# Patient Record
Sex: Female | Born: 1970 | Hispanic: No | Marital: Single | Smoking: Never smoker
Health system: Southern US, Community
[De-identification: ages and names within clinical notes are randomized; demographics above are authoritative.]

## PROBLEM LIST (undated history)

## (undated) DIAGNOSIS — F79 Unspecified intellectual disabilities: Secondary | ICD-10-CM

## (undated) DIAGNOSIS — E079 Disorder of thyroid, unspecified: Secondary | ICD-10-CM

## (undated) DIAGNOSIS — J309 Allergic rhinitis, unspecified: Secondary | ICD-10-CM

## (undated) DIAGNOSIS — E876 Hypokalemia: Secondary | ICD-10-CM

## (undated) DIAGNOSIS — Q8711 Prader-Willi syndrome: Secondary | ICD-10-CM

## (undated) DIAGNOSIS — G4730 Sleep apnea, unspecified: Secondary | ICD-10-CM

## (undated) DIAGNOSIS — E785 Hyperlipidemia, unspecified: Secondary | ICD-10-CM

## (undated) HISTORY — PX: THYROIDECTOMY: SHX17

## (undated) HISTORY — PX: HERNIA REPAIR: SHX51

---

## 1998-04-23 ENCOUNTER — Other Ambulatory Visit: Admission: RE | Admit: 1998-04-23 | Discharge: 1998-04-23 | Payer: Self-pay | Admitting: Family Medicine

## 1998-12-11 ENCOUNTER — Emergency Department (HOSPITAL_COMMUNITY): Admission: EM | Admit: 1998-12-11 | Discharge: 1998-12-11 | Payer: Self-pay | Admitting: Emergency Medicine

## 1998-12-11 ENCOUNTER — Encounter: Payer: Self-pay | Admitting: Emergency Medicine

## 1999-02-22 ENCOUNTER — Encounter: Payer: Self-pay | Admitting: Emergency Medicine

## 1999-02-22 ENCOUNTER — Emergency Department (HOSPITAL_COMMUNITY): Admission: EM | Admit: 1999-02-22 | Discharge: 1999-02-22 | Payer: Self-pay | Admitting: Emergency Medicine

## 1999-02-27 ENCOUNTER — Emergency Department (HOSPITAL_COMMUNITY): Admission: EM | Admit: 1999-02-27 | Discharge: 1999-02-27 | Payer: Self-pay | Admitting: Emergency Medicine

## 2010-05-31 ENCOUNTER — Ambulatory Visit (HOSPITAL_COMMUNITY): Admission: RE | Admit: 2010-05-31 | Discharge: 2010-05-31 | Payer: Self-pay | Admitting: Unknown Physician Specialty

## 2010-11-04 LAB — CBC
HCT: 41.7 % (ref 36.0–46.0)
Hemoglobin: 13.9 g/dL (ref 12.0–15.0)
MCH: 30.9 pg (ref 26.0–34.0)
MCHC: 33.3 g/dL (ref 30.0–36.0)
MCV: 92.7 fL (ref 78.0–100.0)
Platelets: 314 10*3/uL (ref 150–400)
RBC: 4.5 MIL/uL (ref 3.87–5.11)
RDW: 13.1 % (ref 11.5–15.5)
WBC: 7.7 10*3/uL (ref 4.0–10.5)

## 2010-11-04 LAB — BASIC METABOLIC PANEL
BUN: 11 mg/dL (ref 6–23)
CO2: 31 mEq/L (ref 19–32)
Calcium: 9.2 mg/dL (ref 8.4–10.5)
Chloride: 101 mEq/L (ref 96–112)
Creatinine, Ser: 0.58 mg/dL (ref 0.4–1.2)
GFR calc Af Amer: >60 mL/min (ref >60)
GFR calc non Af Amer: >60 mL/min (ref >60)
Glucose, Bld: 109 mg/dL — ABNORMAL HIGH (ref 70–99)
Potassium: 3.8 mEq/L (ref 3.5–5.1)
Sodium: 140 mEq/L (ref 135–145)

## 2011-08-30 DIAGNOSIS — F70 Mild intellectual disabilities: Secondary | ICD-10-CM | POA: Diagnosis not present

## 2011-08-30 DIAGNOSIS — F39 Unspecified mood [affective] disorder: Secondary | ICD-10-CM | POA: Diagnosis not present

## 2011-08-31 DIAGNOSIS — F39 Unspecified mood [affective] disorder: Secondary | ICD-10-CM | POA: Diagnosis not present

## 2011-08-31 DIAGNOSIS — F70 Mild intellectual disabilities: Secondary | ICD-10-CM | POA: Diagnosis not present

## 2011-09-06 DIAGNOSIS — E039 Hypothyroidism, unspecified: Secondary | ICD-10-CM | POA: Diagnosis not present

## 2011-09-06 DIAGNOSIS — E785 Hyperlipidemia, unspecified: Secondary | ICD-10-CM | POA: Diagnosis not present

## 2011-09-06 DIAGNOSIS — Z6838 Body mass index (BMI) 38.0-38.9, adult: Secondary | ICD-10-CM | POA: Diagnosis not present

## 2011-09-06 DIAGNOSIS — E669 Obesity, unspecified: Secondary | ICD-10-CM | POA: Diagnosis not present

## 2011-09-06 DIAGNOSIS — Z23 Encounter for immunization: Secondary | ICD-10-CM | POA: Diagnosis not present

## 2011-09-20 DIAGNOSIS — Z713 Dietary counseling and surveillance: Secondary | ICD-10-CM | POA: Diagnosis not present

## 2011-09-20 DIAGNOSIS — E669 Obesity, unspecified: Secondary | ICD-10-CM | POA: Diagnosis not present

## 2011-10-06 DIAGNOSIS — F39 Unspecified mood [affective] disorder: Secondary | ICD-10-CM | POA: Diagnosis not present

## 2011-10-06 DIAGNOSIS — F70 Mild intellectual disabilities: Secondary | ICD-10-CM | POA: Diagnosis not present

## 2011-10-20 DIAGNOSIS — F39 Unspecified mood [affective] disorder: Secondary | ICD-10-CM | POA: Diagnosis not present

## 2011-10-20 DIAGNOSIS — F70 Mild intellectual disabilities: Secondary | ICD-10-CM | POA: Diagnosis not present

## 2011-11-04 DIAGNOSIS — F39 Unspecified mood [affective] disorder: Secondary | ICD-10-CM | POA: Diagnosis not present

## 2011-11-04 DIAGNOSIS — F70 Mild intellectual disabilities: Secondary | ICD-10-CM | POA: Diagnosis not present

## 2011-12-06 DIAGNOSIS — Q8711 Prader-Willi syndrome: Secondary | ICD-10-CM | POA: Diagnosis not present

## 2011-12-06 DIAGNOSIS — E039 Hypothyroidism, unspecified: Secondary | ICD-10-CM | POA: Diagnosis not present

## 2011-12-06 DIAGNOSIS — Z6837 Body mass index (BMI) 37.0-37.9, adult: Secondary | ICD-10-CM | POA: Diagnosis not present

## 2011-12-06 DIAGNOSIS — I10 Essential (primary) hypertension: Secondary | ICD-10-CM | POA: Diagnosis not present

## 2011-12-09 DIAGNOSIS — F70 Mild intellectual disabilities: Secondary | ICD-10-CM | POA: Diagnosis not present

## 2011-12-09 DIAGNOSIS — F39 Unspecified mood [affective] disorder: Secondary | ICD-10-CM | POA: Diagnosis not present

## 2012-01-13 DIAGNOSIS — F70 Mild intellectual disabilities: Secondary | ICD-10-CM | POA: Diagnosis not present

## 2012-01-13 DIAGNOSIS — F39 Unspecified mood [affective] disorder: Secondary | ICD-10-CM | POA: Diagnosis not present

## 2012-03-02 DIAGNOSIS — F39 Unspecified mood [affective] disorder: Secondary | ICD-10-CM | POA: Diagnosis not present

## 2012-03-02 DIAGNOSIS — F70 Mild intellectual disabilities: Secondary | ICD-10-CM | POA: Diagnosis not present

## 2012-03-12 DIAGNOSIS — Z6836 Body mass index (BMI) 36.0-36.9, adult: Secondary | ICD-10-CM | POA: Diagnosis not present

## 2012-03-12 DIAGNOSIS — I10 Essential (primary) hypertension: Secondary | ICD-10-CM | POA: Diagnosis not present

## 2012-03-12 DIAGNOSIS — E890 Postprocedural hypothyroidism: Secondary | ICD-10-CM | POA: Diagnosis not present

## 2012-03-12 DIAGNOSIS — Q8711 Prader-Willi syndrome: Secondary | ICD-10-CM | POA: Diagnosis not present

## 2012-04-13 DIAGNOSIS — F39 Unspecified mood [affective] disorder: Secondary | ICD-10-CM | POA: Diagnosis not present

## 2012-04-13 DIAGNOSIS — F70 Mild intellectual disabilities: Secondary | ICD-10-CM | POA: Diagnosis not present

## 2012-04-19 DIAGNOSIS — F70 Mild intellectual disabilities: Secondary | ICD-10-CM | POA: Diagnosis not present

## 2012-04-19 DIAGNOSIS — F39 Unspecified mood [affective] disorder: Secondary | ICD-10-CM | POA: Diagnosis not present

## 2012-05-11 DIAGNOSIS — F70 Mild intellectual disabilities: Secondary | ICD-10-CM | POA: Diagnosis not present

## 2012-05-11 DIAGNOSIS — R0609 Other forms of dyspnea: Secondary | ICD-10-CM | POA: Diagnosis not present

## 2012-05-11 DIAGNOSIS — R0989 Other specified symptoms and signs involving the circulatory and respiratory systems: Secondary | ICD-10-CM | POA: Diagnosis not present

## 2012-05-11 DIAGNOSIS — G4730 Sleep apnea, unspecified: Secondary | ICD-10-CM | POA: Diagnosis not present

## 2012-05-11 DIAGNOSIS — F39 Unspecified mood [affective] disorder: Secondary | ICD-10-CM | POA: Diagnosis not present

## 2012-05-11 DIAGNOSIS — J3089 Other allergic rhinitis: Secondary | ICD-10-CM | POA: Diagnosis not present

## 2012-06-14 DIAGNOSIS — E785 Hyperlipidemia, unspecified: Secondary | ICD-10-CM | POA: Diagnosis not present

## 2012-06-14 DIAGNOSIS — Z79899 Other long term (current) drug therapy: Secondary | ICD-10-CM | POA: Diagnosis not present

## 2012-06-14 DIAGNOSIS — I10 Essential (primary) hypertension: Secondary | ICD-10-CM | POA: Diagnosis not present

## 2012-06-14 DIAGNOSIS — E890 Postprocedural hypothyroidism: Secondary | ICD-10-CM | POA: Diagnosis not present

## 2012-06-14 DIAGNOSIS — Z6836 Body mass index (BMI) 36.0-36.9, adult: Secondary | ICD-10-CM | POA: Diagnosis not present

## 2012-06-15 DIAGNOSIS — F70 Mild intellectual disabilities: Secondary | ICD-10-CM | POA: Diagnosis not present

## 2012-06-15 DIAGNOSIS — F39 Unspecified mood [affective] disorder: Secondary | ICD-10-CM | POA: Diagnosis not present

## 2012-06-25 DIAGNOSIS — F39 Unspecified mood [affective] disorder: Secondary | ICD-10-CM | POA: Diagnosis not present

## 2012-06-25 DIAGNOSIS — F70 Mild intellectual disabilities: Secondary | ICD-10-CM | POA: Diagnosis not present

## 2012-07-10 DIAGNOSIS — F39 Unspecified mood [affective] disorder: Secondary | ICD-10-CM | POA: Diagnosis not present

## 2012-07-10 DIAGNOSIS — F70 Mild intellectual disabilities: Secondary | ICD-10-CM | POA: Diagnosis not present

## 2012-07-24 DIAGNOSIS — I890 Lymphedema, not elsewhere classified: Secondary | ICD-10-CM | POA: Diagnosis not present

## 2012-07-24 DIAGNOSIS — E669 Obesity, unspecified: Secondary | ICD-10-CM | POA: Diagnosis not present

## 2012-07-27 DIAGNOSIS — F70 Mild intellectual disabilities: Secondary | ICD-10-CM | POA: Diagnosis not present

## 2012-07-27 DIAGNOSIS — F39 Unspecified mood [affective] disorder: Secondary | ICD-10-CM | POA: Diagnosis not present

## 2012-08-31 DIAGNOSIS — F70 Mild intellectual disabilities: Secondary | ICD-10-CM | POA: Diagnosis not present

## 2012-08-31 DIAGNOSIS — F39 Unspecified mood [affective] disorder: Secondary | ICD-10-CM | POA: Diagnosis not present

## 2012-09-18 DIAGNOSIS — E039 Hypothyroidism, unspecified: Secondary | ICD-10-CM | POA: Diagnosis not present

## 2012-09-18 DIAGNOSIS — Z6837 Body mass index (BMI) 37.0-37.9, adult: Secondary | ICD-10-CM | POA: Diagnosis not present

## 2012-09-18 DIAGNOSIS — I10 Essential (primary) hypertension: Secondary | ICD-10-CM | POA: Diagnosis not present

## 2012-09-18 DIAGNOSIS — E785 Hyperlipidemia, unspecified: Secondary | ICD-10-CM | POA: Diagnosis not present

## 2012-09-18 DIAGNOSIS — E890 Postprocedural hypothyroidism: Secondary | ICD-10-CM | POA: Diagnosis not present

## 2012-09-28 DIAGNOSIS — F39 Unspecified mood [affective] disorder: Secondary | ICD-10-CM | POA: Diagnosis not present

## 2012-09-28 DIAGNOSIS — F70 Mild intellectual disabilities: Secondary | ICD-10-CM | POA: Diagnosis not present

## 2012-10-22 DIAGNOSIS — E669 Obesity, unspecified: Secondary | ICD-10-CM | POA: Diagnosis not present

## 2012-10-22 DIAGNOSIS — Z713 Dietary counseling and surveillance: Secondary | ICD-10-CM | POA: Diagnosis not present

## 2012-10-31 DIAGNOSIS — Z006 Encounter for examination for normal comparison and control in clinical research program: Secondary | ICD-10-CM | POA: Diagnosis not present

## 2012-10-31 DIAGNOSIS — R0609 Other forms of dyspnea: Secondary | ICD-10-CM | POA: Diagnosis not present

## 2012-10-31 DIAGNOSIS — G4730 Sleep apnea, unspecified: Secondary | ICD-10-CM | POA: Diagnosis not present

## 2012-10-31 DIAGNOSIS — R609 Edema, unspecified: Secondary | ICD-10-CM | POA: Diagnosis not present

## 2012-10-31 DIAGNOSIS — J3089 Other allergic rhinitis: Secondary | ICD-10-CM | POA: Diagnosis not present

## 2012-10-31 DIAGNOSIS — R0989 Other specified symptoms and signs involving the circulatory and respiratory systems: Secondary | ICD-10-CM | POA: Diagnosis not present

## 2012-10-31 DIAGNOSIS — G4710 Hypersomnia, unspecified: Secondary | ICD-10-CM | POA: Diagnosis not present

## 2012-11-09 DIAGNOSIS — F70 Mild intellectual disabilities: Secondary | ICD-10-CM | POA: Diagnosis not present

## 2012-11-09 DIAGNOSIS — F39 Unspecified mood [affective] disorder: Secondary | ICD-10-CM | POA: Diagnosis not present

## 2012-11-20 DIAGNOSIS — H5010 Unspecified exotropia: Secondary | ICD-10-CM | POA: Diagnosis not present

## 2012-12-21 DIAGNOSIS — F70 Mild intellectual disabilities: Secondary | ICD-10-CM | POA: Diagnosis not present

## 2012-12-21 DIAGNOSIS — F39 Unspecified mood [affective] disorder: Secondary | ICD-10-CM | POA: Diagnosis not present

## 2013-01-15 DIAGNOSIS — Z79899 Other long term (current) drug therapy: Secondary | ICD-10-CM | POA: Diagnosis not present

## 2013-01-15 DIAGNOSIS — I10 Essential (primary) hypertension: Secondary | ICD-10-CM | POA: Diagnosis not present

## 2013-01-15 DIAGNOSIS — Z6836 Body mass index (BMI) 36.0-36.9, adult: Secondary | ICD-10-CM | POA: Diagnosis not present

## 2013-01-15 DIAGNOSIS — Z0000 Encounter for general adult medical examination without abnormal findings: Secondary | ICD-10-CM | POA: Diagnosis not present

## 2013-01-15 DIAGNOSIS — Z124 Encounter for screening for malignant neoplasm of cervix: Secondary | ICD-10-CM | POA: Diagnosis not present

## 2013-01-15 DIAGNOSIS — E785 Hyperlipidemia, unspecified: Secondary | ICD-10-CM | POA: Diagnosis not present

## 2013-01-15 DIAGNOSIS — E890 Postprocedural hypothyroidism: Secondary | ICD-10-CM | POA: Diagnosis not present

## 2013-01-22 DIAGNOSIS — Z1231 Encounter for screening mammogram for malignant neoplasm of breast: Secondary | ICD-10-CM | POA: Diagnosis not present

## 2013-04-26 DIAGNOSIS — E876 Hypokalemia: Secondary | ICD-10-CM | POA: Diagnosis not present

## 2013-04-26 DIAGNOSIS — I10 Essential (primary) hypertension: Secondary | ICD-10-CM | POA: Diagnosis not present

## 2013-04-26 DIAGNOSIS — E039 Hypothyroidism, unspecified: Secondary | ICD-10-CM | POA: Diagnosis not present

## 2013-04-26 DIAGNOSIS — E890 Postprocedural hypothyroidism: Secondary | ICD-10-CM | POA: Diagnosis not present

## 2013-04-26 DIAGNOSIS — Q8711 Prader-Willi syndrome: Secondary | ICD-10-CM | POA: Diagnosis not present

## 2013-05-15 DIAGNOSIS — J309 Allergic rhinitis, unspecified: Secondary | ICD-10-CM | POA: Diagnosis not present

## 2013-05-15 DIAGNOSIS — R0609 Other forms of dyspnea: Secondary | ICD-10-CM | POA: Diagnosis not present

## 2013-05-15 DIAGNOSIS — G4710 Hypersomnia, unspecified: Secondary | ICD-10-CM | POA: Diagnosis not present

## 2013-05-21 DIAGNOSIS — G4710 Hypersomnia, unspecified: Secondary | ICD-10-CM | POA: Diagnosis not present

## 2013-05-28 DIAGNOSIS — G4710 Hypersomnia, unspecified: Secondary | ICD-10-CM | POA: Diagnosis not present

## 2013-05-28 DIAGNOSIS — J309 Allergic rhinitis, unspecified: Secondary | ICD-10-CM | POA: Diagnosis not present

## 2013-05-28 DIAGNOSIS — R0609 Other forms of dyspnea: Secondary | ICD-10-CM | POA: Diagnosis not present

## 2013-05-28 DIAGNOSIS — Z23 Encounter for immunization: Secondary | ICD-10-CM | POA: Diagnosis not present

## 2013-05-28 DIAGNOSIS — G4730 Sleep apnea, unspecified: Secondary | ICD-10-CM | POA: Diagnosis not present

## 2013-06-04 DIAGNOSIS — G4710 Hypersomnia, unspecified: Secondary | ICD-10-CM | POA: Diagnosis not present

## 2013-06-11 DIAGNOSIS — G4710 Hypersomnia, unspecified: Secondary | ICD-10-CM | POA: Diagnosis not present

## 2013-06-11 DIAGNOSIS — R0609 Other forms of dyspnea: Secondary | ICD-10-CM | POA: Diagnosis not present

## 2013-06-11 DIAGNOSIS — J309 Allergic rhinitis, unspecified: Secondary | ICD-10-CM | POA: Diagnosis not present

## 2013-07-15 DIAGNOSIS — G4710 Hypersomnia, unspecified: Secondary | ICD-10-CM | POA: Diagnosis not present

## 2013-07-15 DIAGNOSIS — J309 Allergic rhinitis, unspecified: Secondary | ICD-10-CM | POA: Diagnosis not present

## 2013-07-15 DIAGNOSIS — R0609 Other forms of dyspnea: Secondary | ICD-10-CM | POA: Diagnosis not present

## 2013-07-16 DIAGNOSIS — G4710 Hypersomnia, unspecified: Secondary | ICD-10-CM | POA: Diagnosis not present

## 2013-07-26 DIAGNOSIS — E785 Hyperlipidemia, unspecified: Secondary | ICD-10-CM | POA: Diagnosis not present

## 2013-07-26 DIAGNOSIS — E890 Postprocedural hypothyroidism: Secondary | ICD-10-CM | POA: Diagnosis not present

## 2013-07-26 DIAGNOSIS — Z6836 Body mass index (BMI) 36.0-36.9, adult: Secondary | ICD-10-CM | POA: Diagnosis not present

## 2013-07-26 DIAGNOSIS — I10 Essential (primary) hypertension: Secondary | ICD-10-CM | POA: Diagnosis not present

## 2013-07-26 DIAGNOSIS — Z23 Encounter for immunization: Secondary | ICD-10-CM | POA: Diagnosis not present

## 2013-10-01 DIAGNOSIS — E669 Obesity, unspecified: Secondary | ICD-10-CM | POA: Diagnosis not present

## 2013-10-01 DIAGNOSIS — I890 Lymphedema, not elsewhere classified: Secondary | ICD-10-CM | POA: Diagnosis not present

## 2013-10-01 DIAGNOSIS — Z6837 Body mass index (BMI) 37.0-37.9, adult: Secondary | ICD-10-CM | POA: Diagnosis not present

## 2013-10-29 DIAGNOSIS — I10 Essential (primary) hypertension: Secondary | ICD-10-CM | POA: Diagnosis not present

## 2013-10-29 DIAGNOSIS — R0989 Other specified symptoms and signs involving the circulatory and respiratory systems: Secondary | ICD-10-CM | POA: Diagnosis not present

## 2013-10-29 DIAGNOSIS — Z6836 Body mass index (BMI) 36.0-36.9, adult: Secondary | ICD-10-CM | POA: Diagnosis not present

## 2013-10-29 DIAGNOSIS — H902 Conductive hearing loss, unspecified: Secondary | ICD-10-CM | POA: Diagnosis not present

## 2013-10-29 DIAGNOSIS — R0609 Other forms of dyspnea: Secondary | ICD-10-CM | POA: Diagnosis not present

## 2013-10-29 DIAGNOSIS — G4710 Hypersomnia, unspecified: Secondary | ICD-10-CM | POA: Diagnosis not present

## 2013-10-29 DIAGNOSIS — H6120 Impacted cerumen, unspecified ear: Secondary | ICD-10-CM | POA: Diagnosis not present

## 2013-10-29 DIAGNOSIS — E890 Postprocedural hypothyroidism: Secondary | ICD-10-CM | POA: Diagnosis not present

## 2013-10-29 DIAGNOSIS — G4730 Sleep apnea, unspecified: Secondary | ICD-10-CM | POA: Diagnosis not present

## 2013-10-29 DIAGNOSIS — J309 Allergic rhinitis, unspecified: Secondary | ICD-10-CM | POA: Diagnosis not present

## 2014-02-05 DIAGNOSIS — E890 Postprocedural hypothyroidism: Secondary | ICD-10-CM | POA: Diagnosis not present

## 2014-02-05 DIAGNOSIS — I10 Essential (primary) hypertension: Secondary | ICD-10-CM | POA: Diagnosis not present

## 2014-02-05 DIAGNOSIS — Z6836 Body mass index (BMI) 36.0-36.9, adult: Secondary | ICD-10-CM | POA: Diagnosis not present

## 2014-02-05 DIAGNOSIS — Q8711 Prader-Willi syndrome: Secondary | ICD-10-CM | POA: Diagnosis not present

## 2014-02-11 DIAGNOSIS — Z1231 Encounter for screening mammogram for malignant neoplasm of breast: Secondary | ICD-10-CM | POA: Diagnosis not present

## 2014-02-18 ENCOUNTER — Emergency Department (HOSPITAL_COMMUNITY): Payer: Medicare Other

## 2014-02-18 ENCOUNTER — Encounter (HOSPITAL_COMMUNITY): Payer: Self-pay | Admitting: Emergency Medicine

## 2014-02-18 ENCOUNTER — Emergency Department (HOSPITAL_COMMUNITY)
Admission: EM | Admit: 2014-02-18 | Discharge: 2014-02-18 | Disposition: A | Discharge status: Home / Self Care | Payer: Medicare Other | Attending: Emergency Medicine | Admitting: Emergency Medicine

## 2014-02-18 DIAGNOSIS — Y9210 Unspecified residential institution as the place of occurrence of the external cause: Secondary | ICD-10-CM | POA: Insufficient documentation

## 2014-02-18 DIAGNOSIS — S99919A Unspecified injury of unspecified ankle, initial encounter: Principal | ICD-10-CM

## 2014-02-18 DIAGNOSIS — W010XXA Fall on same level from slipping, tripping and stumbling without subsequent striking against object, initial encounter: Secondary | ICD-10-CM | POA: Diagnosis not present

## 2014-02-18 DIAGNOSIS — S8990XA Unspecified injury of unspecified lower leg, initial encounter: Secondary | ICD-10-CM | POA: Diagnosis not present

## 2014-02-18 DIAGNOSIS — G40909 Epilepsy, unspecified, not intractable, without status epilepticus: Secondary | ICD-10-CM | POA: Diagnosis not present

## 2014-02-18 DIAGNOSIS — Y939 Activity, unspecified: Secondary | ICD-10-CM | POA: Insufficient documentation

## 2014-02-18 DIAGNOSIS — M25562 Pain in left knee: Secondary | ICD-10-CM

## 2014-02-18 DIAGNOSIS — M259 Joint disorder, unspecified: Secondary | ICD-10-CM | POA: Diagnosis not present

## 2014-02-18 DIAGNOSIS — M25569 Pain in unspecified knee: Secondary | ICD-10-CM | POA: Diagnosis not present

## 2014-02-18 DIAGNOSIS — M79609 Pain in unspecified limb: Secondary | ICD-10-CM | POA: Diagnosis not present

## 2014-02-18 DIAGNOSIS — M949 Disorder of cartilage, unspecified: Secondary | ICD-10-CM | POA: Diagnosis not present

## 2014-02-18 DIAGNOSIS — S99929A Unspecified injury of unspecified foot, initial encounter: Principal | ICD-10-CM

## 2014-02-18 DIAGNOSIS — M899 Disorder of bone, unspecified: Secondary | ICD-10-CM | POA: Diagnosis not present

## 2014-02-18 HISTORY — DX: Unspecified intellectual disabilities: F79

## 2014-02-18 HISTORY — DX: Hypokalemia: E87.6

## 2014-02-18 HISTORY — DX: Sleep apnea, unspecified: G47.30

## 2014-02-18 HISTORY — DX: Hyperlipidemia, unspecified: E78.5

## 2014-02-18 HISTORY — DX: Allergic rhinitis, unspecified: J30.9

## 2014-02-18 HISTORY — DX: Prader-Willi syndrome: Q87.11

## 2014-02-18 HISTORY — DX: Disorder of thyroid, unspecified: E07.9

## 2014-02-18 MED ORDER — ACETAMINOPHEN 325 MG PO TABS
650.0000 mg | ORAL_TABLET | Freq: Once | ORAL | Status: AC
  Administered 2014-02-18: 650 mg via ORAL
  Filled 2014-02-18: qty 2

## 2014-02-18 NOTE — Progress Notes (Signed)
  CARE MANAGEMENT ED NOTE 02/18/2014  Patient:  Barbara Ward,Barbara Ward   Account Number:  [id number]  Date Initiated:  02/18/2014  Documentation initiated by:  Radford PaxFERRERO,AMY  Subjective/Objective Assessment:   Paktient presents to Ed with fall with pain to left knee     Subjective/Objective Assessment Detail:     Action/Plan:   Action/Plan Detail:   Anticipated DC Date:  02/18/2014     Status Recommendation to Physician:   Result of Recommendation:    Other ED Services  Consult Working Plan    DC Planning Services  Other  PCP issues    Choice offered to / List presented to:            Status of service:  Completed, signed off  ED Comments:   ED Comments Detail:  EDCM spoke to patient at bedside.  Patient reports her pcp is Dr. [place]e [place]Amy [place].  System updated.

## 2014-02-18 NOTE — ED Notes (Signed)
Per EMS, pt from day facility.  Pt originally from [address] [facility].  Pt was ambulating with walker.  Pt fell to buttocks and is now c/o left knee. Pt fall witnessed.  No head trauma.  No LOC.  Oriented to baseline.  Facility sitter/caregiver on the way to hospital for additional data.  Vitals:  112/78, hr 88, resp 18.

## 2014-02-18 NOTE — ED Notes (Addendum)
Pt waiting for PTAR 

## 2014-02-18 NOTE — ED Notes (Signed)
Bed: [id number] Expected date:  Expected time:  Means of arrival:  Comments: EMS- fall, knee pain

## 2014-02-18 NOTE — Discharge Instructions (Signed)
Take tylenol/advil as need. Follow up with your doctor in 1 week if symptoms fail to improve/resolve. Return to ER if worse, new symptoms, severe pain, increased swelling, other concern.   Fall Prevention and Home Safety Falls cause injuries and can affect all age groups. It is possible to use preventive measures to significantly decrease the likelihood of falls. There are many simple measures which can make your home safer and prevent falls. OUTDOORS  Repair cracks and edges of walkways and driveways.  Remove high doorway thresholds.  Trim shrubbery on the main path into your home.  Have good outside lighting.  Clear walkways of tools, rocks, debris, and clutter.  Check that handrails are not broken and are securely fastened. Both sides of steps should have handrails.  Have leaves, snow, and ice cleared regularly.  Use sand or salt on walkways during winter months.  In the garage, clean up grease or oil spills. BATHROOM  Install night lights.  Install grab bars by the toilet and in the tub and shower.  Use non-skid mats or decals in the tub or shower.  Place a plastic non-slip stool in the shower to sit on, if needed.  Keep floors dry and clean up all water on the floor immediately.  Remove soap buildup in the tub or shower on a regular basis.  Secure bath mats with non-slip, double-sided rug tape.  Remove throw rugs and tripping hazards from the floors. BEDROOMS  Install night lights.  Make sure a bedside light is easy to reach.  Do not use oversized bedding.  Keep a telephone by your bedside.  Have a firm chair with side arms to use for getting dressed.  Remove throw rugs and tripping hazards from the floor. KITCHEN  Keep handles on pots and pans turned toward the center of the stove. Use back burners when possible.  Clean up spills quickly and allow time for drying.  Avoid walking on wet floors.  Avoid hot utensils and knives.  Position shelves so  they are not too high or low.  Place commonly used objects within easy reach.  If necessary, use a sturdy step stool with a grab bar when reaching.  Keep electrical cables out of the way.  Do not use floor polish or wax that makes floors slippery. If you must use wax, use non-skid floor wax.  Remove throw rugs and tripping hazards from the floor. STAIRWAYS  Never leave objects on stairs.  Place handrails on both sides of stairways and use them. Fix any loose handrails. Make sure handrails on both sides of the stairways are as long as the stairs.  Check carpeting to make sure it is firmly attached along stairs. Make repairs to worn or loose carpet promptly.  Avoid placing throw rugs at the top or bottom of stairways, or properly secure the rug with carpet tape to prevent slippage. Get rid of throw rugs, if possible.  Have an electrician put in a light switch at the top and bottom of the stairs. OTHER FALL PREVENTION TIPS  Wear low-heel or rubber-soled shoes that are supportive and fit well. Wear closed toe shoes.  When using a stepladder, make sure it is fully opened and both spreaders are firmly locked. Do not climb a closed stepladder.  Add color or contrast paint or tape to grab bars and handrails in your home. Place contrasting color strips on first and last steps.  Learn and use mobility aids as needed. Install an electrical emergency response system.  Turn on lights to avoid dark areas. Replace light bulbs that burn out immediately. Get light switches that glow.  Arrange furniture to create clear pathways. Keep furniture in the same place.  Firmly attach carpet with non-skid or double-sided tape.  Eliminate uneven floor surfaces.  Select a carpet pattern that does not visually hide the edge of steps.  Be aware of all pets. OTHER HOME SAFETY TIPS  Set the water temperature for 120 F (48.8 C).  Keep emergency numbers on or near the telephone.  Keep smoke  detectors on every level of the home and near sleeping areas. Document Released: 07/29/2002 Document Revised: 02/07/2012 Document Reviewed: 10/28/2011 [facility] Patient Information 2015 [place], [place]. This information is not intended to replace advice given to you by your health care provider. Make sure you discuss any questions you have with your health care provider.    Knee Pain The knee is the complex joint between your thigh and your lower leg. It is made up of bones, tendons, ligaments, and cartilage. The bones that make up the knee are:  The femur in the thigh.  The tibia and fibula in the lower leg.  The patella or kneecap riding in the groove on the lower femur. CAUSES  Knee pain is a common complaint with many causes. A few of these causes are:  Injury, such as:  A ruptured ligament or tendon injury.  Torn cartilage.  Medical conditions, such as:  Gout  Arthritis  Infections  Overuse, over training, or overdoing a physical activity. Knee pain can be minor or severe. Knee pain can accompany debilitating injury. Minor knee problems often respond well to self-care measures or get well on their own. More serious injuries may need medical intervention or even surgery. SYMPTOMS The knee is complex. Symptoms of knee problems can vary widely. Some of the problems are:  Pain with movement and weight bearing.  Swelling and tenderness.  Buckling of the knee.  Inability to straighten or extend your knee.  Your knee locks and you cannot straighten it.  Warmth and redness with pain and fever.  Deformity or dislocation of the kneecap. DIAGNOSIS  Determining what is wrong may be very straight forward such as when there is an injury. It can also be challenging because of the complexity of the knee. Tests to make a diagnosis may include:  Your caregiver taking a history and doing a physical exam.  Routine X-rays can be used to rule out other problems. X-rays will not  reveal a cartilage tear. Some injuries of the knee can be diagnosed by:  Arthroscopy a surgical technique by which a small video camera is inserted through tiny incisions on the sides of the knee. This procedure is used to examine and repair internal knee joint problems. Tiny instruments can be used during arthroscopy to repair the torn knee cartilage (meniscus).  Arthrography is a radiology technique. A contrast liquid is directly injected into the knee joint. Internal structures of the knee joint then become visible on X-ray film.  An MRI scan is a non X-ray radiology procedure in which magnetic fields and a computer produce two- or three-dimensional images of the inside of the knee. Cartilage tears are often visible using an MRI scanner. MRI scans have largely replaced arthrography in diagnosing cartilage tears of the knee.  Blood work.  Examination of the fluid that helps to lubricate the knee joint (synovial fluid). This is done by taking a sample out using a needle and a syringe. TREATMENT  The treatment of knee problems depends on the cause. Some of these treatments are:  Depending on the injury, proper casting, splinting, surgery, or physical therapy care will be needed.  Give yourself adequate recovery time. Do not overuse your joints. If you begin to get sore during workout routines, back off. Slow down or do fewer repetitions.  For repetitive activities such as cycling or running, maintain your strength and nutrition.  Alternate muscle groups. For example, if you are a weight lifter, work the upper body on one day and the lower body the next.  Either tight or weak muscles do not give the proper support for your knee. Tight or weak muscles do not absorb the stress placed on the knee joint. Keep the muscles surrounding the knee strong.  Take care of mechanical problems.  If you have flat feet, orthotics or special shoes may help. See your caregiver if you need help.  Arch  supports, sometimes with wedges on the inner or outer aspect of the heel, can help. These can shift pressure away from the side of the knee most bothered by osteoarthritis.  A brace called an "unloader" brace also may be used to help ease the pressure on the most arthritic side of the knee.  If your caregiver has prescribed crutches, braces, wraps or ice, use as directed. The acronym for this is PRICE. This means protection, rest, ice, compression, and elevation.  Nonsteroidal anti-inflammatory drugs (NSAIDs), can help relieve pain. But if taken immediately after an injury, they may actually increase swelling. Take NSAIDs with food in your stomach. Stop them if you develop stomach problems. Do not take these if you have a history of ulcers, stomach pain, or bleeding from the bowel. Do not take without your caregiver's approval if you have problems with fluid retention, heart failure, or kidney problems.  For ongoing knee problems, physical therapy may be helpful.  Glucosamine and chondroitin are over-the-counter dietary supplements. Both may help relieve the pain of osteoarthritis in the knee. These medicines are different from the usual anti-inflammatory drugs. Glucosamine may decrease the rate of cartilage destruction.  Injections of a corticosteroid drug into your knee joint may help reduce the symptoms of an arthritis flare-up. They may provide pain relief that lasts a few months. You may have to wait a few months between injections. The injections do have a small increased risk of infection, water retention, and elevated blood sugar levels.  Hyaluronic acid injected into damaged joints may ease pain and provide lubrication. These injections may work by reducing inflammation. A series of shots may give relief for as long as 6 months.  Topical painkillers. Applying certain ointments to your skin may help relieve the pain and stiffness of osteoarthritis. Ask your pharmacist for suggestions. Many  over the-counter products are approved for temporary relief of arthritis pain.  In some countries, doctors often prescribe topical NSAIDs for relief of chronic conditions such as arthritis and tendinitis. A review of treatment with NSAID creams found that they worked as well as oral medications but without the serious side effects. PREVENTION  Maintain a healthy weight. Extra pounds put more strain on your joints.  Get strong, stay limber. Weak muscles are a common cause of knee injuries. Stretching is important. Include flexibility exercises in your workouts.  Be smart about exercise. If you have osteoarthritis, chronic knee pain or recurring injuries, you may need to change the way you exercise. This does not mean you have to stop being active. If your  knees ache after jogging or playing basketball, consider switching to swimming, water aerobics, or other low-impact activities, at least for a few days a week. Sometimes limiting high-impact activities will provide relief.  Make sure your shoes fit well. Choose footwear that is right for your sport.  Protect your knees. Use the proper gear for knee-sensitive activities. Use kneepads when playing volleyball or laying carpet. Buckle your seat belt every time you drive. Most shattered kneecaps occur in car accidents.  Rest when you are tired. SEEK MEDICAL CARE IF:  You have knee pain that is continual and does not seem to be getting better.  SEEK IMMEDIATE MEDICAL CARE IF:  Your knee joint feels hot to the touch and you have a high fever. MAKE SURE YOU:   Understand these instructions.  Will watch your condition.  Will get help right away if you are not doing well or get worse. Document Released: 06/05/2007 Document Revised: 10/31/2011 Document Reviewed: 06/05/2007 [facility] Patient Information 2015 [place]. This information is not intended to replace advice given to you by your health care provider. Make sure you discuss any  questions you have with your health care provider.   Knee Sprain A knee sprain is a tear in one of the strong, fibrous tissues that connect the bones (ligaments) in your knee. The severity of the sprain depends on how much of the ligament is torn. The tear can be either partial or complete. CAUSES  Often, sprains are a result of a fall or injury. The force of the impact causes the fibers of your ligament to stretch too much. This excess tension causes the fibers of your ligament to tear. SIGNS AND SYMPTOMS  You may have some loss of motion in your knee. Other symptoms include:  Bruising.  Pain in the knee area.  Tenderness of the knee to the touch.  Swelling. DIAGNOSIS  To diagnose a knee sprain, your health care provider will physically examine your knee. Your health care provider may also suggest an X-ray exam of your knee to make sure no bones are broken. TREATMENT  If your ligament is only partially torn, treatment usually involves keeping the knee in a fixed position (immobilization) or bracing your knee for activities that require movement for several weeks. To do this, your health care provider will apply a bandage, cast, or splint to keep your knee from moving and to support your knee during movement until it heals. For a partially torn ligament, the healing process usually takes 4-6 weeks. If your ligament is completely torn, depending on which ligament it is, you may need surgery to reconnect the ligament to the bone or reconstruct it. After surgery, a cast or splint may be applied and will need to stay on your knee for 4-6 weeks while your ligament heals. HOME CARE INSTRUCTIONS  Keep your injured knee elevated to decrease swelling.  To ease pain and swelling, apply ice to the injured area:  Put ice in a plastic bag.  Place a towel between your skin and the bag.  Leave the ice on for 20 minutes, 2-3 times a day.  Only take medicine for pain as directed by your health care  provider.  Do not leave your knee unprotected until pain and stiffness go away (usually 4-6 weeks).  If you have a cast or splint, do not allow it to get wet. If you have been instructed not to remove it, cover it with a plastic bag when you shower or bathe. Do  not swim.  Your health care provider may suggest exercises for you to do during your recovery to prevent or limit permanent weakness and stiffness. SEEK IMMEDIATE MEDICAL CARE IF:  Your cast or splint becomes damaged.  Your pain becomes worse.  You have significant pain, swelling, or numbness below the cast or splint. MAKE SURE YOU:  Understand these instructions.  Will watch your condition.  Will get help right away if you are not doing well or get worse. Document Released: 08/08/2005 Document Revised: 05/29/2013 Document Reviewed: 03/20/2013 [facility] Patient Information 2015 [place], [place]. This information is not intended to replace advice given to you by your health care provider. Make sure you discuss any questions you have with your health care provider.

## 2014-02-18 NOTE — ED Provider Notes (Addendum)
CSN: [id number]     Arrival date & time 02/18/14  1326 History   First MD Initiated Contact with Patient 02/18/14 1332     Chief Complaint  Patient presents with  . Knee Pain     (Consider location/radiation/quality/duration/timing/severity/associated sxs/prior Treatment) Patient is a 43 y.o. female presenting with knee pain. The history is provided by the patient. The history is limited by the condition of the patient.  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  No loc w fall. pts mental status has remained c/w baseline since fall.  Pt not verbally conversant/communicating - appears to indicate pain in region left upper leg - level 5 caveat.     No past medical history on file. No past surgical history on file. No family history on file. History  Substance Use Topics  . Smoking status: Not on file  . Smokeless tobacco: Not on file  . Alcohol Use: Not on file   OB History   No data available     Review of Systems  Unable to perform ROS: Patient nonverbal  level 5 caveat/MR/doesnt communicate verbally at baseline      Allergies  Review of patient's allergies indicates not on file.  Home Medications   Prior to Admission medications   Not on File   BP 99/42  Pulse 86  Temp(Src) 98.1 F (36.7 C) (Oral)  Resp 18  SpO2 100% Physical Exam  Nursing note and vitals reviewed. Constitutional: She appears well-developed and well-nourished. No distress.  Morbidly obese, esp from waist through lower ext  HENT:  Head: Atraumatic.  Eyes: Conjunctivae are normal. Pupils are equal, round, and reactive to light. No scleral icterus.  Neck: Neck supple. No tracheal deviation present.  Cardiovascular: Normal rate, regular rhythm, normal heart sounds and intact distal pulses.   Pulmonary/Chest: Effort normal and breath sounds normal. No respiratory distress. She exhibits  no tenderness.  Abdominal: Soft. Normal appearance and bowel sounds are normal. She exhibits no distension. There is no tenderness.  Musculoskeletal: She exhibits no edema.  Pt resists any attempt at rom at left hip or knee. Apparent tenderness from left hip to knee, ?esp at knee. Knee grossly stable, no effusion.  No lower leg or ankle tenderness. Distal pulses palp.  No other apparent pain, sts, or tenderness on bil ext exam. CTLS spine, non tender, aligned, no step off.   Neurological: She is alert.  Awake and alert. Moaning. Mental status described as c/w baseline.  Pt resists any attempt to move left leg, will not follow commands. Spontaneously moves bil upper ext purposefully.   Skin: Skin is warm and dry. No rash noted. She is not diaphoretic.  Psychiatric: She has a normal mood and affect.    ED Course  Procedures (including critical care time) Labs Review  Dg Pelvis 1-2 Views  02/18/2014   CLINICAL DATA:  Left hip pain  EXAM: PELVIS - 1-2 VIEW  COMPARISON:  None.  FINDINGS: Single frontal view of the pelvis submitted. No displaced fracture or subluxation. Suboptimal study due to positioning.  IMPRESSION: No displaced fracture or subluxation.   Electronically Signed   By: Natasha MeadLiviu  Pop M.D.   On: 02/18/2014 14:53   Dg Femur Left  02/18/2014   CLINICAL DATA:  Pain  EXAM: LEFT FEMUR - 2 VIEW  COMPARISON:  None.  FINDINGS: Four views of left femur submitted. There is diffuse  osteopenia. No gross fracture or subluxation.  IMPRESSION: Diffuse osteopenia.  No gross fracture or subluxation.   Electronically Signed   By: Natasha MeadLiviu  Pop M.D.   On: 02/18/2014 15:01   Dg Knee Complete 4 Views Left  02/18/2014   CLINICAL DATA:  Left knee pain  EXAM: LEFT KNEE - COMPLETE 4+ VIEW  COMPARISON:  None.  FINDINGS: Three views of the left knee submitted. There is diffuse osteopenia. No gross fracture or subluxation.  IMPRESSION: Diffuse osteopenia.  No gross fracture or subluxation.   Electronically Signed   By:  Natasha MeadLiviu  Pop M.D.   On: 02/18/2014 15:00     MDM  Xrays.  Reviewed nursing notes and prior charts for additional history.   Tylenol po.  Recheck pt calm and alert. Staff from her facility indicate mental status c/w baseline. Pain controlled/relieved.   No consistent/persistent focal bony tenderness. Distal pulses palp. No visible bruising, contusion, or sts as compared to initial exam.     Suzi RootsKevin E Steinl, MD 02/18/14 1517

## 2014-02-19 DIAGNOSIS — M25569 Pain in unspecified knee: Secondary | ICD-10-CM | POA: Diagnosis not present

## 2014-02-25 DIAGNOSIS — M25569 Pain in unspecified knee: Secondary | ICD-10-CM | POA: Diagnosis not present

## 2014-03-11 DIAGNOSIS — M25569 Pain in unspecified knee: Secondary | ICD-10-CM | POA: Diagnosis not present

## 2014-05-13 DIAGNOSIS — I10 Essential (primary) hypertension: Secondary | ICD-10-CM | POA: Diagnosis not present

## 2014-05-13 DIAGNOSIS — E785 Hyperlipidemia, unspecified: Secondary | ICD-10-CM | POA: Diagnosis not present

## 2014-05-13 DIAGNOSIS — Z6836 Body mass index (BMI) 36.0-36.9, adult: Secondary | ICD-10-CM | POA: Diagnosis not present

## 2014-05-13 DIAGNOSIS — E890 Postprocedural hypothyroidism: Secondary | ICD-10-CM | POA: Diagnosis not present

## 2014-06-24 DIAGNOSIS — F411 Generalized anxiety disorder: Secondary | ICD-10-CM | POA: Diagnosis not present

## 2014-06-24 DIAGNOSIS — Z23 Encounter for immunization: Secondary | ICD-10-CM | POA: Diagnosis not present

## 2014-06-24 DIAGNOSIS — I10 Essential (primary) hypertension: Secondary | ICD-10-CM | POA: Diagnosis not present

## 2014-06-24 DIAGNOSIS — Z6838 Body mass index (BMI) 38.0-38.9, adult: Secondary | ICD-10-CM | POA: Diagnosis not present

## 2014-10-23 DIAGNOSIS — E785 Hyperlipidemia, unspecified: Secondary | ICD-10-CM | POA: Diagnosis not present

## 2014-10-23 DIAGNOSIS — Q871 Congenital malformation syndromes predominantly associated with short stature: Secondary | ICD-10-CM | POA: Diagnosis not present

## 2014-10-23 DIAGNOSIS — G4710 Hypersomnia, unspecified: Secondary | ICD-10-CM | POA: Diagnosis not present

## 2014-10-23 DIAGNOSIS — I10 Essential (primary) hypertension: Secondary | ICD-10-CM | POA: Diagnosis not present

## 2014-10-23 DIAGNOSIS — E890 Postprocedural hypothyroidism: Secondary | ICD-10-CM | POA: Diagnosis not present

## 2014-10-23 DIAGNOSIS — I890 Lymphedema, not elsewhere classified: Secondary | ICD-10-CM | POA: Diagnosis not present

## 2014-10-23 DIAGNOSIS — Z1231 Encounter for screening mammogram for malignant neoplasm of breast: Secondary | ICD-10-CM | POA: Diagnosis not present

## 2014-10-23 DIAGNOSIS — Z6830 Body mass index (BMI) 30.0-30.9, adult: Secondary | ICD-10-CM | POA: Diagnosis not present

## 2014-10-23 DIAGNOSIS — Z0000 Encounter for general adult medical examination without abnormal findings: Secondary | ICD-10-CM | POA: Diagnosis not present

## 2015-01-27 DIAGNOSIS — Q871 Congenital malformation syndromes predominantly associated with short stature: Secondary | ICD-10-CM | POA: Diagnosis not present

## 2015-01-27 DIAGNOSIS — Z9181 History of falling: Secondary | ICD-10-CM | POA: Diagnosis not present

## 2015-01-27 DIAGNOSIS — I10 Essential (primary) hypertension: Secondary | ICD-10-CM | POA: Diagnosis not present

## 2015-01-27 DIAGNOSIS — Z6836 Body mass index (BMI) 36.0-36.9, adult: Secondary | ICD-10-CM | POA: Diagnosis not present

## 2015-01-27 DIAGNOSIS — I890 Lymphedema, not elsewhere classified: Secondary | ICD-10-CM | POA: Diagnosis not present

## 2015-01-27 DIAGNOSIS — Z1389 Encounter for screening for other disorder: Secondary | ICD-10-CM | POA: Diagnosis not present

## 2015-01-27 DIAGNOSIS — E785 Hyperlipidemia, unspecified: Secondary | ICD-10-CM | POA: Diagnosis not present

## 2015-01-27 DIAGNOSIS — E890 Postprocedural hypothyroidism: Secondary | ICD-10-CM | POA: Diagnosis not present

## 2015-02-18 DIAGNOSIS — Z1231 Encounter for screening mammogram for malignant neoplasm of breast: Secondary | ICD-10-CM | POA: Diagnosis not present

## 2015-03-05 DIAGNOSIS — R928 Other abnormal and inconclusive findings on diagnostic imaging of breast: Secondary | ICD-10-CM | POA: Diagnosis not present

## 2015-03-09 DIAGNOSIS — N6011 Diffuse cystic mastopathy of right breast: Secondary | ICD-10-CM | POA: Diagnosis not present

## 2015-03-09 DIAGNOSIS — R928 Other abnormal and inconclusive findings on diagnostic imaging of breast: Secondary | ICD-10-CM | POA: Diagnosis not present

## 2015-03-09 DIAGNOSIS — N63 Unspecified lump in breast: Secondary | ICD-10-CM | POA: Diagnosis not present

## 2015-05-05 DIAGNOSIS — I10 Essential (primary) hypertension: Secondary | ICD-10-CM | POA: Diagnosis not present

## 2015-05-05 DIAGNOSIS — E890 Postprocedural hypothyroidism: Secondary | ICD-10-CM | POA: Diagnosis not present

## 2015-05-05 DIAGNOSIS — G4710 Hypersomnia, unspecified: Secondary | ICD-10-CM | POA: Diagnosis not present

## 2015-05-05 DIAGNOSIS — E785 Hyperlipidemia, unspecified: Secondary | ICD-10-CM | POA: Diagnosis not present

## 2015-05-05 DIAGNOSIS — F411 Generalized anxiety disorder: Secondary | ICD-10-CM | POA: Diagnosis not present

## 2015-05-05 DIAGNOSIS — Q871 Congenital malformation syndromes predominantly associated with short stature: Secondary | ICD-10-CM | POA: Diagnosis not present

## 2015-05-05 DIAGNOSIS — Z6837 Body mass index (BMI) 37.0-37.9, adult: Secondary | ICD-10-CM | POA: Diagnosis not present

## 2015-08-07 DIAGNOSIS — Z23 Encounter for immunization: Secondary | ICD-10-CM | POA: Diagnosis not present

## 2015-08-07 DIAGNOSIS — I10 Essential (primary) hypertension: Secondary | ICD-10-CM | POA: Diagnosis not present

## 2015-08-07 DIAGNOSIS — F411 Generalized anxiety disorder: Secondary | ICD-10-CM | POA: Diagnosis not present

## 2015-08-07 DIAGNOSIS — E890 Postprocedural hypothyroidism: Secondary | ICD-10-CM | POA: Diagnosis not present

## 2015-08-07 DIAGNOSIS — Q871 Congenital malformation syndromes predominantly associated with short stature: Secondary | ICD-10-CM | POA: Diagnosis not present

## 2015-08-07 DIAGNOSIS — Z1389 Encounter for screening for other disorder: Secondary | ICD-10-CM | POA: Diagnosis not present

## 2015-08-07 DIAGNOSIS — Z6836 Body mass index (BMI) 36.0-36.9, adult: Secondary | ICD-10-CM | POA: Diagnosis not present

## 2015-10-19 DIAGNOSIS — Q871 Congenital malformation syndromes predominantly associated with short stature: Secondary | ICD-10-CM | POA: Diagnosis not present

## 2015-10-19 DIAGNOSIS — I10 Essential (primary) hypertension: Secondary | ICD-10-CM | POA: Diagnosis not present

## 2015-10-19 DIAGNOSIS — Z6837 Body mass index (BMI) 37.0-37.9, adult: Secondary | ICD-10-CM | POA: Diagnosis not present

## 2015-10-19 DIAGNOSIS — E785 Hyperlipidemia, unspecified: Secondary | ICD-10-CM | POA: Diagnosis not present

## 2015-10-19 DIAGNOSIS — I890 Lymphedema, not elsewhere classified: Secondary | ICD-10-CM | POA: Diagnosis not present

## 2015-10-19 DIAGNOSIS — G4710 Hypersomnia, unspecified: Secondary | ICD-10-CM | POA: Diagnosis not present

## 2015-10-19 DIAGNOSIS — F411 Generalized anxiety disorder: Secondary | ICD-10-CM | POA: Diagnosis not present

## 2016-01-21 DIAGNOSIS — F411 Generalized anxiety disorder: Secondary | ICD-10-CM | POA: Diagnosis not present

## 2016-01-21 DIAGNOSIS — E785 Hyperlipidemia, unspecified: Secondary | ICD-10-CM | POA: Diagnosis not present

## 2016-01-21 DIAGNOSIS — Q871 Congenital malformation syndromes predominantly associated with short stature: Secondary | ICD-10-CM | POA: Diagnosis not present

## 2016-01-21 DIAGNOSIS — I10 Essential (primary) hypertension: Secondary | ICD-10-CM | POA: Diagnosis not present

## 2016-01-21 DIAGNOSIS — Z6835 Body mass index (BMI) 35.0-35.9, adult: Secondary | ICD-10-CM | POA: Diagnosis not present

## 2016-01-21 DIAGNOSIS — E890 Postprocedural hypothyroidism: Secondary | ICD-10-CM | POA: Diagnosis not present

## 2016-01-21 DIAGNOSIS — I890 Lymphedema, not elsewhere classified: Secondary | ICD-10-CM | POA: Diagnosis not present

## 2016-02-16 DIAGNOSIS — I890 Lymphedema, not elsewhere classified: Secondary | ICD-10-CM | POA: Diagnosis not present

## 2016-04-26 DIAGNOSIS — E890 Postprocedural hypothyroidism: Secondary | ICD-10-CM | POA: Diagnosis not present

## 2016-04-26 DIAGNOSIS — E785 Hyperlipidemia, unspecified: Secondary | ICD-10-CM | POA: Diagnosis not present

## 2016-04-26 DIAGNOSIS — F411 Generalized anxiety disorder: Secondary | ICD-10-CM | POA: Diagnosis not present

## 2016-07-26 DIAGNOSIS — F411 Generalized anxiety disorder: Secondary | ICD-10-CM | POA: Diagnosis not present

## 2016-07-26 DIAGNOSIS — Z23 Encounter for immunization: Secondary | ICD-10-CM | POA: Diagnosis not present

## 2016-07-26 DIAGNOSIS — I10 Essential (primary) hypertension: Secondary | ICD-10-CM | POA: Diagnosis not present

## 2016-09-01 DIAGNOSIS — Z9181 History of falling: Secondary | ICD-10-CM | POA: Diagnosis not present

## 2016-09-01 DIAGNOSIS — I10 Essential (primary) hypertension: Secondary | ICD-10-CM | POA: Diagnosis not present

## 2016-09-01 DIAGNOSIS — Z1389 Encounter for screening for other disorder: Secondary | ICD-10-CM | POA: Diagnosis not present

## 2016-09-01 DIAGNOSIS — E890 Postprocedural hypothyroidism: Secondary | ICD-10-CM | POA: Diagnosis not present

## 2016-09-01 DIAGNOSIS — F411 Generalized anxiety disorder: Secondary | ICD-10-CM | POA: Diagnosis not present

## 2016-10-03 DIAGNOSIS — F411 Generalized anxiety disorder: Secondary | ICD-10-CM | POA: Diagnosis not present

## 2016-10-03 DIAGNOSIS — Z6833 Body mass index (BMI) 33.0-33.9, adult: Secondary | ICD-10-CM | POA: Diagnosis not present

## 2016-12-02 DIAGNOSIS — I10 Essential (primary) hypertension: Secondary | ICD-10-CM | POA: Diagnosis not present

## 2016-12-02 DIAGNOSIS — E890 Postprocedural hypothyroidism: Secondary | ICD-10-CM | POA: Diagnosis not present

## 2016-12-02 DIAGNOSIS — Z6833 Body mass index (BMI) 33.0-33.9, adult: Secondary | ICD-10-CM | POA: Diagnosis not present

## 2016-12-02 DIAGNOSIS — E785 Hyperlipidemia, unspecified: Secondary | ICD-10-CM | POA: Diagnosis not present

## 2016-12-02 DIAGNOSIS — Q871 Congenital malformation syndromes predominantly associated with short stature: Secondary | ICD-10-CM | POA: Diagnosis not present

## 2016-12-28 DIAGNOSIS — L03115 Cellulitis of right lower limb: Secondary | ICD-10-CM | POA: Diagnosis not present

## 2016-12-28 DIAGNOSIS — R21 Rash and other nonspecific skin eruption: Secondary | ICD-10-CM | POA: Diagnosis not present

## 2017-01-11 DIAGNOSIS — L03115 Cellulitis of right lower limb: Secondary | ICD-10-CM | POA: Diagnosis not present

## 2017-01-11 DIAGNOSIS — R21 Rash and other nonspecific skin eruption: Secondary | ICD-10-CM | POA: Diagnosis not present

## 2017-01-12 DIAGNOSIS — Z1389 Encounter for screening for other disorder: Secondary | ICD-10-CM | POA: Diagnosis not present

## 2017-01-12 DIAGNOSIS — Z1231 Encounter for screening mammogram for malignant neoplasm of breast: Secondary | ICD-10-CM | POA: Diagnosis not present

## 2017-01-12 DIAGNOSIS — Z6834 Body mass index (BMI) 34.0-34.9, adult: Secondary | ICD-10-CM | POA: Diagnosis not present

## 2017-01-12 DIAGNOSIS — Z0000 Encounter for general adult medical examination without abnormal findings: Secondary | ICD-10-CM | POA: Diagnosis not present

## 2017-01-12 DIAGNOSIS — Z9181 History of falling: Secondary | ICD-10-CM | POA: Diagnosis not present

## 2017-01-12 DIAGNOSIS — Z136 Encounter for screening for cardiovascular disorders: Secondary | ICD-10-CM | POA: Diagnosis not present

## 2017-01-12 DIAGNOSIS — E785 Hyperlipidemia, unspecified: Secondary | ICD-10-CM | POA: Diagnosis not present

## 2017-03-01 DIAGNOSIS — H5010 Unspecified exotropia: Secondary | ICD-10-CM | POA: Diagnosis not present

## 2017-03-01 DIAGNOSIS — H2513 Age-related nuclear cataract, bilateral: Secondary | ICD-10-CM | POA: Diagnosis not present

## 2017-03-09 DIAGNOSIS — R21 Rash and other nonspecific skin eruption: Secondary | ICD-10-CM | POA: Diagnosis not present

## 2017-03-09 DIAGNOSIS — I10 Essential (primary) hypertension: Secondary | ICD-10-CM | POA: Diagnosis not present

## 2017-03-09 DIAGNOSIS — F411 Generalized anxiety disorder: Secondary | ICD-10-CM | POA: Diagnosis not present

## 2017-03-09 DIAGNOSIS — Q871 Congenital malformation syndromes predominantly associated with short stature: Secondary | ICD-10-CM | POA: Diagnosis not present

## 2017-03-09 DIAGNOSIS — E890 Postprocedural hypothyroidism: Secondary | ICD-10-CM | POA: Diagnosis not present

## 2017-03-09 DIAGNOSIS — Z6833 Body mass index (BMI) 33.0-33.9, adult: Secondary | ICD-10-CM | POA: Diagnosis not present

## 2017-03-09 DIAGNOSIS — I890 Lymphedema, not elsewhere classified: Secondary | ICD-10-CM | POA: Diagnosis not present

## 2017-03-09 DIAGNOSIS — Z1231 Encounter for screening mammogram for malignant neoplasm of breast: Secondary | ICD-10-CM | POA: Diagnosis not present

## 2017-06-13 DIAGNOSIS — F411 Generalized anxiety disorder: Secondary | ICD-10-CM | POA: Diagnosis not present

## 2017-06-13 DIAGNOSIS — Z23 Encounter for immunization: Secondary | ICD-10-CM | POA: Diagnosis not present

## 2017-06-13 DIAGNOSIS — I10 Essential (primary) hypertension: Secondary | ICD-10-CM | POA: Diagnosis not present

## 2017-06-13 DIAGNOSIS — Z6833 Body mass index (BMI) 33.0-33.9, adult: Secondary | ICD-10-CM | POA: Diagnosis not present

## 2017-07-12 DIAGNOSIS — I10 Essential (primary) hypertension: Secondary | ICD-10-CM | POA: Diagnosis not present

## 2017-07-12 DIAGNOSIS — Q871 Congenital malformation syndromes predominantly associated with short stature: Secondary | ICD-10-CM | POA: Diagnosis not present

## 2017-07-12 DIAGNOSIS — Z6833 Body mass index (BMI) 33.0-33.9, adult: Secondary | ICD-10-CM | POA: Diagnosis not present

## 2017-07-12 DIAGNOSIS — E785 Hyperlipidemia, unspecified: Secondary | ICD-10-CM | POA: Diagnosis not present

## 2017-07-12 DIAGNOSIS — F411 Generalized anxiety disorder: Secondary | ICD-10-CM | POA: Diagnosis not present

## 2017-07-12 DIAGNOSIS — E890 Postprocedural hypothyroidism: Secondary | ICD-10-CM | POA: Diagnosis not present

## 2017-10-12 DIAGNOSIS — E890 Postprocedural hypothyroidism: Secondary | ICD-10-CM | POA: Diagnosis not present

## 2017-10-12 DIAGNOSIS — I890 Lymphedema, not elsewhere classified: Secondary | ICD-10-CM | POA: Diagnosis not present

## 2017-10-12 DIAGNOSIS — Z6833 Body mass index (BMI) 33.0-33.9, adult: Secondary | ICD-10-CM | POA: Diagnosis not present

## 2017-10-12 DIAGNOSIS — I10 Essential (primary) hypertension: Secondary | ICD-10-CM | POA: Diagnosis not present

## 2017-10-12 DIAGNOSIS — Q871 Congenital malformation syndromes predominantly associated with short stature: Secondary | ICD-10-CM | POA: Diagnosis not present

## 2017-10-12 DIAGNOSIS — E785 Hyperlipidemia, unspecified: Secondary | ICD-10-CM | POA: Diagnosis not present

## 2018-01-18 DIAGNOSIS — I10 Essential (primary) hypertension: Secondary | ICD-10-CM | POA: Diagnosis not present

## 2018-01-18 DIAGNOSIS — E890 Postprocedural hypothyroidism: Secondary | ICD-10-CM | POA: Diagnosis not present

## 2018-01-18 DIAGNOSIS — E785 Hyperlipidemia, unspecified: Secondary | ICD-10-CM | POA: Diagnosis not present

## 2018-01-18 DIAGNOSIS — Z1331 Encounter for screening for depression: Secondary | ICD-10-CM | POA: Diagnosis not present

## 2018-01-18 DIAGNOSIS — Z9181 History of falling: Secondary | ICD-10-CM | POA: Diagnosis not present

## 2018-02-23 ENCOUNTER — Inpatient Hospital Stay (HOSPITAL_COMMUNITY)
Admission: EM | Admit: 2018-02-23 | Discharge: 2018-03-06 | DRG: 326 | Disposition: A | Discharge status: Home Health | Payer: Medicare Other | Attending: Family Medicine | Admitting: Family Medicine

## 2018-02-23 ENCOUNTER — Ambulatory Visit (HOSPITAL_COMMUNITY)
Admission: EM | Admit: 2018-02-23 | Discharge: 2018-02-23 | Disposition: A | Discharge status: Home / Self Care | Payer: Medicare Other | Source: Home / Self Care

## 2018-02-23 ENCOUNTER — Emergency Department (HOSPITAL_COMMUNITY): Payer: Medicare Other

## 2018-02-23 ENCOUNTER — Encounter (HOSPITAL_COMMUNITY): Payer: Self-pay | Admitting: Emergency Medicine

## 2018-02-23 DIAGNOSIS — K311 Adult hypertrophic pyloric stenosis: Secondary | ICD-10-CM | POA: Diagnosis not present

## 2018-02-23 DIAGNOSIS — I872 Venous insufficiency (chronic) (peripheral): Secondary | ICD-10-CM | POA: Diagnosis not present

## 2018-02-23 DIAGNOSIS — J309 Allergic rhinitis, unspecified: Secondary | ICD-10-CM | POA: Diagnosis present

## 2018-02-23 DIAGNOSIS — R6250 Unspecified lack of expected normal physiological development in childhood: Secondary | ICD-10-CM | POA: Diagnosis present

## 2018-02-23 DIAGNOSIS — G4733 Obstructive sleep apnea (adult) (pediatric): Secondary | ICD-10-CM | POA: Diagnosis not present

## 2018-02-23 DIAGNOSIS — K440 Diaphragmatic hernia with obstruction, without gangrene: Secondary | ICD-10-CM | POA: Diagnosis not present

## 2018-02-23 DIAGNOSIS — Z79890 Hormone replacement therapy (postmenopausal): Secondary | ICD-10-CM

## 2018-02-23 DIAGNOSIS — S36893A Laceration of other intra-abdominal organs, initial encounter: Secondary | ICD-10-CM | POA: Diagnosis not present

## 2018-02-23 DIAGNOSIS — Z452 Encounter for adjustment and management of vascular access device: Secondary | ICD-10-CM | POA: Diagnosis not present

## 2018-02-23 DIAGNOSIS — K660 Peritoneal adhesions (postprocedural) (postinfection): Secondary | ICD-10-CM | POA: Diagnosis not present

## 2018-02-23 DIAGNOSIS — R1084 Generalized abdominal pain: Secondary | ICD-10-CM | POA: Diagnosis not present

## 2018-02-23 DIAGNOSIS — I10 Essential (primary) hypertension: Secondary | ICD-10-CM | POA: Diagnosis not present

## 2018-02-23 DIAGNOSIS — Z91048 Other nonmedicinal substance allergy status: Secondary | ICD-10-CM

## 2018-02-23 DIAGNOSIS — R935 Abnormal findings on diagnostic imaging of other abdominal regions, including retroperitoneum: Secondary | ICD-10-CM | POA: Diagnosis not present

## 2018-02-23 DIAGNOSIS — Z79899 Other long term (current) drug therapy: Secondary | ICD-10-CM | POA: Diagnosis not present

## 2018-02-23 DIAGNOSIS — K3189 Other diseases of stomach and duodenum: Secondary | ICD-10-CM | POA: Diagnosis not present

## 2018-02-23 DIAGNOSIS — E785 Hyperlipidemia, unspecified: Secondary | ICD-10-CM | POA: Diagnosis present

## 2018-02-23 DIAGNOSIS — Q8711 Prader-Willi syndrome: Secondary | ICD-10-CM

## 2018-02-23 DIAGNOSIS — F79 Unspecified intellectual disabilities: Secondary | ICD-10-CM | POA: Diagnosis present

## 2018-02-23 DIAGNOSIS — Z880 Allergy status to penicillin: Secondary | ICD-10-CM

## 2018-02-23 DIAGNOSIS — E039 Hypothyroidism, unspecified: Secondary | ICD-10-CM | POA: Diagnosis present

## 2018-02-23 DIAGNOSIS — Z885 Allergy status to narcotic agent status: Secondary | ICD-10-CM

## 2018-02-23 DIAGNOSIS — R109 Unspecified abdominal pain: Secondary | ICD-10-CM | POA: Diagnosis not present

## 2018-02-23 DIAGNOSIS — J9811 Atelectasis: Secondary | ICD-10-CM | POA: Diagnosis not present

## 2018-02-23 DIAGNOSIS — Z0189 Encounter for other specified special examinations: Secondary | ICD-10-CM

## 2018-02-23 DIAGNOSIS — F419 Anxiety disorder, unspecified: Secondary | ICD-10-CM | POA: Diagnosis not present

## 2018-02-23 DIAGNOSIS — J9859 Other diseases of mediastinum, not elsewhere classified: Secondary | ICD-10-CM | POA: Diagnosis not present

## 2018-02-23 DIAGNOSIS — K449 Diaphragmatic hernia without obstruction or gangrene: Secondary | ICD-10-CM | POA: Diagnosis not present

## 2018-02-23 DIAGNOSIS — Q871 Congenital malformation syndromes predominantly associated with short stature: Secondary | ICD-10-CM

## 2018-02-23 DIAGNOSIS — Z7951 Long term (current) use of inhaled steroids: Secondary | ICD-10-CM

## 2018-02-23 DIAGNOSIS — E876 Hypokalemia: Secondary | ICD-10-CM | POA: Diagnosis not present

## 2018-02-23 DIAGNOSIS — R29898 Other symptoms and signs involving the musculoskeletal system: Secondary | ICD-10-CM

## 2018-02-23 DIAGNOSIS — Z4682 Encounter for fitting and adjustment of non-vascular catheter: Secondary | ICD-10-CM | POA: Diagnosis not present

## 2018-02-23 LAB — CBC
HCT: 44.5 % (ref 36.0–46.0)
Hemoglobin: 14.2 g/dL (ref 12.0–15.0)
MCH: 30.5 pg (ref 26.0–34.0)
MCHC: 31.9 g/dL (ref 30.0–36.0)
MCV: 95.5 fL (ref 78.0–100.0)
PLATELETS: 374 10*3/uL (ref 150–400)
RBC: 4.66 MIL/uL (ref 3.87–5.11)
RDW: 13 % (ref 11.5–15.5)
WBC: 10.3 10*3/uL (ref 4.0–10.5)

## 2018-02-23 LAB — COMPREHENSIVE METABOLIC PANEL
ALT: 14 U/L (ref 0–44)
AST: 17 U/L (ref 15–41)
Albumin: 4 g/dL (ref 3.5–5.0)
Alkaline Phosphatase: 61 U/L (ref 38–126)
Anion gap: 10 (ref 5–15)
BUN: 9 mg/dL (ref 6–20)
CHLORIDE: 103 mmol/L (ref 98–111)
CO2: 31 mmol/L (ref 22–32)
CREATININE: 0.8 mg/dL (ref 0.44–1.00)
Calcium: 9.8 mg/dL (ref 8.9–10.3)
GFR calc Af Amer: >60 mL/min (ref >60)
GFR calc non Af Amer: >60 mL/min (ref >60)
Glucose, Bld: 157 mg/dL — ABNORMAL HIGH (ref 70–99)
Potassium: 3.5 mmol/L (ref 3.5–5.1)
SODIUM: 144 mmol/L (ref 135–145)
Total Bilirubin: 0.8 mg/dL (ref 0.3–1.2)
Total Protein: 7 g/dL (ref 6.5–8.1)

## 2018-02-23 LAB — I-STAT BETA HCG BLOOD, ED (MC, WL, AP ONLY): I-stat hCG, quantitative: <5 m[IU]/mL (ref <5)

## 2018-02-23 LAB — URINALYSIS, ROUTINE W REFLEX MICROSCOPIC
Bilirubin Urine: NEGATIVE
Glucose, UA: NEGATIVE mg/dL
Hgb urine dipstick: NEGATIVE
KETONES UR: NEGATIVE mg/dL
Leukocytes, UA: NEGATIVE
Nitrite: NEGATIVE
PROTEIN: NEGATIVE mg/dL
Specific Gravity, Urine: >1.046 — ABNORMAL HIGH (ref 1.005–1.030)
pH: 6 (ref 5.0–8.0)

## 2018-02-23 LAB — LIPASE, BLOOD: LIPASE: 26 U/L (ref 11–51)

## 2018-02-23 MED ORDER — FENTANYL CITRATE (PF) 100 MCG/2ML IJ SOLN
50.0000 ug | Freq: Once | INTRAMUSCULAR | Status: AC
  Administered 2018-02-23: 50 ug via INTRAMUSCULAR
  Filled 2018-02-23: qty 2

## 2018-02-23 MED ORDER — SODIUM CHLORIDE 0.9 % IV SOLN
INTRAVENOUS | Status: DC
  Administered 2018-02-24: 100 mL/h via INTRAVENOUS

## 2018-02-23 MED ORDER — FENTANYL CITRATE (PF) 100 MCG/2ML IJ SOLN
50.0000 ug | Freq: Once | INTRAMUSCULAR | Status: AC
  Administered 2018-02-23: 50 ug via INTRAVENOUS
  Filled 2018-02-23: qty 2

## 2018-02-23 MED ORDER — SODIUM CHLORIDE 0.9 % IV SOLN
INTRAVENOUS | Status: DC
  Administered 2018-02-23: 18:00:00 via INTRAVENOUS

## 2018-02-23 MED ORDER — LORAZEPAM 2 MG/ML IJ SOLN
0.5000 mg | Freq: Once | INTRAMUSCULAR | Status: AC
  Administered 2018-02-23: 0.5 mg via INTRAVENOUS
  Filled 2018-02-23: qty 1

## 2018-02-23 MED ORDER — IOHEXOL 300 MG/ML  SOLN
100.0000 mL | Freq: Once | INTRAMUSCULAR | Status: AC | PRN
  Administered 2018-02-23: 100 mL via INTRAVENOUS

## 2018-02-23 NOTE — ED Triage Notes (Signed)
Spoke with Dr. Delton SeeNelson about pt symptoms, pt in severe pain, doubled over, crying out, very tender to palpation. Pt needs eval in ER per DR. Delton SeeNelson, caregiver agreeable to plan.

## 2018-02-23 NOTE — ED Provider Notes (Signed)
Patient placed in Quick Look pathway, seen and evaluated   Chief Complaint: abdominal pain  HPI:   Barbara Ward is a 47 y.o. female who presents to the ED with abdominal pain. Patient went to Urgent Care and the RN spoke with  Dr. Delton SeeNelson about pt symptoms, pt was in  in severe pain, doubled over, crying out, very tender to palpation. Dr. Delton SeeNelson sent patient to the ED for evaluation. Caregivers here with patient.   ROS: GI: abdominal pain  Physical Exam:  BP 128/70 (BP Location: Left Arm)   Pulse 63   Temp 98.6 F (37 C) (Oral)   Resp 20   SpO2 100%    Gen: No distress  Neuro: Awake and Alert  Skin: Warm and dry  Abdomen: tender on exam   Initiation of care has begun. The patient has been counseled on the process, plan, and necessity for staying for the completion/evaluation, and the remainder of the medical screening examination     Janne Napoleoneese, Hope M, NP 02/23/18 1606    Blane OharaZavitz, Joshua, MD 02/24/18 [phone]

## 2018-02-23 NOTE — H&P (Addendum)
Family Medicine Teaching [facility] Admission History and Physical Service Pager: [phone]  Patient name: Barbara Ward Medical record number: [id number] Date of birth: 09-09-1970 Age: 47 y.o. Gender: female  Primary Care Provider: Patient, No Pcp Per Consultants: Surgery Code Status: Full  Chief Complaint: Abdominal pain  Assessment and Plan: Barbara Ward is a 47 y.o. female presenting with severe abdominal pain 2/2 diaphragmatic and anterior mediastinal herniation. PMH is significant for pedal edema 2/2 venous insufficiency and Prader-Willi Syndrome.  Gastric Outlet Obstruction: CT scan showed a markedly distended stomach with transverse colon and omentum herniating into anterior mediastinum, causing gastric outlet obstruction. In the Emergency Department general surgery was consulted and an NG tube was placed, removing 800cc of emesis.  Stomach distension and necrosis has been seen in individuals with Prader-Willi, although CT did not find evidence of necrosis.  Pain was severe on presentation and was first controlled with Fentanyl 50 mcg in the ED, but decompression resulted in significant improvement in her pain.  General surgery assessed patient in the ED and recommended GI consult for possible scope and stomach has decompressed and possible eventual surgery. - Admit to med-surg, attending Dr. Pollie Meyer - Keep patient NPO - NG tube in place - Surgery consulted, appreciate recommendations - Pain control and comfort management with Fentanyl; consider IV morphine for longer-acting pain control - monitor pain closely; patients with P-W have high pain thresholds - Hold all other PO meds until after NG tube removed - GI consult on 7/6  Prader-Willi Syndrome: patient has the maturity and cognition of ~27-30 year old and is very sweet. PWS comes with an insatiable appetite, however she has been restricted to a 1200 calorie diet and has a BMI in the healthy range.   - Keep patient NPO  until after NG tube removed  - Strict diet control at 1,200 calories/day. - Multivitamin and Calcium supplementation after PO restriction lifted  Hypothyroidism: most recent TSH unknown.  - Takes Levothyroxine qDaily PO before breakfast - Hold Levothyroxine until after NG tube removed. - obtain TSH  Hypertension: on admission blood pressure was 104/67 and heart rate was 75. - Lasix 40mg  PO qDaily (likely also for venous stasis) - Ramipril 10mg  capsule 10mg  PO qAM - Hold Lasix and Ramipril until after NG tube removed  HLD: previous lipid levels unknown - Lovastatin 20mg  PO qHS  - Hold Lovastatin until after NG tube removed - obtain lipid profile  Anxiety: well-controlled with medications - Buspirone 5mg  tablet PO BID - Citalopram 40mg  tablet PO qDaily - Hold Buspirone and Citalopram until NG tube removed  Sleep Apnea: patient uses CPAP at night; however, current NG tube complicates mask placement - d/c CPAP until after NG tube removed  FEN/GI: NPO, then clear after surgery, then regular as tolerated Prophylaxis: none  Disposition: Med-surg  Level 5 caveat due to patient's developmental delay. History obtained from family and caregivers at her group home.  History of Present Illness:  Barbara Ward is a 47 y.o. female presenting with stomach pain starting at 4-5pm on 7/4 after a cookout at her group home. Later on 7/4, she did not want dinner and "turned white," saying that she did not feel well. She reported feeling hot and cold.  She did not want to eat breakfast the following morning and was nauseated. She was burping and continued to have worsening pain in her abdomen radiating to her back. She was brought to the ED on 7/5 after calling her mother and her  doctor and a brief visit to urgent care. The physician said to bring her to the ED. She has not vomited, but has urinated and had bowel movements as normal. She denies having had this problem before.  In the ED she was  sent to CT and found to have herniation of the omentum and transverse colon into the anterior mediastinum (ant to the heart), creating a mass effect on the distal stomach and proximal duodenum. There is otherwise no evidence for gastric wall thickening, colonic obstruction, or colonic wall thickening. An NG tube was placed, removing    Review Of Systems: provided by patient's family Review of Systems  Constitutional: Positive for chills. Negative for fever.  Respiratory: Negative for shortness of breath.   Cardiovascular: Negative for chest pain.  Gastrointestinal: Positive for abdominal pain and nausea. Negative for constipation, diarrhea and vomiting.  Genitourinary: Positive for flank pain.  Musculoskeletal: Positive for back pain and myalgias.    Patient Active Problem List   Diagnosis Date Noted  . Prader-Willi syndrome 02/24/2018  . Hypothyroidism 02/24/2018  . Hernia of mediastinum 02/23/2018    Past Medical History: Past Medical History:  Diagnosis Date  . Allergic rhinitis   . Hyperlipidemia   . Hypokalemia   . MR (mental retardation)   . Prader-Willi syndrome   . Sleep apnea   . Thyroid disease     Past Surgical History: Past Surgical History:  Procedure Laterality Date  . HERNIA REPAIR    . THYROIDECTOMY      Social History: Social History   Tobacco Use  . Smoking status: Never Smoker  . Smokeless tobacco: Never Used  Substance Use Topics  . Alcohol use: No  . Drug use: No   Additional social history:   Please also refer to relevant sections of EMR.  Family History: History reviewed. No pertinent family history.  Allergies and Medications: Allergies  Allergen Reactions  . Codeine Other (See Comments)    Unknown reaction  . Penicillins Other (See Comments)    Unknown reaction  . Tape Other (See Comments)    "surgical tape" - unknown reaction   No current facility-administered medications on file prior to encounter.    Current Outpatient  Medications on File Prior to Encounter  Medication Sig Dispense Refill  . busPIRone (BUSPAR) 5 MG tablet Take 5 mg by mouth 2 (two) times daily.    . Calcium Polycarbophil (FIBER-CAPS PO) Take 1 capsule by mouth 2 (two) times daily.    . citalopram (CELEXA) 40 MG tablet Take 40 mg by mouth daily.    Marland Kitchen. docusate sodium (COLACE) 100 MG capsule Take 100 mg by mouth every evening.    . fluticasone (FLONASE) 50 MCG/ACT nasal spray Place 1 spray into both nostrils every evening.     . furosemide (LASIX) 40 MG tablet Take 40-80 mg by mouth 2 (two) times daily. Take 2 tabs (80mg ) in the morning, and 1 tab (40mg ) in the evening.    Marland Kitchen. glucosamine-chondroitin 500-400 MG tablet Take 1 tablet by mouth every morning.    Marland Kitchen. levothyroxine (SYNTHROID, LEVOTHROID) 137 MCG tablet Take 137 mcg by mouth daily before breakfast.    . loratadine (CLARITIN) 10 MG tablet Take 10 mg by mouth every morning.    . lovastatin (MEVACOR) 20 MG tablet Take 20 mg by mouth every evening.    . Multiple Vitamin (MULTIVITAMIN WITH MINERALS) TABS tablet Take 1 tablet by mouth every morning.    . potassium chloride (K-DUR) 10 MEQ tablet  Take 10 mEq by mouth every morning.    . ramipril (ALTACE) 10 MG capsule Take 10 mg by mouth every morning.      Objective: BP 104/67 (BP Location: Left Arm)   Pulse 75   Temp 98 F (36.7 C) (Oral)   Resp 18   SpO2 95%  Physical Exam  Constitutional: She appears well-nourished. No distress.  HENT:  Head: Atraumatic.  Mouth/Throat: Oropharynx is clear and moist.  Eyes: EOM are normal. No scleral icterus.  Neck: Normal range of motion.  Cardiovascular: Normal rate, regular rhythm, normal heart sounds and intact distal pulses. Exam reveals no gallop and no friction rub.  No murmur heard. Pulmonary/Chest: Effort normal and breath sounds normal.  Abdominal: Normal appearance and bowel sounds are normal. There is tenderness (generalized, to palpation) in the right upper quadrant and left upper  quadrant.  Musculoskeletal: Normal range of motion. She exhibits edema (bilateral LEs, extra skin from previous weigh tloss). She exhibits no tenderness or deformity.  Neurological: She is alert.  Skin: Skin is warm and dry. Capillary refill takes less than 2 seconds.  Psychiatric:  Mental retardation 2/2 PWS    Labs and Imaging: CBC BMET  Recent Labs  Lab 02/23/18 1609  WBC 10.3  HGB 14.2  HCT 44.5  PLT 374   Recent Labs  Lab 02/23/18 1609  NA 144  K 3.5  CL 103  CO2 31  BUN 9  CREATININE 0.80  GLUCOSE 157*  CALCIUM 9.8     [name], DO [facility], PGY-1 02/24/2018 2:39 AM  FPTS Upper-Level Resident Addendum   I have independently interviewed and examined the patient. I have discussed the above with the original author and agree with their documentation. My edits for correction/addition/clarification are in blue. Please see also any attending notes.    Lennox Solders, MD PGY-2, [facility] 02/24/2018 2:39 AM  FPTS Service pager: [phone] (text pages welcome through [facility])

## 2018-02-23 NOTE — Consult Note (Signed)
Reason for Consult:gastric outlet obsturuction Referring Physician: Dr. [name] is an 47 y.o. female.  HPI: This is a 47 year old female with Prader-Willi syndrome and MR who presented with a 1 day history of upper abdominal pain.  It started gradually and then became acutely worse with nausea and vomiting.  She has no previous history of similar pain.  She is on a limited calorie diet.  She does not take NSAIDs.  She described the pain as sharp and severe hurting through to the back.  She has since had a nasogastric tube placed and now has minimal abdominal pain.  General surgery has been asked to see because of the gastric outlet obstruction.  She was found on a CT scan to have a markedly dilated stomach.  There was transverse colon and omentum herniating through the diaphragm into the anterior mediastinum suggesting that this was coming across the distal stomach/duodenum creating the obstruction.  Past Medical History:  Diagnosis Date  . Allergic rhinitis   . Hyperlipidemia   . Hypokalemia   . MR (mental retardation)   . Prader-Willi syndrome   . Sleep apnea   . Thyroid disease     Past Surgical History:  Procedure Laterality Date  . HERNIA REPAIR    . THYROIDECTOMY      No family history on file.  Social History:  reports that she has never smoked. She does not have any smokeless tobacco history on file. She reports that she does not drink alcohol or use drugs.  Allergies:  Allergies  Allergen Reactions  . Codeine Other (See Comments)    Unknown reaction  . Penicillins Other (See Comments)    Unknown reaction  . Tape Other (See Comments)    "surgical tape" - unknown reaction    Medications: I have reviewed the patient's current medications.  Results for orders placed or performed during the hospital encounter of 02/23/18 (from the past 48 hour(s))  Lipase, blood     Status: None   Collection Time: 02/23/18  4:09 PM  Result Value Ref Range   Lipase  26 11 - 51 U/L    Comment: Performed at [facility], [place] [address], [place], [place] [address]  Comprehensive metabolic panel     Status: Abnormal   Collection Time: 02/23/18  4:09 PM  Result Value Ref Range   Sodium 144 135 - 145 mmol/L   Potassium 3.5 3.5 - 5.1 mmol/L   Chloride 103 98 - 111 mmol/L    Comment: Please note change in reference range.   CO2 31 22 - 32 mmol/L   Glucose, Bld 157 (H) 70 - 99 mg/dL    Comment: Please note change in reference range.   BUN 9 6 - 20 mg/dL    Comment: Please note change in reference range.   Creatinine, Ser 0.80 0.44 - 1.00 mg/dL   Calcium 9.8 8.9 - 10.3 mg/dL   Total Protein 7.0 6.5 - 8.1 g/dL   Albumin 4.0 3.5 - 5.0 g/dL   AST 17 15 - 41 U/L   ALT 14 0 - 44 U/L    Comment: Please note change in reference range.   Alkaline Phosphatase 61 38 - 126 U/L   Total Bilirubin 0.8 0.3 - 1.2 mg/dL   GFR calc non Af Amer >60 >60 mL/min   GFR calc Af Amer >60 >60 mL/min    Comment: (NOTE) The eGFR has been calculated using the CKD EPI equation. This  calculation has not been validated in all clinical situations. eGFR's persistently <60 mL/min signify possible Chronic Kidney Disease.    Anion gap 10 5 - 15    Comment: Performed at [facility] [address] [place] [address]  CBC     Status: None   Collection Time: 02/23/18  4:09 PM  Result Value Ref Range   WBC 10.3 4.0 - 10.5 K/uL   RBC 4.66 3.87 - 5.11 MIL/uL   Hemoglobin 14.2 12.0 - 15.0 g/dL   HCT 44.5 36.0 - 46.0 %   MCV 95.5 78.0 - 100.0 fL   MCH 30.5 26.0 - 34.0 pg   MCHC 31.9 30.0 - 36.0 g/dL   RDW 13.0 11.5 - 15.5 %   Platelets 374 150 - 400 K/uL    Comment: Performed at [facility] [address], [place], [place] [address]  I-Stat beta hCG blood, ED     Status: None   Collection Time: 02/23/18  4:14 PM  Result Value Ref Range   I-stat hCG, quantitative <5.0 <5 mIU/mL   Comment 3            Comment:   GEST. AGE      CONC.  (mIU/mL)   <=1 WEEK         5 - 50     2 WEEKS       50 - 500     3 WEEKS       100 - 10,000     4 WEEKS     1,000 - 30,000        FEMALE AND NON-PREGNANT FEMALE:     LESS THAN 5 mIU/mL   Urinalysis, Routine w reflex microscopic     Status: Abnormal   Collection Time: 02/23/18  8:26 PM  Result Value Ref Range   Color, Urine YELLOW YELLOW   APPearance CLEAR CLEAR   Specific Gravity, Urine >1.046 (H) 1.005 - 1.030   pH 6.0 5.0 - 8.0   Glucose, UA NEGATIVE NEGATIVE mg/dL   Hgb urine dipstick NEGATIVE NEGATIVE   Bilirubin Urine NEGATIVE NEGATIVE   Ketones, ur NEGATIVE NEGATIVE mg/dL   Protein, ur NEGATIVE NEGATIVE mg/dL   Nitrite NEGATIVE NEGATIVE   Leukocytes, UA NEGATIVE NEGATIVE    Comment: Performed at [facility], [place] [address] [place], [place] [address]    Ct Abdomen Pelvis W Contrast  Result Date: 02/23/2018 CLINICAL DATA:  Mid to lower abdominal pain. EXAM: CT ABDOMEN AND PELVIS WITH CONTRAST TECHNIQUE: Multidetector CT imaging of the abdomen and pelvis was performed using the standard protocol following bolus administration of intravenous contrast. CONTRAST:  159m OMNIPAQUE IOHEXOL 300 MG/ML  SOLN COMPARISON:  None. FINDINGS: Lower chest: Herniated colon is identified anterior to the liver and heart. Hepatobiliary: No focal abnormality within the liver parenchyma. There is no evidence for gallstones, gallbladder wall thickening, or pericholecystic fluid. No intrahepatic or extrahepatic biliary dilation. Pancreas: No focal mass lesion. No dilatation of the main duct. No intraparenchymal cyst. No peripancreatic edema. Spleen: No splenomegaly. No focal mass lesion. Adrenals/Urinary Tract: No adrenal nodule or mass. Right kidney unremarkable. 6 mm low-density lesion upper pole left kidney is too small to characterize but likely a cyst. No evidence for hydroureter. The urinary bladder appears normal for the degree of distention. Stomach/Bowel: Stomach is massively distended. There is substantial  mass-effect on the pyloric region of the stomach (axial image 21 series 3 and coronal image 48 of series  6. Duodenum is moderately distended. Jejunal loops and ileal loops are nondilated. The terminal ileum is normal. The appendix is not visualized, but there is no edema or inflammation in the region of the cecum. Ascending colon unremarkable. Transverse colon extends up into a hernia involving the anterior mediastinum with nondilated segments of transverse colon identified anterior to the heart. Transverse colon than exits down through the hernia defect along the duodenum and then courses posterior to the stomach. Colon can be identified between the stomach and the portal splenic confluence on image 29 of series 3. Distal colon is decompressed and unremarkable. Vascular/Lymphatic: No abdominal aortic aneurysm. No abdominal aortic atherosclerotic calcification. Portal vein and superior mesenteric vein are patent. There is no gastrohepatic or hepatoduodenal ligament lymphadenopathy. No intraperitoneal or retroperitoneal lymphadenopathy. No pelvic sidewall lymphadenopathy. Reproductive: The uterus has normal CT imaging appearance. There is no adnexal mass. Other: No intraperitoneal free fluid. Musculoskeletal: No worrisome lytic or sclerotic osseous abnormality. sclerotic focus in the posterior sacrum is likely a bone island. IMPRESSION: Stomach is markedly distended and fluid-filled with evidence of mass-effect on the antral region. This finding is related to herniation of omentum and transverse colon up into the anterior mediastinum, anterior to the heart. The herniated fat and colon generates substantial mass-effect on the distal stomach and proximal duodenum which occupy a high position in the anterior abdomen, in this mass-effect appears to result in a gastric outlet obstruction. There is no evidence of gastric wall thickening, colonic obstruction, or colonic wall thickening. A tiny amount of free fluid is  identified in the anterior mediastinal hernia. I discussed these results by telephone at the time of interpretation on 02/23/2018 at 7:57 pm with [place], Barbara Ward . Electronically Signed   By: Misty Stanley M.D.   On: 02/23/2018 19:59   Dg Chest Portable 1 View  Result Date: 02/23/2018 CLINICAL DATA:  NG tube placement EXAM: PORTABLE CHEST 1 VIEW COMPARISON:  None. FINDINGS: Enteric tube is been placed. The tip is off the field of view but well below the left hemidiaphragm. Shallow inspiration with linear atelectasis in the lung bases. Heart size and pulmonary vascularity are normal. No airspace disease or consolidation in the lungs. No blunting of costophrenic angles. No pneumothorax. Mediastinal contours appear intact. S-shaped thoracolumbar scoliosis. IMPRESSION: Enteric tube tip is off the field of view but below the left hemidiaphragm. Shallow inspiration with linear atelectasis in the lung bases. Thoracolumbar scoliosis. Electronically Signed   By: Lucienne Capers M.D.   On: 02/23/2018 21:19    Review of Systems  Unable to perform ROS: Mental acuity  Gastrointestinal: Positive for abdominal pain and vomiting.   Blood pressure 103/62, pulse 82, temperature 98.6 F (37 C), temperature source Oral, resp. rate 14, SpO2 94 %. Physical Exam  Constitutional: She appears well-developed and well-nourished. No distress.  She is now comfortable in appearance with a nasogastric tube in place  HENT:  Head: Normocephalic and atraumatic.  Right Ear: External ear normal.  Left Ear: External ear normal.  Nose: Nose normal.  Mouth/Throat: No oropharyngeal exudate.  Eyes: Pupils are equal, round, and reactive to light. Right eye exhibits no discharge. Left eye exhibits no discharge. No scleral icterus.  Neck: Normal range of motion. No tracheal deviation present.  Cardiovascular: Normal rate, regular rhythm, normal heart sounds and intact distal pulses.  Respiratory: Effort normal and breath sounds normal.  No respiratory distress.  GI: Soft. She exhibits no distension. There is tenderness. There is no guarding.  Her abdomen  is only mildly full.  She has a well-healed incision at the umbilicus.  There are no hernias.  There is only mild tenderness with no guarding  Musculoskeletal: Normal range of motion. She exhibits no deformity.  Lymphadenopathy:    She has no cervical adenopathy.  Neurological: She is alert.  She answers questions and follows commands  Skin: Skin is warm. No erythema.  Psychiatric: Her behavior is normal.    Assessment/Plan: Gastric outlet obstruction  Again, she has no previous history of similar symptoms.  Currently she is symptomatically improved with gastric decompression by the nasogastric tube.  She is being admitted to the medical service.  She may need a GI consultation to consider upper endoscopy in a few days once the stomach is decompressed to see if this truly looks like external compression versus an internal lesion or ulcer.  Surgical intervention may be necessary eventually to correct the situation.  We will follow her closely with you.  Barbara Ward A 02/23/2018, 11:09 PM

## 2018-02-23 NOTE — ED Notes (Signed)
Admitting Provider at bedside. 

## 2018-02-23 NOTE — ED Triage Notes (Signed)
[facility]/o lower to mid abdominal pain since last night - patient went to urgent care from assisted living facility/day center. Abdomen appears tender to touch. Patient denies N/V/D or fevers/chills.

## 2018-02-23 NOTE — ED Notes (Signed)
Pt going to ct scan at this time  

## 2018-02-23 NOTE — ED Provider Notes (Signed)
[facility] EMERGENCY DEPARTMENT Provider Note   CSN: [id number] Arrival date & time: 02/23/18  1537     History   Chief Complaint Chief Complaint  Patient presents with  . Abdominal Pain    HPI Barbara Ward is a 47 y.o. female.  The history is provided by the patient. No language interpreter was used.  Abdominal Pain   This is a new problem. The current episode started yesterday. The problem occurs constantly. The problem has been gradually worsening. The pain is associated with an unknown factor. The pain is located in the generalized abdominal region. The pain is severe. Pertinent negatives include anorexia and fever. Nothing aggravates the symptoms. Nothing relieves the symptoms.  Family reports pt began having severe abdominal pain today.  Pt reports pain in her abdomen goes through to her back   Past Medical History:  Diagnosis Date  . Allergic rhinitis   . Hyperlipidemia   . Hypokalemia   . MR (mental retardation)   . Prader-Willi syndrome   . Sleep apnea   . Thyroid disease     There are no active problems to display for this patient.   Past Surgical History:  Procedure Laterality Date  . HERNIA REPAIR    . THYROIDECTOMY       OB History   None      Home Medications    Prior to Admission medications   Medication Sig Start Date End Date Taking? Authorizing Provider  acetaminophen (TYLENOL) 500 MG tablet Take 1,000 mg by mouth every 6 (six) hours as needed for mild pain or moderate pain.    [provider]  ALPRAZolam Prudy Feeler) 0.25 MG tablet Take 0.25 mg by mouth 2 (two) times daily as needed for anxiety.    [provider]  betamethasone dipropionate (DIPROLENE) 0.05 % cream Apply 1 application topically 2 (two) times daily as needed (itching).    [provider]  docusate sodium (COLACE) 100 MG capsule Take 100 mg by mouth every evening.    [provider]  fluticasone (FLONASE) 50 MCG/ACT nasal  spray Place 2 sprays into both nostrils every morning.    [provider]  furosemide (LASIX) 40 MG tablet Take 40-80 mg by mouth 2 (two) times daily. Take 2 tabs (80mg ) in the morning, and 1 tab (40mg ) in the evening.    [provider]  glucosamine-chondroitin 500-400 MG tablet Take 1 tablet by mouth every morning.    [provider]  levothyroxine (SYNTHROID, LEVOTHROID) 137 MCG tablet Take 137 mcg by mouth daily before breakfast.    [provider]  loratadine (CLARITIN) 10 MG tablet Take 10 mg by mouth every morning.    [provider]  lovastatin (MEVACOR) 20 MG tablet Take 20 mg by mouth every evening.    [provider]  menthol-zinc oxide (GOLD BOND) powder Apply 1 application topically 2 (two) times daily as needed (itching).    [provider]  Multiple Vitamin (MULTIVITAMIN WITH MINERALS) TABS tablet Take 1 tablet by mouth every morning.    [provider]  Olopatadine HCl (PATADAY) 0.2 % SOLN Place 1 drop into both eyes daily as needed (dry, itchy eyes).    [provider]  potassium chloride (K-DUR) 10 MEQ tablet Take 10 mEq by mouth every morning.    [provider]  ramipril (ALTACE) 10 MG capsule Take 10 mg by mouth every morning.    [provider]    Family History No  family history on file.  Social History Social History   Tobacco Use  . Smoking status: Never Smoker  Substance Use Topics  . Alcohol use: No  . Drug use: No     Allergies   Codeine; Penicillins; and Tape   Review of Systems Review of Systems  Constitutional: Negative for fever.  Gastrointestinal: Positive for abdominal pain. Negative for anorexia.  All other systems reviewed and are negative.    Physical Exam Updated Vital Signs BP 125/70   Pulse 86   Temp 98.6 F (37 C) (Oral)   Resp 18   SpO2 93%   Physical Exam  Constitutional: She appears well-developed and well-nourished.  HENT:    Head: Normocephalic.  Mouth/Throat: Oropharynx is clear and moist.  Eyes: Pupils are equal, round, and reactive to light. EOM are normal.  Cardiovascular: Normal rate and normal heart sounds.  Pulmonary/Chest: Effort normal.  Abdominal: Soft. Normal appearance and bowel sounds are normal. She exhibits distension.  Neurological: She is alert.  Skin: Skin is warm.  Nursing note and vitals reviewed.    ED Treatments / Results  Labs (all labs ordered are listed, but only abnormal results are displayed) Labs Reviewed  COMPREHENSIVE METABOLIC PANEL - Abnormal; Notable for the following components:      Result Value   Glucose, Bld 157 (*)    All other components within normal limits  URINALYSIS, ROUTINE W REFLEX MICROSCOPIC - Abnormal; Notable for the following components:   Specific Gravity, Urine >1.046 (*)    All other components within normal limits  LIPASE, BLOOD  CBC  I-STAT BETA HCG BLOOD, ED ([facility], [facility], [facility] ONLY)    EKG None  Radiology Ct Abdomen Pelvis W Contrast  Result Date: 02/23/2018 CLINICAL DATA:  Mid to lower abdominal pain. EXAM: CT ABDOMEN AND PELVIS WITH CONTRAST TECHNIQUE: Multidetector CT imaging of the abdomen and pelvis was performed using the standard protocol following bolus administration of intravenous contrast. CONTRAST:  OMNIPAQUE IOHEXOL 300 MG/ML  SOLN COMPARISON:  None. FINDINGS: Lower chest: Herniated colon is identified anterior to the liver and heart. Hepatobiliary: No focal abnormality within the liver parenchyma. There is no evidence for gallstones, gallbladder wall thickening, or pericholecystic fluid. No intrahepatic or extrahepatic biliary dilation. Pancreas: No focal mass lesion. No dilatation of the main duct. No intraparenchymal cyst. No peripancreatic edema. Spleen: No splenomegaly. No focal mass lesion. Adrenals/Urinary Tract: No adrenal nodule or mass. Right kidney unremarkable. 6 mm low-density lesion upper pole left kidney is too small to  characterize but likely a cyst. No evidence for hydroureter. The urinary bladder appears normal for the degree of distention. Stomach/Bowel: Stomach is massively distended. There is substantial mass-effect on the pyloric region of the stomach (axial image 21 series 3 and coronal image 48 of series 6. Duodenum is moderately distended. Jejunal loops and ileal loops are nondilated. The terminal ileum is normal. The appendix is not visualized, but there is no edema or inflammation in the region of the cecum. Ascending colon unremarkable. Transverse colon extends up into a hernia involving the anterior mediastinum with nondilated segments of transverse colon identified anterior to the heart. Transverse colon than exits down through the hernia defect along the duodenum and then courses posterior to the stomach. Colon can be identified between the stomach and the portal splenic confluence on image 29 of series 3. Distal colon is decompressed and unremarkable. Vascular/Lymphatic: No abdominal aortic aneurysm. No abdominal aortic atherosclerotic calcification. Portal vein and superior mesenteric vein are patent. There  is no gastrohepatic or hepatoduodenal ligament lymphadenopathy. No intraperitoneal or retroperitoneal lymphadenopathy. No pelvic sidewall lymphadenopathy. Reproductive: The uterus has normal CT imaging appearance. There is no adnexal mass. Other: No intraperitoneal free fluid. Musculoskeletal: No worrisome lytic or sclerotic osseous abnormality. sclerotic focus in the posterior sacrum is likely a bone island. IMPRESSION: Stomach is markedly distended and fluid-filled with evidence of mass-effect on the antral region. This finding is related to herniation of omentum and transverse colon up into the anterior mediastinum, anterior to the heart. The herniated fat and colon generates substantial mass-effect on the distal stomach and proximal duodenum which occupy a high position in the anterior abdomen, in this  mass-effect appears to result in a gastric outlet obstruction. There is no evidence of gastric wall thickening, colonic obstruction, or colonic wall thickening. A tiny amount of free fluid is identified in the anterior mediastinal hernia. I discussed these results by telephone at the time of interpretation on 02/23/2018 at 7:57 pm with Kerigan Narvaez, PA . Electronically Signed   By: Kennith CenterEric  Mansell M.D.   On: 02/23/2018 19:59   Dg Chest Portable 1 View  Result Date: 02/23/2018 CLINICAL DATA:  NG tube placement EXAM: PORTABLE CHEST 1 VIEW COMPARISON:  None. FINDINGS: Enteric tube is been placed. The tip is off the field of view but well below the left hemidiaphragm. Shallow inspiration with linear atelectasis in the lung bases. Heart size and pulmonary vascularity are normal. No airspace disease or consolidation in the lungs. No blunting of costophrenic angles. No pneumothorax. Mediastinal contours appear intact. S-shaped thoracolumbar scoliosis. IMPRESSION: Enteric tube tip is off the field of view but below the left hemidiaphragm. Shallow inspiration with linear atelectasis in the lung bases. Thoracolumbar scoliosis. Electronically Signed   By: Burman NievesWilliam  Stevens M.D.   On: 02/23/2018 21:19    Procedures Procedures (including critical care time)  Medications Ordered in ED Medications  0.9 %  sodium chloride infusion ( Intravenous New Bag/Given 02/23/18 1754)  fentaNYL (SUBLIMAZE) injection 50 mcg (50 mcg Intramuscular Given 02/23/18 1726)  fentaNYL (SUBLIMAZE) injection 50 mcg (50 mcg Intravenous Given 02/23/18 1832)  iohexol (OMNIPAQUE) 300 MG/ML solution 100 mL (100 mLs Intravenous Contrast Given 02/23/18 1901)  fentaNYL (SUBLIMAZE) injection 50 mcg (50 mcg Intravenous Given 02/23/18 2004)  LORazepam (ATIVAN) injection 0.5 mg (0.5 mg Intravenous Given 02/23/18 2033)     Initial Impression / Assessment and Plan / ED Course  I have reviewed the triage vital signs and the nursing notes.  Pertinent labs & imaging  results that were available during my care of the patient were reviewed by me and considered in my medical decision making (see chart for details).    I spoke to the Radiologist who advised pt has a gastric outlet obstruction due to herniated omentum with transverse colon and stomach in the anterior mediastinum.   NG placed to decompress stomach.  I spoke with Dr. Magnus IvanBlackman general Surgeon.  He advised admit to Medicine.  I spoke to family practice resident on call for unassigned who will pt for admission.   Final Clinical Impressions(s) / ED Diagnoses   Final diagnoses:  Gastric outlet obstruction  Generalized abdominal pain  Omental tear, initial encounter    ED Discharge Orders    None       Osie CheeksSofia, Jarae Panas K, PA-C 02/23/18 2223    Lorre NickAllen, Anthony, MD 02/25/18 1550

## 2018-02-24 ENCOUNTER — Other Ambulatory Visit: Payer: Self-pay

## 2018-02-24 ENCOUNTER — Inpatient Hospital Stay (HOSPITAL_COMMUNITY): Payer: Medicare Other

## 2018-02-24 DIAGNOSIS — E039 Hypothyroidism, unspecified: Secondary | ICD-10-CM

## 2018-02-24 DIAGNOSIS — Q8711 Prader-Willi syndrome: Secondary | ICD-10-CM

## 2018-02-24 DIAGNOSIS — K311 Adult hypertrophic pyloric stenosis: Secondary | ICD-10-CM

## 2018-02-24 LAB — COMPREHENSIVE METABOLIC PANEL
ALBUMIN: 3.3 g/dL — AB (ref 3.5–5.0)
ALK PHOS: 47 U/L (ref 38–126)
ALT: 11 U/L (ref 0–44)
ANION GAP: 13 (ref 5–15)
AST: 15 U/L (ref 15–41)
BILIRUBIN TOTAL: 0.8 mg/dL (ref 0.3–1.2)
BUN: 8 mg/dL (ref 6–20)
CALCIUM: 9.3 mg/dL (ref 8.9–10.3)
CO2: 31 mmol/L (ref 22–32)
Chloride: 103 mmol/L (ref 98–111)
Creatinine, Ser: 0.75 mg/dL (ref 0.44–1.00)
GFR calc Af Amer: >60 mL/min (ref >60)
GFR calc non Af Amer: >60 mL/min (ref >60)
GLUCOSE: 95 mg/dL (ref 70–99)
Potassium: 3 mmol/L — ABNORMAL LOW (ref 3.5–5.1)
SODIUM: 147 mmol/L — AB (ref 135–145)
TOTAL PROTEIN: 5.9 g/dL — AB (ref 6.5–8.1)

## 2018-02-24 LAB — CBC
HCT: 38.2 % (ref 36.0–46.0)
HEMOGLOBIN: 12.3 g/dL (ref 12.0–15.0)
MCH: 30.5 pg (ref 26.0–34.0)
MCHC: 32.2 g/dL (ref 30.0–36.0)
MCV: 94.8 fL (ref 78.0–100.0)
Platelets: 308 10*3/uL (ref 150–400)
RBC: 4.03 MIL/uL (ref 3.87–5.11)
RDW: 13.2 % (ref 11.5–15.5)
WBC: 9.7 10*3/uL (ref 4.0–10.5)

## 2018-02-24 LAB — HIV ANTIBODY (ROUTINE TESTING W REFLEX): HIV SCREEN 4TH GENERATION: NONREACTIVE

## 2018-02-24 MED ORDER — POTASSIUM CHLORIDE 10 MEQ/100ML IV SOLN
10.0000 meq | INTRAVENOUS | Status: AC
  Administered 2018-02-24 (×2): 10 meq via INTRAVENOUS
  Filled 2018-02-24 (×3): qty 100

## 2018-02-24 MED ORDER — POTASSIUM CHLORIDE 10 MEQ/100ML IV SOLN
10.0000 meq | INTRAVENOUS | Status: AC
  Administered 2018-02-24 (×2): 10 meq via INTRAVENOUS
  Filled 2018-02-24 (×2): qty 100

## 2018-02-24 MED ORDER — POTASSIUM CHLORIDE 2 MEQ/ML IV SOLN
INTRAVENOUS | Status: DC
  Administered 2018-02-24 – 2018-02-26 (×5): via INTRAVENOUS
  Filled 2018-02-24 (×8): qty 1000

## 2018-02-24 MED ORDER — LORAZEPAM 2 MG/ML IJ SOLN: 0.5000 mg | Freq: Once | INTRAMUSCULAR | Status: DC

## 2018-02-24 MED ORDER — FENTANYL CITRATE (PF) 100 MCG/2ML IJ SOLN
50.0000 ug | INTRAMUSCULAR | Status: DC | PRN
  Administered 2018-02-24 (×2): 50 ug via INTRAVENOUS
  Filled 2018-02-24 (×2): qty 2

## 2018-02-24 MED ORDER — POTASSIUM CHLORIDE 10 MEQ/100ML IV SOLN
10.0000 meq | INTRAVENOUS | Status: AC
  Administered 2018-02-24 (×4): 10 meq via INTRAVENOUS
  Filled 2018-02-24 (×3): qty 100

## 2018-02-24 MED ORDER — FENTANYL CITRATE (PF) 100 MCG/2ML IJ SOLN
50.0000 ug | INTRAMUSCULAR | Status: DC | PRN
  Administered 2018-02-24: 100 ug via INTRAVENOUS
  Administered 2018-02-24: 50 ug via INTRAVENOUS
  Administered 2018-02-24 – 2018-02-25 (×11): 100 ug via INTRAVENOUS
  Filled 2018-02-24 (×12): qty 2

## 2018-02-24 MED ORDER — KETOROLAC TROMETHAMINE 30 MG/ML IJ SOLN
30.0000 mg | Freq: Four times a day (QID) | INTRAMUSCULAR | Status: DC | PRN
  Administered 2018-02-24: 30 mg via INTRAVENOUS
  Filled 2018-02-24: qty 1

## 2018-02-24 MED ORDER — KCL-LACTATED RINGERS 20 MEQ/L IV SOLN
INTRAVENOUS | Status: DC
  Filled 2018-02-24: qty 1000

## 2018-02-24 MED ORDER — FENTANYL CITRATE (PF) 100 MCG/2ML IJ SOLN
50.0000 ug | Freq: Once | INTRAMUSCULAR | Status: AC
  Administered 2018-02-24: 50 ug via INTRAVENOUS
  Filled 2018-02-24: qty 2

## 2018-02-24 MED ORDER — NALOXONE HCL 0.4 MG/ML IJ SOLN: 0.4000 mg | INTRAMUSCULAR | Status: DC | PRN

## 2018-02-24 MED ORDER — FENTANYL CITRATE (PF) 100 MCG/2ML IJ SOLN
50.0000 ug | INTRAMUSCULAR | Status: DC | PRN
  Administered 2018-02-24 (×2): 50 ug via INTRAVENOUS
  Filled 2018-02-24 (×3): qty 2

## 2018-02-24 MED ORDER — LEVOTHYROXINE SODIUM 100 MCG IV SOLR
75.0000 ug | Freq: Every day | INTRAVENOUS | Status: AC
  Administered 2018-02-24 – 2018-03-03 (×8): 75 ug via INTRAVENOUS
  Filled 2018-02-24 (×8): qty 5

## 2018-02-24 NOTE — Progress Notes (Signed)
Md on call notiefied that patient is having " bad pain ". Barbara SkillVeronica Preet Perrier LPN

## 2018-02-24 NOTE — H&P (View-Only) (Signed)
Referring Provider: [facility] Primary Care Physician:  Patient, No Pcp Per Primary Gastroenterologist:  unassigned  Reason for Consultation:  Gastric outlet obstruction  HPI: Barbara Ward is a 47 y.o. female with past medical history of Prader-Willi syndrome, cognitive impairment presented to the hospital with abdominal pain. CT scan upon initial evaluation showed gastric outlet obstruction probably from herniation of omentum and transverse colon up into anterior mediastinum. GI is consulted for possible endoscopy to rule out any intragastric / duodenal lesions.   Patient seen and examined at bedside. Multiple family members at bedside. She was relatively at her baseline until yesterday and she started experiencing severe abdominal pain. Currently has NG tube in place. Minimal output. She continues to have abdominal pain . No black tarry stool or bright blood per rectum.  Past Medical History:  Diagnosis Date  . Allergic rhinitis   . Hyperlipidemia   . Hypokalemia   . MR (mental retardation)   . Prader-Willi syndrome   . Sleep apnea   . Thyroid disease     Past Surgical History:  Procedure Laterality Date  . HERNIA REPAIR    . THYROIDECTOMY      Prior to Admission medications   Medication Sig Start Date End Date Taking? Authorizing Provider  busPIRone (BUSPAR) 5 MG tablet Take 5 mg by mouth 2 (two) times daily.   Yes [provider]  Calcium Polycarbophil (FIBER-CAPS PO) Take 1 capsule by mouth 2 (two) times daily.   Yes [provider]  citalopram (CELEXA) 40 MG tablet Take 40 mg by mouth daily.   Yes [provider]  docusate sodium (COLACE) 100 MG capsule Take 100 mg by mouth every evening.   Yes [provider]  fluticasone (FLONASE) 50 MCG/ACT nasal spray Place 1 spray into both nostrils every evening.    Yes [provider]  furosemide (LASIX) 40 MG tablet Take 40-80 mg by mouth 2 (two) times daily. Take 2 tabs (80mg) in the morning, and  1 tab (40mg) in the evening.   Yes [provider]  glucosamine-chondroitin 500-400 MG tablet Take 1 tablet by mouth every morning.   Yes [provider]  levothyroxine (SYNTHROID, LEVOTHROID) 137 MCG tablet Take 137 mcg by mouth daily before breakfast.   Yes [provider]  loratadine (CLARITIN) 10 MG tablet Take 10 mg by mouth every morning.   Yes [provider]  lovastatin (MEVACOR) 20 MG tablet Take 20 mg by mouth every evening.   Yes [provider]  Multiple Vitamin (MULTIVITAMIN WITH MINERALS) TABS tablet Take 1 tablet by mouth every morning.   Yes [provider]  potassium chloride (K-DUR) 10 MEQ tablet Take 10 mEq by mouth every morning.   Yes [provider]  ramipril (ALTACE) 10 MG capsule Take 10 mg by mouth every morning.   Yes [provider]    Scheduled Meds: Continuous Infusions: . lactated ringers with kcl 100 mL/hr at 02/24/18 0426  . potassium chloride 10 mEq (02/24/18 1131)   PRN Meds:.fentaNYL (SUBLIMAZE) injection  Allergies as of 02/23/2018 - Review Complete 02/23/2018  Allergen Reaction Noted  . Codeine Other (See Comments) 02/18/2014  . Penicillins Other (See Comments) 02/18/2014  . Tape Other (See Comments) 02/18/2014    History reviewed. No pertinent family history.  Social History   Socioeconomic History  . Marital status: Single    Spouse name: Not on file  . Number of children: Not on file  . Years of education: Not on file  .   Highest education level: Not on file  Occupational History  . Not on file  Social Needs  . Financial resource strain: Not on file  . Food insecurity:    Worry: Not on file    Inability: Not on file  . Transportation needs:    Medical: Not on file    Non-medical: Not on file  Tobacco Use  . Smoking status: Never Smoker  . Smokeless tobacco: Never Used  Substance and Sexual Activity  . Alcohol use: No  . Drug use: No  . Sexual activity: Not  on file  Lifestyle  . Physical activity:    Days per week: Not on file    Minutes per session: Not on file  . Stress: Not on file  Relationships  . Social connections:    Talks on phone: Not on file    Gets together: Not on file    Attends religious service: Not on file    Active member of club or organization: Not on file    Attends meetings of clubs or organizations: Not on file    Relationship status: Not on file  . Intimate partner violence:    Fear of current or ex partner: Not on file    Emotionally abused: Not on file    Physically abused: Not on file    Forced sexual activity: Not on file  Other Topics Concern  . Not on file  Social History Narrative  . Not on file    Review of Systems: not able to obtain.  Physical Exam: Vital signs: Vitals:   02/23/18 2349 02/24/18 0548  BP: 104/67 124/68  Pulse: 75 63  Resp: 18 18  Temp: 98 F (36.7 C) 98.3 F (36.8 C)  SpO2: 95% 100%     General:   Appears somewhat older than stated age.in mild distress from pain. HEENT : NG tube in place  Lungs:  Clear throughout to auscultation.  Anterior exam only Heart:  Regular rate and rhythm; no murmurs, clicks, rubs,  or gallops. Abdomen: epigastric as well as bilateral upper quadrant tenderness to palpation, mild abdominal distention, bowel sounds present. No peritoneal signs Rectal:  Deferred  GI:  Lab Results: Recent Labs    02/23/18 1609 02/24/18 0218  WBC 10.3 9.7  HGB 14.2 12.3  HCT 44.5 38.2  PLT 374 308   BMET Recent Labs    02/23/18 1609 02/24/18 0218  NA 144 147*  K 3.5 3.0*  CL 103 103  CO2 31 31  GLUCOSE 157* 95  BUN 9 8  CREATININE 0.80 0.75  CALCIUM 9.8 9.3   LFT Recent Labs    02/24/18 0218  PROT 5.9*  ALBUMIN 3.3*  AST 15  ALT 11  ALKPHOS 47  BILITOT 0.8   PT/INR No results for input(s): LABPROT, INR in the last 72 hours.   Studies/Results: Ct Abdomen Pelvis W Contrast  Result Date: 02/23/2018 CLINICAL DATA:  Mid to lower  abdominal pain. EXAM: CT ABDOMEN AND PELVIS WITH CONTRAST TECHNIQUE: Multidetector CT imaging of the abdomen and pelvis was performed using the standard protocol following bolus administration of intravenous contrast. CONTRAST:  100mL OMNIPAQUE IOHEXOL 300 MG/ML  SOLN COMPARISON:  None. FINDINGS: Lower chest: Herniated colon is identified anterior to the liver and heart. Hepatobiliary: No focal abnormality within the liver parenchyma. There is no evidence for gallstones, gallbladder wall thickening, or pericholecystic fluid. No intrahepatic or extrahepatic biliary dilation. Pancreas: No focal mass lesion. No dilatation of the main duct. No intraparenchymal   cyst. No peripancreatic edema. Spleen: No splenomegaly. No focal mass lesion. Adrenals/Urinary Tract: No adrenal nodule or mass. Right kidney unremarkable. 6 mm low-density lesion upper pole left kidney is too small to characterize but likely a cyst. No evidence for hydroureter. The urinary bladder appears normal for the degree of distention. Stomach/Bowel: Stomach is massively distended. There is substantial mass-effect on the pyloric region of the stomach (axial image 21 series 3 and coronal image 48 of series 6. Duodenum is moderately distended. Jejunal loops and ileal loops are nondilated. The terminal ileum is normal. The appendix is not visualized, but there is no edema or inflammation in the region of the cecum. Ascending colon unremarkable. Transverse colon extends up into a hernia involving the anterior mediastinum with nondilated segments of transverse colon identified anterior to the heart. Transverse colon than exits down through the hernia defect along the duodenum and then courses posterior to the stomach. Colon can be identified between the stomach and the portal splenic confluence on image 29 of series 3. Distal colon is decompressed and unremarkable. Vascular/Lymphatic: No abdominal aortic aneurysm. No abdominal aortic atherosclerotic  calcification. Portal vein and superior mesenteric vein are patent. There is no gastrohepatic or hepatoduodenal ligament lymphadenopathy. No intraperitoneal or retroperitoneal lymphadenopathy. No pelvic sidewall lymphadenopathy. Reproductive: The uterus has normal CT imaging appearance. There is no adnexal mass. Other: No intraperitoneal free fluid. Musculoskeletal: No worrisome lytic or sclerotic osseous abnormality. sclerotic focus in the posterior sacrum is likely a bone island. IMPRESSION: Stomach is markedly distended and fluid-filled with evidence of mass-effect on the antral region. This finding is related to herniation of omentum and transverse colon up into the anterior mediastinum, anterior to the heart. The herniated fat and colon generates substantial mass-effect on the distal stomach and proximal duodenum which occupy a high position in the anterior abdomen, in this mass-effect appears to result in a gastric outlet obstruction. There is no evidence of gastric wall thickening, colonic obstruction, or colonic wall thickening. A tiny amount of free fluid is identified in the anterior mediastinal hernia. I discussed these results by telephone at the time of interpretation on 02/23/2018 at 7:57 pm with LESLIE SOFIA, PA . Electronically Signed   By: Eric  Mansell M.D.   On: 02/23/2018 19:59   Dg Chest Portable 1 View  Result Date: 02/23/2018 CLINICAL DATA:  NG tube placement EXAM: PORTABLE CHEST 1 VIEW COMPARISON:  None. FINDINGS: Enteric tube is been placed. The tip is off the field of view but well below the left hemidiaphragm. Shallow inspiration with linear atelectasis in the lung bases. Heart size and pulmonary vascularity are normal. No airspace disease or consolidation in the lungs. No blunting of costophrenic angles. No pneumothorax. Mediastinal contours appear intact. S-shaped thoracolumbar scoliosis. IMPRESSION: Enteric tube tip is off the field of view but below the left hemidiaphragm. Shallow  inspiration with linear atelectasis in the lung bases. Thoracolumbar scoliosis. Electronically Signed   By: William  Stevens M.D.   On: 02/23/2018 21:19   Dg Abd Portable 1v  Result Date: 02/24/2018 CLINICAL DATA:  Nasogastric tube placement EXAM: PORTABLE ABDOMEN - 1 VIEW COMPARISON:  CT abdomen and pelvis February 23, 2018 FINDINGS: Nasogastric tube tip and side port in stomach. There is stool in the right colon. There is a paucity of gas overall. There is no bowel dilatation or air-fluid level to suggest bowel obstruction. No evident free air. There is contrast in the urinary bladder. IMPRESSION: Nasogastric tube tip and side port in stomach. Overall paucity of   gas. While this finding may be seen normally, it may also be indicative of enteritis or early ileus. Bowel obstruction not felt to be likely. Electronically Signed   By: William  Woodruff III M.D.   On: 02/24/2018 10:17    Impression/Plan: - gastric outlet obstruction probably from herniation of omentum and transverse colon  into anterior mediastinum. GI is consulted for possible endoscopy to rule out any intragastric / duodenal lesions.  - Prader-Willi syndrome  Recommendations -------------------------- - Continue NG suction for now. - Plan for EGD tomorrow.  Risks (bleeding, infection, bowel perforation that could require surgery, sedation-related changes in cardiopulmonary systems), benefits (identification and possible treatment of source of symptoms, exclusion of certain causes of symptoms), and alternatives (watchful waiting, radiographic imaging studies, empiric medical treatment)  were explained to family in detail and they wishes to proceed.   LOS: 1 day   Bernadine Melecio  MD, FACP 02/24/2018, 12:23 PM  Contact #  [phone] 

## 2018-02-24 NOTE — Progress Notes (Signed)
MD on call notified that patient is having bad abdominal pain no N/V. Ilean SkillVeronica Shaneil Yazdi LPN

## 2018-02-24 NOTE — Consult Note (Signed)
Referring Provider: [facility] Primary Care Physician:  Patient, No Pcp Per Primary Gastroenterologist:  unassigned  Reason for Consultation:  Gastric outlet obstruction  HPI: Barbara Ward is a 47 y.o. female with past medical history of Prader-Willi syndrome, cognitive impairment presented to the hospital with abdominal pain. CT scan upon initial evaluation showed gastric outlet obstruction probably from herniation of omentum and transverse colon up into anterior mediastinum. GI is consulted for possible endoscopy to rule out any intragastric / duodenal lesions.   Patient seen and examined at bedside. Multiple family members at bedside. She was relatively at her baseline until yesterday and she started experiencing severe abdominal pain. Currently has NG tube in place. Minimal output. She continues to have abdominal pain . No black tarry stool or bright blood per rectum.  Past Medical History:  Diagnosis Date  . Allergic rhinitis   . Hyperlipidemia   . Hypokalemia   . MR (mental retardation)   . Prader-Willi syndrome   . Sleep apnea   . Thyroid disease     Past Surgical History:  Procedure Laterality Date  . HERNIA REPAIR    . THYROIDECTOMY      Prior to Admission medications   Medication Sig Start Date End Date Taking? Authorizing Provider  busPIRone (BUSPAR) 5 MG tablet Take 5 mg by mouth 2 (two) times daily.   Yes [provider]  Calcium Polycarbophil (FIBER-CAPS PO) Take 1 capsule by mouth 2 (two) times daily.   Yes [provider]  citalopram (CELEXA) 40 MG tablet Take 40 mg by mouth daily.   Yes [provider]  docusate sodium (COLACE) 100 MG capsule Take 100 mg by mouth every evening.   Yes [provider]  fluticasone (FLONASE) 50 MCG/ACT nasal spray Place 1 spray into both nostrils every evening.    Yes [provider]  furosemide (LASIX) 40 MG tablet Take 40-80 mg by mouth 2 (two) times daily. Take 2 tabs (80mg ) in the morning, and  1 tab (40mg ) in the evening.   Yes [provider]  glucosamine-chondroitin 500-400 MG tablet Take 1 tablet by mouth every morning.   Yes [provider]  levothyroxine (SYNTHROID, LEVOTHROID) 137 MCG tablet Take 137 mcg by mouth daily before breakfast.   Yes [provider]  loratadine (CLARITIN) 10 MG tablet Take 10 mg by mouth every morning.   Yes [provider]  lovastatin (MEVACOR) 20 MG tablet Take 20 mg by mouth every evening.   Yes [provider]  Multiple Vitamin (MULTIVITAMIN WITH MINERALS) TABS tablet Take 1 tablet by mouth every morning.   Yes [provider]  potassium chloride (K-DUR) 10 MEQ tablet Take 10 mEq by mouth every morning.   Yes [provider]  ramipril (ALTACE) 10 MG capsule Take 10 mg by mouth every morning.   Yes [provider]    Scheduled Meds: Continuous Infusions: . lactated ringers with kcl 100 mL/hr at 02/24/18 0426  . potassium chloride 10 mEq (02/24/18 1131)   PRN Meds:.fentaNYL (SUBLIMAZE) injection  Allergies as of 02/23/2018 - Review Complete 02/23/2018  Allergen Reaction Noted  . Codeine Other (See Comments) 02/18/2014  . Penicillins Other (See Comments) 02/18/2014  . Tape Other (See Comments) 02/18/2014    History reviewed. No pertinent family history.  Social History   Socioeconomic History  . Marital status: Single    Spouse name: Not on file  . Number of children: Not on file  . Years of education: Not on file  .  Highest education level: Not on file  Occupational History  . Not on file  Social Needs  . Financial resource strain: Not on file  . Food insecurity:    Worry: Not on file    Inability: Not on file  . Transportation needs:    Medical: Not on file    Non-medical: Not on file  Tobacco Use  . Smoking status: Never Smoker  . Smokeless tobacco: Never Used  Substance and Sexual Activity  . Alcohol use: No  . Drug use: No  . Sexual activity: Not  on file  Lifestyle  . Physical activity:    Days per week: Not on file    Minutes per session: Not on file  . Stress: Not on file  Relationships  . Social connections:    Talks on phone: Not on file    Gets together: Not on file    Attends religious service: Not on file    Active member of club or organization: Not on file    Attends meetings of clubs or organizations: Not on file    Relationship status: Not on file  . Intimate partner violence:    Fear of current or ex partner: Not on file    Emotionally abused: Not on file    Physically abused: Not on file    Forced sexual activity: Not on file  Other Topics Concern  . Not on file  Social History Narrative  . Not on file    Review of Systems: not able to obtain.  Physical Exam: Vital signs: Vitals:   02/23/18 2349 02/24/18 0548  BP: 104/67 124/68  Pulse: 75 63  Resp: 18 18  Temp: 98 F (36.7 C) 98.3 F (36.8 C)  SpO2: 95% 100%     General:   Appears somewhat older than stated age.in mild distress from pain. HEENT : NG tube in place  Lungs:  Clear throughout to auscultation.  Anterior exam only Heart:  Regular rate and rhythm; no murmurs, clicks, rubs,  or gallops. Abdomen: epigastric as well as bilateral upper quadrant tenderness to palpation, mild abdominal distention, bowel sounds present. No peritoneal signs Rectal:  Deferred  GI:  Lab Results: Recent Labs    02/23/18 1609 02/24/18 0218  WBC 10.3 9.7  HGB 14.2 12.3  HCT 44.5 38.2  PLT 374 308   BMET Recent Labs    02/23/18 1609 02/24/18 0218  NA 144 147*  K 3.5 3.0*  CL 103 103  CO2 31 31  GLUCOSE 157* 95  BUN 9 8  CREATININE 0.80 0.75  CALCIUM 9.8 9.3   LFT Recent Labs    02/24/18 0218  PROT 5.9*  ALBUMIN 3.3*  AST 15  ALT 11  ALKPHOS 47  BILITOT 0.8   PT/INR No results for input(s): LABPROT, INR in the last 72 hours.   Studies/Results: Ct Abdomen Pelvis W Contrast  Result Date: 02/23/2018 CLINICAL DATA:  Mid to lower  abdominal pain. EXAM: CT ABDOMEN AND PELVIS WITH CONTRAST TECHNIQUE: Multidetector CT imaging of the abdomen and pelvis was performed using the standard protocol following bolus administration of intravenous contrast. CONTRAST:  100mL OMNIPAQUE IOHEXOL 300 MG/ML  SOLN COMPARISON:  None. FINDINGS: Lower chest: Herniated colon is identified anterior to the liver and heart. Hepatobiliary: No focal abnormality within the liver parenchyma. There is no evidence for gallstones, gallbladder wall thickening, or pericholecystic fluid. No intrahepatic or extrahepatic biliary dilation. Pancreas: No focal mass lesion. No dilatation of the main duct. No intraparenchymal  cyst. No peripancreatic edema. Spleen: No splenomegaly. No focal mass lesion. Adrenals/Urinary Tract: No adrenal nodule or mass. Right kidney unremarkable. 6 mm low-density lesion upper pole left kidney is too small to characterize but likely a cyst. No evidence for hydroureter. The urinary bladder appears normal for the degree of distention. Stomach/Bowel: Stomach is massively distended. There is substantial mass-effect on the pyloric region of the stomach (axial image 21 series 3 and coronal image 48 of series 6. Duodenum is moderately distended. Jejunal loops and ileal loops are nondilated. The terminal ileum is normal. The appendix is not visualized, but there is no edema or inflammation in the region of the cecum. Ascending colon unremarkable. Transverse colon extends up into a hernia involving the anterior mediastinum with nondilated segments of transverse colon identified anterior to the heart. Transverse colon than exits down through the hernia defect along the duodenum and then courses posterior to the stomach. Colon can be identified between the stomach and the portal splenic confluence on image 29 of series 3. Distal colon is decompressed and unremarkable. Vascular/Lymphatic: No abdominal aortic aneurysm. No abdominal aortic atherosclerotic  calcification. Portal vein and superior mesenteric vein are patent. There is no gastrohepatic or hepatoduodenal ligament lymphadenopathy. No intraperitoneal or retroperitoneal lymphadenopathy. No pelvic sidewall lymphadenopathy. Reproductive: The uterus has normal CT imaging appearance. There is no adnexal mass. Other: No intraperitoneal free fluid. Musculoskeletal: No worrisome lytic or sclerotic osseous abnormality. sclerotic focus in the posterior sacrum is likely a bone island. IMPRESSION: Stomach is markedly distended and fluid-filled with evidence of mass-effect on the antral region. This finding is related to herniation of omentum and transverse colon up into the anterior mediastinum, anterior to the heart. The herniated fat and colon generates substantial mass-effect on the distal stomach and proximal duodenum which occupy a high position in the anterior abdomen, in this mass-effect appears to result in a gastric outlet obstruction. There is no evidence of gastric wall thickening, colonic obstruction, or colonic wall thickening. A tiny amount of free fluid is identified in the anterior mediastinal hernia. I discussed these results by telephone at the time of interpretation on 02/23/2018 at 7:57 pm with LESLIE SOFIA, PA . Electronically Signed   By: Kennith Center M.D.   On: 02/23/2018 19:59   Dg Chest Portable 1 View  Result Date: 02/23/2018 CLINICAL DATA:  NG tube placement EXAM: PORTABLE CHEST 1 VIEW COMPARISON:  None. FINDINGS: Enteric tube is been placed. The tip is off the field of view but well below the left hemidiaphragm. Shallow inspiration with linear atelectasis in the lung bases. Heart size and pulmonary vascularity are normal. No airspace disease or consolidation in the lungs. No blunting of costophrenic angles. No pneumothorax. Mediastinal contours appear intact. S-shaped thoracolumbar scoliosis. IMPRESSION: Enteric tube tip is off the field of view but below the left hemidiaphragm. Shallow  inspiration with linear atelectasis in the lung bases. Thoracolumbar scoliosis. Electronically Signed   By: Burman Nieves M.D.   On: 02/23/2018 21:19   Dg Abd Portable 1v  Result Date: 02/24/2018 CLINICAL DATA:  Nasogastric tube placement EXAM: PORTABLE ABDOMEN - 1 VIEW COMPARISON:  CT abdomen and pelvis February 23, 2018 FINDINGS: Nasogastric tube tip and side port in stomach. There is stool in the right colon. There is a paucity of gas overall. There is no bowel dilatation or air-fluid level to suggest bowel obstruction. No evident free air. There is contrast in the urinary bladder. IMPRESSION: Nasogastric tube tip and side port in stomach. Overall paucity of  gas. While this finding may be seen normally, it may also be indicative of enteritis or early ileus. Bowel obstruction not felt to be likely. Electronically Signed   By: Bretta Bang III M.D.   On: 02/24/2018 10:17    Impression/Plan: - gastric outlet obstruction probably from herniation of omentum and transverse colon  into anterior mediastinum. GI is consulted for possible endoscopy to rule out any intragastric / duodenal lesions.  - Prader-Willi syndrome  Recommendations -------------------------- - Continue NG suction for now. - Plan for EGD tomorrow.  Risks (bleeding, infection, bowel perforation that could require surgery, sedation-related changes in cardiopulmonary systems), benefits (identification and possible treatment of source of symptoms, exclusion of certain causes of symptoms), and alternatives (watchful waiting, radiographic imaging studies, empiric medical treatment)  were explained to family in detail and they wishes to proceed.   LOS: 1 day   Kathi Der  MD, FACP 02/24/2018, 12:23 PM  Contact #  [phone]

## 2018-02-24 NOTE — Progress Notes (Signed)
Family Medicine Teaching Service Daily Progress Note Intern Pager: [phone]  Patient name: Barbara Ward L Goodnough Medical record number: [id number] Date of birth: Sep 04, 1970 Age: 47 y.o. Gender: female  Primary Care Provider: Patient, No Pcp Per Consultants: GI, Surgery Code Status: Full  Pt Overview and Major Events to Date:  7/5 admitted to FPTS with Gastric Outlet Obstruction.   Assessment and Plan: [name] is a 47 year old female with history of Prader-Willi syndrome, mental retardation, sleep apnea, hypothyroid, abdominal hernia, hyperlipidemia, and bilateral lower extremity lymphedema with venous stasis presenting with 2 days of severe,  generalized abdominal pain radiating to her back following a cookout with her group home. On admission, pain was constant and severe, but drastically reduced with placement of NG tube and removal of 800 cc of emesis from the stomach.   Gastric Outlet Obstruction: CT scan showed a markedly distended stomach with transverse colon and omentum herniating into anterior mediastinum, causing gastric outlet obstruction. - Keep patient NPO - NG tube in place - output 800 cc - Surgery consulted, appreciate recs -  - Pain control with Fentanyl 50mcg q1hr PRN - monitor pain closely; patients with P-W have high pain thresholds - Hold all other PO meds until after NG tube removed - Will consult GI 7/6 for possible scope per surgery recs  Prader-Willi Syndrome: patient has the maturity and cognition of ~777-45 year old and is very sweet. PWS comes with an insatiable appetite, however she has been restricted to a 1200 calorie diet and has a BMI in the healthy range.   - Keep patient NPO until after NG tube removed  - Strict diet control at 1,200 calories/day. - Multivitamin and Calcium supplementation after PO restriction lifted  Hypothyroidism: most recent TSH unknown.  - Takes Levothyroxine 137mcg qDaily PO before breakfast - Hold Levothyroxine until after NG tube  removed. - obtain TSH with morning labs  Hypertension: BP stable, 124/69 7/6 - Lasix 40mg  PO qDaily (likely also for venous stasis) - Ramipril 10mg  capsule 10mg  PO qAM - Hold Lasix and Ramipril until after NG tube removed  HLD: previous lipid levels unknown - Lovastatin 20mg  PO qHS  - Hold Lovastatin until after NG tube removed - obtain lipid profile with morning labs  Anxiety: well-controlled with medications - Buspirone 5mg  tablet PO BID - Citalopram 40mg  tablet PO qDaily - Hold Buspirone and Citalopram until NG tube removed - Can consider IV benzo if patient anxious while NPO  Sleep Apnea: patient uses CPAP at night; however, current NG tube complicates mask placement - d/c CPAP until after NG tube removed  FEN/GI: NPO PPx: SCDs  Disposition: Pending clinical improvement   Subjective:  This morning patient was seen lying in bed in no acute distress, asking for food.  Her parents were in the room with her.  She denies fevers, chills, chest pain, nausea, vomiting, diarrhea, or bloody stools.  She continues to have severe intermittent bouts of nonradiating abdominal pain, which is reduced with fentanyl.  Objective: Temp:  [97.8 F (36.6 C)-98.6 F (37 C)] 98.3 F (36.8 C) (07/06 0548) Pulse Rate:  [63-87] 63 (07/06 0548) Resp:  [14-20] 18 (07/06 0548) BP: (101-128)/(50-75) 124/68 (07/06 0548) SpO2:  [93 %-100 %] 100 % (07/06 0548)  Physical Exam  Constitutional: She appears well-developed and well-nourished.  Non-toxic appearance.  HENT:  Head: Atraumatic.  Eyes: EOM are normal.  Cardiovascular: Normal rate, regular rhythm and normal heart sounds.  Pulmonary/Chest: Effort normal and breath sounds normal.  Abdominal: Soft. Normal appearance  and bowel sounds are normal.  Tender to touch, general  Neurological: She is alert.  Skin: Skin is warm and dry.  Psychiatric: Her behavior is normal.     Laboratory: Recent Labs  Lab 02/23/18 1609 02/24/18 0218  WBC  10.3 9.7  HGB 14.2 12.3  HCT 44.5 38.2  PLT 374 308   Recent Labs  Lab 02/23/18 1609 02/24/18 0218  NA 144 147*  K 3.5 3.0*  CL 103 103  CO2 31 31  BUN 9 8  CREATININE 0.80 0.75  CALCIUM 9.8 9.3  PROT 7.0 5.9*  BILITOT 0.8 0.8  ALKPHOS 61 47  ALT 14 11  AST 17 15  GLUCOSE 157* 95    Imaging/Diagnostic Tests: Ct Abdomen Pelvis W Contrast  Result Date: 02/23/2018 CLINICAL DATA:  Mid to lower abdominal pain. EXAM: CT ABDOMEN AND PELVIS WITH CONTRAST TECHNIQUE: Multidetector CT imaging of the abdomen and pelvis was performed using the standard protocol following bolus administration of intravenous contrast. CONTRAST:  OMNIPAQUE IOHEXOL 300 MG/ML  SOLN COMPARISON:  None. FINDINGS: Lower chest: Herniated colon is identified anterior to the liver and heart. Hepatobiliary: No focal abnormality within the liver parenchyma. There is no evidence for gallstones, gallbladder wall thickening, or pericholecystic fluid. No intrahepatic or extrahepatic biliary dilation. Pancreas: No focal mass lesion. No dilatation of the main duct. No intraparenchymal cyst. No peripancreatic edema. Spleen: No splenomegaly. No focal mass lesion. Adrenals/Urinary Tract: No adrenal nodule or mass. Right kidney unremarkable. 6 mm low-density lesion upper pole left kidney is too small to characterize but likely a cyst. No evidence for hydroureter. The urinary bladder appears normal for the degree of distention. Stomach/Bowel: Stomach is massively distended. There is substantial mass-effect on the pyloric region of the stomach (axial image 21 series 3 and coronal image 48 of series 6. Duodenum is moderately distended. Jejunal loops and ileal loops are nondilated. The terminal ileum is normal. The appendix is not visualized, but there is no edema or inflammation in the region of the cecum. Ascending colon unremarkable. Transverse colon extends up into a hernia involving the anterior mediastinum with nondilated segments of  transverse colon identified anterior to the heart. Transverse colon than exits down through the hernia defect along the duodenum and then courses posterior to the stomach. Colon can be identified between the stomach and the portal splenic confluence on image 29 of series 3. Distal colon is decompressed and unremarkable. Vascular/Lymphatic: No abdominal aortic aneurysm. No abdominal aortic atherosclerotic calcification. Portal vein and superior mesenteric vein are patent. There is no gastrohepatic or hepatoduodenal ligament lymphadenopathy. No intraperitoneal or retroperitoneal lymphadenopathy. No pelvic sidewall lymphadenopathy. Reproductive: The uterus has normal CT imaging appearance. There is no adnexal mass. Other: No intraperitoneal free fluid. Musculoskeletal: No worrisome lytic or sclerotic osseous abnormality. sclerotic focus in the posterior sacrum is likely a bone island. IMPRESSION: Stomach is markedly distended and fluid-filled with evidence of mass-effect on the antral region. This finding is related to herniation of omentum and transverse colon up into the anterior mediastinum, anterior to the heart. The herniated fat and colon generates substantial mass-effect on the distal stomach and proximal duodenum which occupy a high position in the anterior abdomen, in this mass-effect appears to result in a gastric outlet obstruction. There is no evidence of gastric wall thickening, colonic obstruction, or colonic wall thickening. A tiny amount of free fluid is identified in the anterior mediastinal hernia. I discussed these results by telephone at the time of interpretation on 02/23/2018 at  7:57 pm with LESLIE SOFIA, PA . Electronically Signed   By: Kennith Center M.D.   On: 02/23/2018 19:59   Dg Chest Portable 1 View  Result Date: 02/23/2018 CLINICAL DATA:  NG tube placement EXAM: PORTABLE CHEST 1 VIEW COMPARISON:  None. FINDINGS: Enteric tube is been placed. The tip is off the field of view but well below  the left hemidiaphragm. Shallow inspiration with linear atelectasis in the lung bases. Heart size and pulmonary vascularity are normal. No airspace disease or consolidation in the lungs. No blunting of costophrenic angles. No pneumothorax. Mediastinal contours appear intact. S-shaped thoracolumbar scoliosis. IMPRESSION: Enteric tube tip is off the field of view but below the left hemidiaphragm. Shallow inspiration with linear atelectasis in the lung bases. Thoracolumbar scoliosis. Electronically Signed   By: Burman Nieves M.D.   On: 02/23/2018 21:19    Dollene Cleveland, DO 02/24/2018, 8:35 AM PGY-1, [facility] FPTS Intern pager: [phone], text pages welcome

## 2018-02-24 NOTE — Progress Notes (Signed)
MD on call paged patient having pain no orders and patient is NPO. Ilean SkillVeronica Gizel Riedlinger LPN

## 2018-02-24 NOTE — Progress Notes (Signed)
[facility] Progress Note     Subjective: CC- GOO Sitting up in chair. States that she still has belly pain, currently better than when she came in but the pain is intermittent. Patient intermittently cries out in severe pain. NG tube placed with little output. Patient still feels bloated. Burping, no flatus.  Objective: Vital signs in last 24 hours: Temp:  [97.8 F (36.6 C)-98.6 F (37 C)] 98.3 F (36.8 C) (07/06 0548) Pulse Rate:  [63-87] 63 (07/06 0548) Resp:  [14-20] 18 (07/06 0548) BP: (101-128)/(50-75) 124/68 (07/06 0548) SpO2:  [93 %-100 %] 100 % (07/06 0548)    Intake/Output from previous day: 07/05 0701 - 07/06 0700 In: 436 [I.V.:242.2; IV Piggyback:193.9] Out: 1000 [Urine:100; Emesis/NG output:900] Intake/Output this shift: No intake/output data recorded.  PE: Gen:  Alert, NAD, pleasant HEENT: EOM's intact, pupils equal and round. NG tube in place Pulm:  effort normal Abd: Soft, distended, mild global tenderness/mostly in LUQ without rebound or guarding, +BS, no HSM, no hernia Ext: calves soft and nontender Neuro: answer questions appropriately and follows commands Skin: no rashes noted, warm and dry  Lab Results:  Recent Labs    02/23/18 1609 02/24/18 0218  WBC 10.3 9.7  HGB 14.2 12.3  HCT 44.5 38.2  PLT 374 308   BMET Recent Labs    02/23/18 1609 02/24/18 0218  NA 144 147*  K 3.5 3.0*  CL 103 103  CO2 31 31  GLUCOSE 157* 95  BUN 9 8  CREATININE 0.80 0.75  CALCIUM 9.8 9.3   PT/INR No results for input(s): LABPROT, INR in the last 72 hours. CMP     Component Value Date/Time   NA 147 (H) 02/24/2018 0218   K 3.0 (L) 02/24/2018 0218   CL 103 02/24/2018 0218   CO2 31 02/24/2018 0218   GLUCOSE 95 02/24/2018 0218   BUN 8 02/24/2018 0218   CREATININE 0.75 02/24/2018 0218   CALCIUM 9.3 02/24/2018 0218   PROT 5.9 (L) 02/24/2018 0218   ALBUMIN 3.3 (L) 02/24/2018 0218   AST 15 02/24/2018 0218   ALT 11 02/24/2018 0218    ALKPHOS 47 02/24/2018 0218   BILITOT 0.8 02/24/2018 0218   GFRNONAA >60 02/24/2018 0218   GFRAA >60 02/24/2018 0218   Lipase     Component Value Date/Time   LIPASE 26 02/23/2018 1609       Studies/Results: Ct Abdomen Pelvis W Contrast  Result Date: 02/23/2018 CLINICAL DATA:  Mid to lower abdominal pain. EXAM: CT ABDOMEN AND PELVIS WITH CONTRAST TECHNIQUE: Multidetector CT imaging of the abdomen and pelvis was performed using the standard protocol following bolus administration of intravenous contrast. CONTRAST:  100mL OMNIPAQUE IOHEXOL 300 MG/ML  SOLN COMPARISON:  None. FINDINGS: Lower chest: Herniated colon is identified anterior to the liver and heart. Hepatobiliary: No focal abnormality within the liver parenchyma. There is no evidence for gallstones, gallbladder wall thickening, or pericholecystic fluid. No intrahepatic or extrahepatic biliary dilation. Pancreas: No focal mass lesion. No dilatation of the main duct. No intraparenchymal cyst. No peripancreatic edema. Spleen: No splenomegaly. No focal mass lesion. Adrenals/Urinary Tract: No adrenal nodule or mass. Right kidney unremarkable. 6 mm low-density lesion upper pole left kidney is too small to characterize but likely a cyst. No evidence for hydroureter. The urinary bladder appears normal for the degree of distention. Stomach/Bowel: Stomach is massively distended. There is substantial mass-effect on the pyloric region of the stomach (axial image 21 series 3 and coronal image 48 of series 6.  Duodenum is moderately distended. Jejunal loops and ileal loops are nondilated. The terminal ileum is normal. The appendix is not visualized, but there is no edema or inflammation in the region of the cecum. Ascending colon unremarkable. Transverse colon extends up into a hernia involving the anterior mediastinum with nondilated segments of transverse colon identified anterior to the heart. Transverse colon than exits down through the hernia defect along  the duodenum and then courses posterior to the stomach. Colon can be identified between the stomach and the portal splenic confluence on image 29 of series 3. Distal colon is decompressed and unremarkable. Vascular/Lymphatic: No abdominal aortic aneurysm. No abdominal aortic atherosclerotic calcification. Portal vein and superior mesenteric vein are patent. There is no gastrohepatic or hepatoduodenal ligament lymphadenopathy. No intraperitoneal or retroperitoneal lymphadenopathy. No pelvic sidewall lymphadenopathy. Reproductive: The uterus has normal CT imaging appearance. There is no adnexal mass. Other: No intraperitoneal free fluid. Musculoskeletal: No worrisome lytic or sclerotic osseous abnormality. sclerotic focus in the posterior sacrum is likely a bone island. IMPRESSION: Stomach is markedly distended and fluid-filled with evidence of mass-effect on the antral region. This finding is related to herniation of omentum and transverse colon up into the anterior mediastinum, anterior to the heart. The herniated fat and colon generates substantial mass-effect on the distal stomach and proximal duodenum which occupy a high position in the anterior abdomen, in this mass-effect appears to result in a gastric outlet obstruction. There is no evidence of gastric wall thickening, colonic obstruction, or colonic wall thickening. A tiny amount of free fluid is identified in the anterior mediastinal hernia. I discussed these results by telephone at the time of interpretation on 02/23/2018 at 7:57 pm with LESLIE SOFIA, PA . Electronically Signed   By: Kennith Center M.D.   On: 02/23/2018 19:59   Dg Chest Portable 1 View  Result Date: 02/23/2018 CLINICAL DATA:  NG tube placement EXAM: PORTABLE CHEST 1 VIEW COMPARISON:  None. FINDINGS: Enteric tube is been placed. The tip is off the field of view but well below the left hemidiaphragm. Shallow inspiration with linear atelectasis in the lung bases. Heart size and pulmonary  vascularity are normal. No airspace disease or consolidation in the lungs. No blunting of costophrenic angles. No pneumothorax. Mediastinal contours appear intact. S-shaped thoracolumbar scoliosis. IMPRESSION: Enteric tube tip is off the field of view but below the left hemidiaphragm. Shallow inspiration with linear atelectasis in the lung bases. Thoracolumbar scoliosis. Electronically Signed   By: Burman Nieves M.D.   On: 02/23/2018 21:19    Anti-infectives: Anti-infectives (From admission, onward)   None       Assessment/Plan Prader-Willi Syndrome Hypothyroidism HTN HLD Anxiety Sleep apnea  Gastric outlet obstruction - CT scan showed a markedly dilated stomach.  There was transverse colon and omentum herniating through the diaphragm into the anterior mediastinum suggesting that this was coming across the distal stomach/duodenum creating the obstruction - may need a GI consultation to consider upper endoscopy in a few days once the stomach is decompressed to see if this truly looks like external compression versus an internal lesion or ulcer  ID - none FEN - IVF, NPO/NGT VTE - SCDs, ok for chemical DVT prophylaxis from surgical stantpoint Foley - wick  Plan - WBC WNL and VSS. Abdominal xray to check NG tube placement and gastric distension. Continue NPO/NGT.   LOS: 1 day    Franne Forts , [facility] 02/24/2018, 9:28 AM Pager: [phone] Consults: [phone] Mon 7:00 am -11:30 AM Francee Nodal  7:00 am-4:30 pm Sat-Sun 7:00 am-11:30 am

## 2018-02-25 ENCOUNTER — Encounter (HOSPITAL_COMMUNITY)
Admission: EM | Disposition: A | Discharge status: Home Health | Payer: Self-pay | Source: Home / Self Care | Attending: Family Medicine

## 2018-02-25 ENCOUNTER — Encounter (HOSPITAL_COMMUNITY): Payer: Self-pay | Admitting: Certified Registered Nurse Anesthetist

## 2018-02-25 ENCOUNTER — Inpatient Hospital Stay (HOSPITAL_COMMUNITY): Payer: Medicare Other | Admitting: Certified Registered Nurse Anesthetist

## 2018-02-25 HISTORY — PX: ESOPHAGOGASTRODUODENOSCOPY (EGD) WITH PROPOFOL: SHX5813

## 2018-02-25 LAB — CBC
HEMATOCRIT: 45.2 % (ref 36.0–46.0)
HEMOGLOBIN: 14 g/dL (ref 12.0–15.0)
MCH: 30.8 pg (ref 26.0–34.0)
MCHC: 31 g/dL (ref 30.0–36.0)
MCV: 99.6 fL (ref 78.0–100.0)
Platelets: 298 10*3/uL (ref 150–400)
RBC: 4.54 MIL/uL (ref 3.87–5.11)
RDW: 13.7 % (ref 11.5–15.5)
WBC: 14.2 10*3/uL — ABNORMAL HIGH (ref 4.0–10.5)

## 2018-02-25 LAB — LIPID PANEL
CHOLESTEROL: 192 mg/dL (ref 0–200)
HDL: 67 mg/dL (ref >40)
LDL Cholesterol: 113 mg/dL — ABNORMAL HIGH (ref 0–99)
TRIGLYCERIDES: 60 mg/dL (ref <150)
Total CHOL/HDL Ratio: 2.9 RATIO
VLDL: 12 mg/dL (ref 0–40)

## 2018-02-25 LAB — BASIC METABOLIC PANEL
ANION GAP: 13 (ref 5–15)
BUN: 13 mg/dL (ref 6–20)
CALCIUM: 9.3 mg/dL (ref 8.9–10.3)
CO2: 33 mmol/L — ABNORMAL HIGH (ref 22–32)
CREATININE: 1.02 mg/dL — AB (ref 0.44–1.00)
Chloride: 103 mmol/L (ref 98–111)
Glucose, Bld: 114 mg/dL — ABNORMAL HIGH (ref 70–99)
Potassium: 4.4 mmol/L (ref 3.5–5.1)
Sodium: 149 mmol/L — ABNORMAL HIGH (ref 135–145)

## 2018-02-25 LAB — TSH: TSH: 0.885 u[IU]/mL (ref 0.350–4.500)

## 2018-02-25 SURGERY — ESOPHAGOGASTRODUODENOSCOPY (EGD) WITH PROPOFOL
Anesthesia: Monitor Anesthesia Care

## 2018-02-25 MED ORDER — FENTANYL CITRATE (PF) 100 MCG/2ML IJ SOLN: 12.5000 ug | INTRAMUSCULAR | Status: DC | PRN

## 2018-02-25 MED ORDER — ACETAMINOPHEN 10 MG/ML IV SOLN
1000.0000 mg | Freq: Four times a day (QID) | INTRAVENOUS | Status: AC
  Administered 2018-02-25 – 2018-02-26 (×4): 1000 mg via INTRAVENOUS
  Filled 2018-02-25 (×4): qty 100

## 2018-02-25 MED ORDER — LIDOCAINE HCL (CARDIAC) PF 100 MG/5ML IV SOSY
PREFILLED_SYRINGE | INTRAVENOUS | Status: DC | PRN
  Administered 2018-02-25: 40 mg via INTRAVENOUS

## 2018-02-25 MED ORDER — NALOXONE HCL 0.4 MG/ML IJ SOLN
INTRAMUSCULAR | Status: AC
  Administered 2018-02-25: 0.2 mg
  Filled 2018-02-25: qty 1

## 2018-02-25 MED ORDER — PROPOFOL 10 MG/ML IV BOLUS
INTRAVENOUS | Status: DC | PRN
  Administered 2018-02-25 (×6): 20 mg via INTRAVENOUS

## 2018-02-25 MED ORDER — PROPOFOL 500 MG/50ML IV EMUL
INTRAVENOUS | Status: DC | PRN
  Administered 2018-02-25: 100 ug/kg/min via INTRAVENOUS

## 2018-02-25 MED ORDER — FENTANYL CITRATE (PF) 100 MCG/2ML IJ SOLN
25.0000 ug | INTRAMUSCULAR | Status: DC | PRN
  Administered 2018-02-25 – 2018-03-04 (×32): 25 ug via INTRAVENOUS
  Filled 2018-02-25 (×32): qty 2

## 2018-02-25 MED ORDER — LACTATED RINGERS IV SOLN
INTRAVENOUS | Status: DC | PRN
  Administered 2018-02-25: 10:00:00 via INTRAVENOUS

## 2018-02-25 SURGICAL SUPPLY — 15 items

## 2018-02-25 NOTE — Progress Notes (Signed)
FPTS Interim Progress Note  S: Patient with episode of unresponsiveness following 0459 dose of fentanyl 100mcg. Patient had previously tolerated this dose. Per RN 5 min after dose of fentanyl patient was unresponsive and RRT was called. RRT gave patient 0.2mg  narcan and patient became responsive. O2 sats during unresponsive episode 72-74% on RA. Primary MD team was paged at 0532 and I came to bedside to evaluate patient. Per patient's mother patient began to urinate and then looked like she was "jerking". Patient then turned blue and was not responding to her name. Mother then called RN who initiated RRT. Per mother patient is back at baseline.   O: BP 136/82 (BP Location: Right Wrist)   Pulse 75   Temp 98.7 F (37.1 C) (Oral)   Resp 16   SpO2 100%   Gen: awake and alert, crying in pain, able to answer all questions and follow commands  HEENT: PERRL Neuro: AAOx3, sensation intact bilaterally, able to follow commands and move all extremities spontaneously (limited ROM in lower extremities 2/2 pain)  A/P: Unresponsiveness Likely 2/2 increased fentanyl dose -narcan PRN -decrease fentanyl to 25mcg q2hrs -continuous pulse ox + end tidal CO2  Barbara ManisAbraham, Barbara Schwier, DO 02/25/2018, 6:43 AM PGY-2, [facility] pager [phone]

## 2018-02-25 NOTE — Transfer of Care (Signed)
Immediate Anesthesia Transfer of Care Note  Patient: Unknown L Betts  Procedure(s) Performed: ESOPHAGOGASTRODUODENOSCOPY (EGD) WITH PROPOFOL (N/A )  Patient Location: Endoscopy Unit  Anesthesia Type:MAC  Level of Consciousness: drowsy, patient cooperative and responds to stimulation  Airway & Oxygen Therapy: Patient Spontanous Breathing and Patient connected to nasal cannula oxygen  Post-op Assessment: Report given to RN and Post -op Vital signs reviewed and stable  Post vital signs: Reviewed and stable  Last Vitals:  Vitals Value Taken Time  BP 127/49 02/25/2018 10:52 AM  Temp 36.9 C 02/25/2018 10:51 AM  Pulse 76 02/25/2018 10:53 AM  Resp 20 02/25/2018 10:53 AM  SpO2 96 % 02/25/2018 10:53 AM  Vitals shown include unvalidated device data.  Last Pain:  Vitals:   02/25/18 1051  TempSrc: Axillary  PainSc: 0-No pain         Complications: No apparent anesthesia complications

## 2018-02-25 NOTE — Anesthesia Preprocedure Evaluation (Signed)
Anesthesia Evaluation  Patient identified by MRN, date of birth, ID band Patient awake    Reviewed: Allergy & Precautions, NPO status , Patient's Chart, lab work & pertinent test results  Airway Mallampati: II  TM Distance: >3 FB   Mouth opening: Limited Mouth Opening  Dental  (+) Poor Dentition   Pulmonary    breath sounds clear to auscultation       Cardiovascular  Rhythm:Regular Rate:Normal     Neuro/Psych    GI/Hepatic   Endo/Other    Renal/GU      Musculoskeletal   Abdominal   Peds  Hematology   Anesthesia Other Findings   Reproductive/Obstetrics                             Anesthesia Physical Anesthesia Plan  ASA: III  Anesthesia Plan: MAC   Post-op Pain Management:    Induction: Intravenous  PONV Risk Score and Plan: Ondansetron and Dexamethasone  Airway Management Planned: Natural Airway and Nasal Cannula  Additional Equipment:   Intra-op Plan:   Post-operative Plan:   Informed Consent: I have reviewed the patients History and Physical, chart, labs and discussed the procedure including the risks, benefits and alternatives for the proposed anesthesia with the patient or authorized representative who has indicated his/her understanding and acceptance.     Plan Discussed with: CRNA and Anesthesiologist  Anesthesia Plan Comments:         Anesthesia Quick Evaluation

## 2018-02-25 NOTE — Anesthesia Postprocedure Evaluation (Signed)
Anesthesia Post Note  Patient: Asheton L Derouin  Procedure(s) Performed: ESOPHAGOGASTRODUODENOSCOPY (EGD) WITH PROPOFOL (N/A )     Patient location during evaluation: Endoscopy Anesthesia Type: MAC Level of consciousness: awake and alert Pain management: pain level controlled Vital Signs Assessment: post-procedure vital signs reviewed and stable Respiratory status: spontaneous breathing, nonlabored ventilation, respiratory function stable and patient connected to nasal cannula oxygen Cardiovascular status: stable and blood pressure returned to baseline Postop Assessment: no apparent nausea or vomiting Anesthetic complications: no    Last Vitals:  Vitals:   02/25/18 1055 02/25/18 1131  BP: (!) 127/49 126/75  Pulse: 73 68  Resp: (!) 21   Temp:  37.3 C  SpO2: 95% 99%    Last Pain:  Vitals:   02/25/18 1131  TempSrc: Axillary  PainSc:                  Ashtin Melichar COKER

## 2018-02-25 NOTE — Progress Notes (Signed)
Patient given 100 mcg of fetanyl and found unresponsive 5 minutes later. Oxygen saturation 73 on room air and put on [place]. RRT nurse gave Narcan. Patient back to baseline but put her on continuous pulse ox and will closely monitor. Text paged family medicine on call about the incident. Dr Darin EngelsAbraham on her way to check on the patient. Will continue to closely monitor

## 2018-02-25 NOTE — Plan of Care (Signed)
  Problem: Education: Goal: Knowledge of General Education information will improve Outcome: Progressing   Problem: Clinical Measurements: Goal: Ability to maintain clinical measurements within normal limits will improve Outcome: Progressing   Problem: Coping: Goal: Level of anxiety will decrease Outcome: Progressing   Problem: Pain Managment: Goal: General experience of comfort will improve Outcome: Progressing   

## 2018-02-25 NOTE — Progress Notes (Signed)
Report given to Misty StanleyLisa, RN in Endo.

## 2018-02-25 NOTE — Interval H&P Note (Signed)
History and Physical Interval Note:  02/25/2018 9:47 AM  Barbara Ward  has presented today for surgery, with the diagnosis of Gastric outlet obstruction  The various methods of treatment have been discussed with the patient and family. After consideration of risks, benefits and other options for treatment, the patient has consented to  Procedure(s): ESOPHAGOGASTRODUODENOSCOPY (EGD) WITH PROPOFOL (N/A) as a surgical intervention .  The patient's history has been reviewed, patient examined, no change in status, stable for surgery.  I have reviewed the patient's chart and labs.  Questions were answered to the patient's satisfaction.    Recent events noted regarding oversedation requiring reversal with Narcan. Family updated at bedside. She is currently stable for EGD.  Risks (bleeding, infection, bowel perforation that could require surgery, sedation-related changes in cardiopulmonary systems), benefits (identification and possible treatment of source of symptoms, exclusion of certain causes of symptoms), and alternatives (watchful waiting, radiographic imaging studies, empiric medical treatment)  were explained to family in detail and patient wishes to proceed.  Buell Parcel

## 2018-02-25 NOTE — Progress Notes (Signed)
Family Medicine Teaching Service Daily Progress Note Intern Pager: [phone]  Patient name: Barbara Ward L Brow Medical record number: [id number] Date of birth: 27-Feb-1971 Age: 47 y.o. Gender: female  Primary Care Provider: Patient, No Pcp Per Consultants: GI, Gen surgery  Code Status: Full   Pt Overview and Major Events to Date:  Admitted to FPTS on 02/23/18  Assessment and Plan: [name] is a 47 year old female with history of Prader-Willi syndrome, mental retardation, sleep apnea, hypothyroid, abdominal hernia, hyperlipidemia, and bilateral lower extremity lymphedema with venous stasis presenting with 2 days of severe,  generalized abdominal pain radiating to her back.   Gastric Outlet Obstruction CT scan on admission showing markedly distended stomach withtransverse colon and omentum herniating into anterior mediastinum, causinggastricoutletobstruction. - NPO - NG tube in place  - Surgery consulted, appreciate recs  - Pain control with Fentanyl 50mcg q1hr PRN - monitor pain closely; patients with P-W have high pain thresholds - Hold all other PO meds until after NG tube removed - GI consulted, appreciate recommendations: plan for EGD on 02/25/18  Unresponsive episode Patient with episode of unresponsiveness following fentanyl dose at 0459. RRT arrived and gave patient narcan. Will plan to decrease pain regimen -decrease fentanyl to 25mcg q2hrs -continuous pulse ox + end tidal CO2 -monitor closely   Prader-WilliSyndrome Patient hasthematurityand cognition of ~577-45 year old. PWS comes with an insatiable appetite,however she has been restricted to a 1200 calorie diet and has a BMI in the healthy range.  - Keep patient NPO until after NG tube removed  - Strict diet control at 1,200 calories/day - Multivitamin and Calcium supplementation after PO restriction lifted  Hypothyroidism Home meds: Levothyroxine 137mcg qDaily PO before breakfast - Hold Levothyroxine until after  NG tube removed. - measure TSH  Hypertension Stable. BP 136/82. Home meds: Lasix 40mg  PO qDaily(likely also for venous stasis), Ramipril 10mg  capsule 10mg  PO qAM - Hold Lasix and Ramipril until after NG tube removed  HLD  Home meds: Lovastatin 20mg  PO qHS  - Hold Lovastatin until after NG tube removed - obtain lipid panel    Anxiety Well-controlled with medications. Home meds: Buspirone 5mg  tablet PO BID, Citalopram 40mg  tablet PO qDaily - Hold Buspirone and Citalopram until NG tube removed - Can consider IV benzo if patient anxious while NPO  Sleep Apnea Patient uses CPAP at night; however, current NG tube complicates mask placement - d/c CPAP until after NG tube removed  FEN/GI: NPO PPx: SCDs  Disposition: awaiting EGD   Subjective:  Patient crying in pain. States abdomen is source of pain. Patient's parents very concerned as they would like her pain controlled but are nervous since she was non-responsive with previous fentanyl dose. Parents are very hopeful that GI will perform EGD today to resolve source of pain.   Objective: Temp:  [98.7 F (37.1 C)-99.1 F (37.3 C)] 98.7 F (37.1 C) (07/06 2100) Pulse Rate:  [64-75] 75 (07/06 2100) Resp:  [16] 16 (07/06 2100) BP: (130-136)/(69-82) 136/82 (07/06 2100) SpO2:  [98 %-100 %] 100 % (07/06 2100) Physical Exam: General: awake and alert, crying throughout exam  HEENT: PERRL Cardiovascular: RRR, no MRG Respiratory: CTAB, no wheezes, rales, or rhonchi  Abdomen: difficult exam due to guarding, bowel sounds normal in all quadrants, diffuse tenderness to palpation  Extremities: no edema, able to move spontaneously  Neuro: AAOx3, sensation intact bilaterally, able to follow commands and move all extremities spontaneously (limited ROM in lower extremities 2/2 pain)  Laboratory: Recent Labs  Lab 02/23/18 1609 02/24/18  0218  WBC 10.3 9.7  HGB 14.2 12.3  HCT 44.5 38.2  PLT 374 308   Recent Labs  Lab 02/23/18 1609  02/24/18 0218  NA 144 147*  K 3.5 3.0*  CL 103 103  CO2 31 31  BUN 9 8  CREATININE 0.80 0.75  CALCIUM 9.8 9.3  PROT 7.0 5.9*  BILITOT 0.8 0.8  ALKPHOS 61 47  ALT 14 11  AST 17 15  GLUCOSE 157* 95     Imaging/Diagnostic Tests: Ct Abdomen Pelvis W Contrast  Result Date: 02/23/2018 CLINICAL DATA:  Mid to lower abdominal pain. EXAM: CT ABDOMEN AND PELVIS WITH CONTRAST TECHNIQUE: Multidetector CT imaging of the abdomen and pelvis was performed using the standard protocol following bolus administration of intravenous contrast. CONTRAST:  OMNIPAQUE IOHEXOL 300 MG/ML  SOLN COMPARISON:  None. FINDINGS: Lower chest: Herniated colon is identified anterior to the liver and heart. Hepatobiliary: No focal abnormality within the liver parenchyma. There is no evidence for gallstones, gallbladder wall thickening, or pericholecystic fluid. No intrahepatic or extrahepatic biliary dilation. Pancreas: No focal mass lesion. No dilatation of the main duct. No intraparenchymal cyst. No peripancreatic edema. Spleen: No splenomegaly. No focal mass lesion. Adrenals/Urinary Tract: No adrenal nodule or mass. Right kidney unremarkable. 6 mm low-density lesion upper pole left kidney is too small to characterize but likely a cyst. No evidence for hydroureter. The urinary bladder appears normal for the degree of distention. Stomach/Bowel: Stomach is massively distended. There is substantial mass-effect on the pyloric region of the stomach (axial image 21 series 3 and coronal image 48 of series 6. Duodenum is moderately distended. Jejunal loops and ileal loops are nondilated. The terminal ileum is normal. The appendix is not visualized, but there is no edema or inflammation in the region of the cecum. Ascending colon unremarkable. Transverse colon extends up into a hernia involving the anterior mediastinum with nondilated segments of transverse colon identified anterior to the heart. Transverse colon than exits down through  the hernia defect along the duodenum and then courses posterior to the stomach. Colon can be identified between the stomach and the portal splenic confluence on image 29 of series 3. Distal colon is decompressed and unremarkable. Vascular/Lymphatic: No abdominal aortic aneurysm. No abdominal aortic atherosclerotic calcification. Portal vein and superior mesenteric vein are patent. There is no gastrohepatic or hepatoduodenal ligament lymphadenopathy. No intraperitoneal or retroperitoneal lymphadenopathy. No pelvic sidewall lymphadenopathy. Reproductive: The uterus has normal CT imaging appearance. There is no adnexal mass. Other: No intraperitoneal free fluid. Musculoskeletal: No worrisome lytic or sclerotic osseous abnormality. sclerotic focus in the posterior sacrum is likely a bone island. IMPRESSION: Stomach is markedly distended and fluid-filled with evidence of mass-effect on the antral region. This finding is related to herniation of omentum and transverse colon up into the anterior mediastinum, anterior to the heart. The herniated fat and colon generates substantial mass-effect on the distal stomach and proximal duodenum which occupy a high position in the anterior abdomen, in this mass-effect appears to result in a gastric outlet obstruction. There is no evidence of gastric wall thickening, colonic obstruction, or colonic wall thickening. A tiny amount of free fluid is identified in the anterior mediastinal hernia. I discussed these results by telephone at the time of interpretation on 02/23/2018 at 7:57 pm with LESLIE SOFIA, PA . Electronically Signed   By: Kennith Center M.D.   On: 02/23/2018 19:59   Dg Chest Portable 1 View  Result Date: 02/23/2018 CLINICAL DATA:  NG tube placement EXAM:  PORTABLE CHEST 1 VIEW COMPARISON:  None. FINDINGS: Enteric tube is been placed. The tip is off the field of view but well below the left hemidiaphragm. Shallow inspiration with linear atelectasis in the lung bases. Heart  size and pulmonary vascularity are normal. No airspace disease or consolidation in the lungs. No blunting of costophrenic angles. No pneumothorax. Mediastinal contours appear intact. S-shaped thoracolumbar scoliosis. IMPRESSION: Enteric tube tip is off the field of view but below the left hemidiaphragm. Shallow inspiration with linear atelectasis in the lung bases. Thoracolumbar scoliosis. Electronically Signed   By: Burman Nieves M.D.   On: 02/23/2018 21:19   Dg Abd Portable 1v  Result Date: 02/24/2018 CLINICAL DATA:  Nasogastric tube placement EXAM: PORTABLE ABDOMEN - 1 VIEW COMPARISON:  CT abdomen and pelvis February 23, 2018 FINDINGS: Nasogastric tube tip and side port in stomach. There is stool in the right colon. There is a paucity of gas overall. There is no bowel dilatation or air-fluid level to suggest bowel obstruction. No evident free air. There is contrast in the urinary bladder. IMPRESSION: Nasogastric tube tip and side port in stomach. Overall paucity of gas. While this finding may be seen normally, it may also be indicative of enteritis or early ileus. Bowel obstruction not felt to be likely. Electronically Signed   By: Bretta Bang III M.D.   On: 02/24/2018 10:17    Oralia Manis, DO 02/25/2018, 6:53 AM PGY-2, [facility] FPTS Intern pager: [phone], text pages welcome

## 2018-02-25 NOTE — Brief Op Note (Signed)
02/23/2018 - 02/25/2018  10:49 AM  PATIENT:  Barbara Ward  47 y.o. female  PRE-OPERATIVE DIAGNOSIS:  Gastric outlet obstruction  POST-OPERATIVE DIAGNOSIS:  normal  PROCEDURE:  Procedure(s): ESOPHAGOGASTRODUODENOSCOPY (EGD) WITH PROPOFOL (N/A)  SURGEON:  Surgeon(s) and Role:    * Shanece Cochrane, MD - Primary  Findings ---------- - A large amount of food (residue) in the stomach.more than 1000 cc of retained liquid was suctioned.  no internal lesions identified on endoscopy. Narrowing of the prepyloric stomach as well as pyloric channel and duodenal bulb probably from extrinsic compression. - Normal second portion of the duodenum  Recommendations -------------------------- - Keep nothing by mouth for now - further management per surgery - GI will sign off. Call us back if needed  Barbara DerParag Tyrisha Benninger MD, FACP 02/25/2018, 10:53 AM  Contact #  [phone]

## 2018-02-25 NOTE — Op Note (Signed)
[facility] Patient Name: Barbara Ward Procedure Date : 02/25/2018 MRN: [id number] Attending MD: Kathi Der , MD Date of Birth: 1971-05-27 CSN: [id number] Age: 47 Admit Type: Inpatient Procedure:                Upper GI endoscopy Indications:               Providers:                Kathi Der, MD, Norman Clay, RN, Verita Schneiders,                            Technician Referring MD:              Medicines:                 Complications:            No immediate complications. Estimated Blood Loss:     Estimated blood loss: none. Procedure:                Pre-Anesthesia Assessment:                           - Prior to the procedure, a History and Physical                            was performed, and patient medications and                            allergies were reviewed. The patient's tolerance of                            previous anesthesia was also reviewed. The risks                            and benefits of the procedure and the sedation                            options and risks were discussed with the patient.                            All questions were answered, and informed consent                            was obtained. Prior Anticoagulants: The patient has                            taken no previous anticoagulant or antiplatelet                            agents. ASA Grade Assessment: III - A patient with                            severe systemic disease. After reviewing the risks                            and benefits, the  patient was deemed in                            satisfactory condition to undergo the procedure.                           After obtaining informed consent, the endoscope was                            passed under direct vision. Throughout the                            procedure, the patient's blood pressure, pulse, and                            oxygen saturations were monitored continuously. The        EG-[id number] ([id number]) scope was introduced through the                            mouth, and advanced to the second part of duodenum.                            The upper GI endoscopy was technically difficult                            and complex due to the patient's poor cooperation                            and not able to open mouth wide enought to place                            the bite block. The procedure was performed without                            the use of bite-block. The patient tolerated the                            procedure well. Scope In: Scope Out: Findings:      The upper GI endoscopy was technically difficult and complex due to the       patient's poor cooperation and not able to open mouth wide enought to       place the bite block. The procedure was performed without the use of       bite-block.      NG tube was noted but the exam of the esophagus was otherwise normal.      A large amount of food (residue) was found in the gastric fundus and in       the gastric body. more than 1000 cc of retained liquid was suctioned.       narrowing of the prepyloric stomach as well as pyloric channel probably       from extrinsic compression noted but was able to advance scope without       significant difficulty.      Extrinsic compression on the stomach was found at  the pylorus.      The second portion of the duodenum was normal. there was mild narrowing       of the duodenal bulb and I was not able to examine duodenal bulb       properly.. No internal lesions identified on endoscopy.      - oral exam after completion of endoscopy showed no complication. Impression:               no internal lesions identified on endoscopy.                            Narrowing of the pyloric channel and duodenal bulb                            probably from extrinsic compression.                           - A large amount of food (residue) in the stomach.                           -  Extrinsic compression in the pylorus.                           - Normal second portion of the duodenum.                           - No specimens collected. Recommendation:           - Return patient to hospital ward for ongoing care.                           - NPO.                           - further management per surgery.                           - GI will sign off. Call us back if needed Procedure Code(s):        --- Professional ---                           [id number], Esophagogastroduodenoscopy, flexible,                            transoral; diagnostic, including collection of                            specimen(s) by brushing or washing, when performed                            (separate procedure) Diagnosis Code(s):        --- Professional ---                           K31.89, Other diseases of stomach and duodenum CPT copyright 2017 [employer]. All rights reserved. The codes documented in this report are preliminary and upon  coder review may  be revised to meet current compliance requirements. Kathi Der, MD Kathi Der, MD 02/25/2018 10:53:07 AM Number of Addenda: 0

## 2018-02-25 NOTE — Progress Notes (Signed)
[facility] Progress Note     Subjective: CC- abdominal pain Patient continues to have intermittent severe abdominal pain. NG tube in place. No BM or flatus. Burping. Rapid response called last night due to oversedation, received narcan and patient became arousable.  Objective: Vital signs in last 24 hours: Temp:  [98.5 F (36.9 C)-99.1 F (37.3 C)] 98.5 F (36.9 C) (07/07 0701) Pulse Rate:  [64-75] 73 (07/07 0701) Resp:  [15-16] 15 (07/07 0701) BP: (130-146)/(69-91) 146/91 (07/07 0701) SpO2:  [98 %-100 %] 100 % (07/07 0701)    Intake/Output from previous day: 07/06 0701 - 07/07 0700 In: 1995.7 [I.V.:1989.5; IV Piggyback:6.2] Out: 200 [Urine:200] Intake/Output this shift: No intake/output data recorded.  PE: Gen:  Alert, NAD, pleasant HEENT: EOM's intact, pupils equal and round. NG tube in place Pulm:  effort normal Abd: Soft, mild distension, mild global tenderness/mostly in LUQ without rebound or guarding, +BS, no HSM, no hernia Ext: calves soft and nontender Neuro: answer questions appropriately and follows commands Skin: no rashes noted, warm and dry  Lab Results:  Recent Labs    02/24/18 0218 02/25/18 0654  WBC 9.7 14.2*  HGB 12.3 14.0  HCT 38.2 45.2  PLT 308 298   BMET Recent Labs    02/24/18 0218 02/25/18 0654  NA 147* 149*  K 3.0* 4.4  CL 103 103  CO2 31 33*  GLUCOSE 95 114*  BUN 8 13  CREATININE 0.75 1.02*  CALCIUM 9.3 9.3   PT/INR No results for input(s): LABPROT, INR in the last 72 hours. CMP     Component Value Date/Time   NA 149 (H) 02/25/2018 0654   K 4.4 02/25/2018 0654   CL 103 02/25/2018 0654   CO2 33 (H) 02/25/2018 0654   GLUCOSE 114 (H) 02/25/2018 0654   BUN 13 02/25/2018 0654   CREATININE 1.02 (H) 02/25/2018 0654   CALCIUM 9.3 02/25/2018 0654   PROT 5.9 (L) 02/24/2018 0218   ALBUMIN 3.3 (L) 02/24/2018 0218   AST 15 02/24/2018 0218   ALT 11 02/24/2018 0218   ALKPHOS 47 02/24/2018 0218   BILITOT 0.8  02/24/2018 0218   GFRNONAA >60 02/25/2018 0654   GFRAA >60 02/25/2018 0654   Lipase     Component Value Date/Time   LIPASE 26 02/23/2018 1609       Studies/Results: Ct Abdomen Pelvis W Contrast  Result Date: 02/23/2018 CLINICAL DATA:  Mid to lower abdominal pain. EXAM: CT ABDOMEN AND PELVIS WITH CONTRAST TECHNIQUE: Multidetector CT imaging of the abdomen and pelvis was performed using the standard protocol following bolus administration of intravenous contrast. CONTRAST:  OMNIPAQUE IOHEXOL 300 MG/ML  SOLN COMPARISON:  None. FINDINGS: Lower chest: Herniated colon is identified anterior to the liver and heart. Hepatobiliary: No focal abnormality within the liver parenchyma. There is no evidence for gallstones, gallbladder wall thickening, or pericholecystic fluid. No intrahepatic or extrahepatic biliary dilation. Pancreas: No focal mass lesion. No dilatation of the main duct. No intraparenchymal cyst. No peripancreatic edema. Spleen: No splenomegaly. No focal mass lesion. Adrenals/Urinary Tract: No adrenal nodule or mass. Right kidney unremarkable. 6 mm low-density lesion upper pole left kidney is too small to characterize but likely a cyst. No evidence for hydroureter. The urinary bladder appears normal for the degree of distention. Stomach/Bowel: Stomach is massively distended. There is substantial mass-effect on the pyloric region of the stomach (axial image 21 series 3 and coronal image 48 of series 6. Duodenum is moderately distended. Jejunal loops and ileal loops are  nondilated. The terminal ileum is normal. The appendix is not visualized, but there is no edema or inflammation in the region of the cecum. Ascending colon unremarkable. Transverse colon extends up into a hernia involving the anterior mediastinum with nondilated segments of transverse colon identified anterior to the heart. Transverse colon than exits down through the hernia defect along the duodenum and then courses posterior  to the stomach. Colon can be identified between the stomach and the portal splenic confluence on image 29 of series 3. Distal colon is decompressed and unremarkable. Vascular/Lymphatic: No abdominal aortic aneurysm. No abdominal aortic atherosclerotic calcification. Portal vein and superior mesenteric vein are patent. There is no gastrohepatic or hepatoduodenal ligament lymphadenopathy. No intraperitoneal or retroperitoneal lymphadenopathy. No pelvic sidewall lymphadenopathy. Reproductive: The uterus has normal CT imaging appearance. There is no adnexal mass. Other: No intraperitoneal free fluid. Musculoskeletal: No worrisome lytic or sclerotic osseous abnormality. sclerotic focus in the posterior sacrum is likely a bone island. IMPRESSION: Stomach is markedly distended and fluid-filled with evidence of mass-effect on the antral region. This finding is related to herniation of omentum and transverse colon up into the anterior mediastinum, anterior to the heart. The herniated fat and colon generates substantial mass-effect on the distal stomach and proximal duodenum which occupy a high position in the anterior abdomen, in this mass-effect appears to result in a gastric outlet obstruction. There is no evidence of gastric wall thickening, colonic obstruction, or colonic wall thickening. A tiny amount of free fluid is identified in the anterior mediastinal hernia. I discussed these results by telephone at the time of interpretation on 02/23/2018 at 7:57 pm with LESLIE SOFIA, PA . Electronically Signed   By: Kennith Center M.D.   On: 02/23/2018 19:59   Dg Chest Portable 1 View  Result Date: 02/23/2018 CLINICAL DATA:  NG tube placement EXAM: PORTABLE CHEST 1 VIEW COMPARISON:  None. FINDINGS: Enteric tube is been placed. The tip is off the field of view but well below the left hemidiaphragm. Shallow inspiration with linear atelectasis in the lung bases. Heart size and pulmonary vascularity are normal. No airspace disease  or consolidation in the lungs. No blunting of costophrenic angles. No pneumothorax. Mediastinal contours appear intact. S-shaped thoracolumbar scoliosis. IMPRESSION: Enteric tube tip is off the field of view but below the left hemidiaphragm. Shallow inspiration with linear atelectasis in the lung bases. Thoracolumbar scoliosis. Electronically Signed   By: Burman Nieves M.D.   On: 02/23/2018 21:19   Dg Abd Portable 1v  Result Date: 02/24/2018 CLINICAL DATA:  Nasogastric tube placement EXAM: PORTABLE ABDOMEN - 1 VIEW COMPARISON:  CT abdomen and pelvis February 23, 2018 FINDINGS: Nasogastric tube tip and side port in stomach. There is stool in the right colon. There is a paucity of gas overall. There is no bowel dilatation or air-fluid level to suggest bowel obstruction. No evident free air. There is contrast in the urinary bladder. IMPRESSION: Nasogastric tube tip and side port in stomach. Overall paucity of gas. While this finding may be seen normally, it may also be indicative of enteritis or early ileus. Bowel obstruction not felt to be likely. Electronically Signed   By: Bretta Bang III M.D.   On: 02/24/2018 10:17    Anti-infectives: Anti-infectives (From admission, onward)   None       Assessment/Plan Prader-Willi Syndrome Hypothyroidism HTN HLD Anxiety Sleep apnea  Gastric outlet obstruction - CT scan showed a markedly dilated stomach. There was transverse colon and omentum herniating through the diaphragm into the  anterior mediastinum suggesting that this was coming across the distal stomach/duodenum creating the obstruction - GI following  ID - none FEN - IVF, NPO/NGT VTE - SCDs, ok for chemical DVT prophylaxis from surgical stantpoint Foley - wick  Plan - EGD today. Add IV tylenol for better pain control.   LOS: 2 days    Franne FortsBrooke A Meuth , [facility] 02/25/2018, 9:13 AM Pager: [phone] Consults: [phone] Mon 7:00 am -11:30 AM Tues-Fri  7:00 am-4:30 pm Sat-Sun 7:00 am-11:30 am

## 2018-02-25 NOTE — Anesthesia Procedure Notes (Signed)
Procedure Name: MAC Date/Time: 02/25/2018 10:19 AM Performed by: Oletta Lamas, CRNA Pre-anesthesia Checklist: Patient identified, Emergency Drugs available, Suction available and Patient being monitored Patient Re-evaluated:Patient Re-evaluated prior to induction Oxygen Delivery Method: Nasal cannula

## 2018-02-25 NOTE — Significant Event (Signed)
Rapid Response Event Note  Overview: Called for oversedated patient following Fentanyl administration Called:0511 Arrived: 0516  Initial Focused Assessment: Upon arrival, Ms. Gaye AlkenMelton was lying in bed, obtunded, RR 10, sats 72-74% on RA.  Pt was placed on [place] and sats improved to 87%.  Narcan 0.2 mg given with good response.  Pt is requiring frequent doses of fentanyl for pain control every hour.  Pt became arousable and back to baseline answering questions immediately following Narcan administration.  Interventions: -Narcan 0.2 mg -Continuous pulse oximetry -Oxygen per [place] for sats > 92%  Plan of Care (if not transferred): -Monitor for oversedation. Give additional Narcan if pt becomes somulent. -No narcotics for 90 mins per reversal policy -Notify primary svc -Modify pain management plan to avoid oversedation -MD notified and coming to bedside to assess patient  Event Summary: 0535- HR 85, Sats 97% on 4L [place]  Rose FillersRees, Lorenza Shakir W

## 2018-02-26 LAB — BASIC METABOLIC PANEL
Anion gap: 12 (ref 5–15)
BUN: 16 mg/dL (ref 6–20)
CHLORIDE: 105 mmol/L (ref 98–111)
CO2: 32 mmol/L (ref 22–32)
CREATININE: 0.99 mg/dL (ref 0.44–1.00)
Calcium: 8.7 mg/dL — ABNORMAL LOW (ref 8.9–10.3)
GFR calc Af Amer: >60 mL/min (ref >60)
GFR calc non Af Amer: >60 mL/min (ref >60)
Glucose, Bld: 64 mg/dL — ABNORMAL LOW (ref 70–99)
POTASSIUM: 5.2 mmol/L — AB (ref 3.5–5.1)
SODIUM: 149 mmol/L — AB (ref 135–145)

## 2018-02-26 LAB — CBC
HEMATOCRIT: 41.7 % (ref 36.0–46.0)
HEMOGLOBIN: 13.2 g/dL (ref 12.0–15.0)
MCH: 30.7 pg (ref 26.0–34.0)
MCHC: 31.7 g/dL (ref 30.0–36.0)
MCV: 97 fL (ref 78.0–100.0)
Platelets: 257 10*3/uL (ref 150–400)
RBC: 4.3 MIL/uL (ref 3.87–5.11)
RDW: 13.8 % (ref 11.5–15.5)
WBC: 13.3 10*3/uL — ABNORMAL HIGH (ref 4.0–10.5)

## 2018-02-26 LAB — SURGICAL PCR SCREEN
MRSA, PCR: NEGATIVE
Staphylococcus aureus: NEGATIVE

## 2018-02-26 MED ORDER — WHITE PETROLATUM EX OINT
TOPICAL_OINTMENT | CUTANEOUS | Status: AC
  Filled 2018-02-26: qty 28.35

## 2018-02-26 MED ORDER — DEXTROSE IN LACTATED RINGERS 5 % IV SOLN: INTRAVENOUS | Status: DC

## 2018-02-26 MED ORDER — DEXTROSE IN LACTATED RINGERS 5 % IV SOLN
INTRAVENOUS | Status: AC
  Administered 2018-02-26 – 2018-02-28 (×5): via INTRAVENOUS

## 2018-02-26 NOTE — Discharge Summary (Signed)
Family Medicine Teaching [facility] Discharge Summary  Patient name: Barbara Ward Medical record number: [id number] Date of birth: November 10, 1970 Age: 47 y.o. Gender: female Date of Admission: 02/23/2018  Date of Discharge: 03/06/2018 Admitting Physician: Lennox Solders, MD  Primary Care Provider: Patient, No Pcp Per Consultants: GI, General surgery  Indication for Hospitalization: Gastric Outlet Obstruction  Discharge Diagnoses/Problem List:  Gastric Outlet Obstruction Hernia of Mediastinum Prader-Willi Syndrome Hypothyrodism  Disposition: Discharge to Group Home  Discharge Condition: Stable  Discharge Exam:  General: sitting in bedside chair, comfortable and talkative L. Central Line (7/10) Cardiovascular:Normal rate,regular rhythm,normal heart soundsand intact distal pulses.  Pulmonary/Chest:Breath sounds normal.  Abdominal:Normal bowel sounds in 4 quadrants,softto palpation with minimal tenderness to palpation around G-tube. G-tube in upper L quadrant. Skin: Skin iswarmand dry. Capillary refill takesless than 2 seconds. Extremities: bilateral 1+ non-pitting edema  Brief Hospital Course:  Barbara Ward is a 47 year old female with Prader-Willi Syndrome admitted to the hospital 02/23/2018 with severe abdominal pain. Abdominal CT revealed grossly distended stomach secondary to Gastric Outlet Obstruction and diaphragmatic herniation involving the Omentum and Transverse Colon into the anterior mediastinum. Patient was made NPO. NG tube was placed and suctioned 900cc of fluid out of the stomach, relieving the patient's pain. Due to codeine allergy, patient's pain was primarily controlled with IV fentanyl. GI was consulted and performed an EGD, removing another liter of food and fluid content from the stomach. On 7/7, patient became obtunded from frequent doses of fentanyl and was given Narcan 0.2mg  with good response. General Surgery was consulted and performed a laparoscopic  repair of the gastric outlet obstruction and hiatal hernia, as well as Gastrostomy tube placement on 02/27/2018.  She remained n.p.o. and TPN was initiated 02/28/2018. On 7/12, the patient was then started on clear liquid diet per surgery recommendations and diet was slowly advanced.  She is tolerating soft foods without problem and is ready for discharge back to her group home on 7/16.   Issues for Follow Up:  1. Add back blood pressure medicines as needed 2. Continue levothyroxine for hypothyroidism. 3. Lovastatin was discontinued and atorvastatin 40 mg p.o. daily was started for control of hyperlipidemia. 4. Continue using CPAP at night for obstructive sleep apnea. 5. Continue dietary restrictions of 1200 cal daily his risk of stomach distention, and take multivitamins and supplements for additional nourishment. 6. Follow-up with surgery as scheduled.  Significant Procedures:  - EGD with Propofol (02/25/2018, Dr. Levora Angel) - Laparoscopic Repair of Hiatal Hernia and Gastrostomy Tube (02/27/2018)  Significant Labs and Imaging:  CBC Latest Ref Rng & Units 03/01/2018 02/28/2018 02/27/2018  WBC 4.0 - 10.5 K/uL 11.0(H) 13.3(H) 10.7(H)  Hemoglobin 12.0 - 15.0 g/dL 10.6(L) 11.8(L) 11.2(L)  Hematocrit 36.0 - 46.0 % 34.9(L) 37.8 36.5  Platelets 150 - 400 K/uL 188 255 238    CMP Latest Ref Rng & Units 03/04/2018 03/02/2018 03/01/2018  Glucose 70 - 99 mg/dL 782(N) 562(Z) 308(M)  BUN 6 - 20 mg/dL 11 9 6   Creatinine 0.44 - 1.00 mg/dL 5.78 4.69 6.29  Sodium 135 - 145 mmol/L 136 145 147(H)  Potassium 3.5 - 5.1 mmol/L 4.8 3.7 3.5  Chloride 98 - 111 mmol/L 107 111 114(H)  CO2 22 - 32 mmol/L 23 26 27   Calcium 8.9 - 10.3 mg/dL 8.0(L) 7.9(L) 7.9(L)  Total Protein 6.5 - 8.1 g/dL - - 5.0(L)  Total Bilirubin 0.3 - 1.2 mg/dL - - 0.5  Alkaline Phos 38 - 126 U/L - - 42  AST 15 -  41 U/L - - 15  ALT 0 - 44 U/L - - 15   Ct Abdomen Pelvis W Contrast  Result Date: 02/23/2018 CLINICAL DATA:  Mid to lower abdominal pain.  EXAM: CT ABDOMEN AND PELVIS WITH CONTRAST TECHNIQUE: Multidetector CT imaging of the abdomen and pelvis was performed using the standard protocol following bolus administration of intravenous contrast. CONTRAST:  100mL OMNIPAQUE IOHEXOL 300 MG/ML  SOLN COMPARISON:  None. FINDINGS: Lower chest: Herniated colon is identified anterior to the liver and heart. Hepatobiliary: No focal abnormality within the liver parenchyma. There is no evidence for gallstones, gallbladder wall thickening, or pericholecystic fluid. No intrahepatic or extrahepatic biliary dilation. Pancreas: No focal mass lesion. No dilatation of the main duct. No intraparenchymal cyst. No peripancreatic edema. Spleen: No splenomegaly. No focal mass lesion. Adrenals/Urinary Tract: No adrenal nodule or mass. Right kidney unremarkable. 6 mm low-density lesion upper pole left kidney is too small to characterize but likely a cyst. No evidence for hydroureter. The urinary bladder appears normal for the degree of distention. Stomach/Bowel: Stomach is massively distended. There is substantial mass-effect on the pyloric region of the stomach (axial image 21 series 3 and coronal image 48 of series 6. Duodenum is moderately distended. Jejunal loops and ileal loops are nondilated. The terminal ileum is normal. The appendix is not visualized, but there is no edema or inflammation in the region of the cecum. Ascending colon unremarkable. Transverse colon extends up into a hernia involving the anterior mediastinum with nondilated segments of transverse colon identified anterior to the heart. Transverse colon than exits down through the hernia defect along the duodenum and then courses posterior to the stomach. Colon can be identified between the stomach and the portal splenic confluence on image 29 of series 3. Distal colon is decompressed and unremarkable. Vascular/Lymphatic: No abdominal aortic aneurysm. No abdominal aortic atherosclerotic calcification. Portal vein  and superior mesenteric vein are patent. There is no gastrohepatic or hepatoduodenal ligament lymphadenopathy. No intraperitoneal or retroperitoneal lymphadenopathy. No pelvic sidewall lymphadenopathy. Reproductive: The uterus has normal CT imaging appearance. There is no adnexal mass. Other: No intraperitoneal free fluid. Musculoskeletal: No worrisome lytic or sclerotic osseous abnormality. sclerotic focus in the posterior sacrum is likely a bone island. IMPRESSION: Stomach is markedly distended and fluid-filled with evidence of mass-effect on the antral region. This finding is related to herniation of omentum and transverse colon up into the anterior mediastinum, anterior to the heart. The herniated fat and colon generates substantial mass-effect on the distal stomach and proximal duodenum which occupy a high position in the anterior abdomen, in this mass-effect appears to result in a gastric outlet obstruction. There is no evidence of gastric wall thickening, colonic obstruction, or colonic wall thickening. A tiny amount of free fluid is identified in the anterior mediastinal hernia. I discussed these results by telephone at the time of interpretation on 02/23/2018 at 7:57 pm with LESLIE SOFIA, PA . Electronically Signed   By: Kennith CenterEric  Mansell M.D.   On: 02/23/2018 19:59   Dg Chest Portable 1 View  Result Date: 02/23/2018 CLINICAL DATA:  NG tube placement EXAM: PORTABLE CHEST 1 VIEW COMPARISON:  None. FINDINGS: Enteric tube is been placed. The tip is off the field of view but well below the left hemidiaphragm. Shallow inspiration with linear atelectasis in the lung bases. Heart size and pulmonary vascularity are normal. No airspace disease or consolidation in the lungs. No blunting of costophrenic angles. No pneumothorax. Mediastinal contours appear intact. S-shaped thoracolumbar scoliosis. IMPRESSION: Enteric tube tip  is off the field of view but below the left hemidiaphragm. Shallow inspiration with linear  atelectasis in the lung bases. Thoracolumbar scoliosis. Electronically Signed   By: Burman Nieves M.D.   On: 02/23/2018 21:19   Dg Abd Portable 1v  Result Date: 02/24/2018 CLINICAL DATA:  Nasogastric tube placement EXAM: PORTABLE ABDOMEN - 1 VIEW COMPARISON:  CT abdomen and pelvis February 23, 2018 FINDINGS: Nasogastric tube tip and side port in stomach. There is stool in the right colon. There is a paucity of gas overall. There is no bowel dilatation or air-fluid level to suggest bowel obstruction. No evident free air. There is contrast in the urinary bladder. IMPRESSION: Nasogastric tube tip and side port in stomach. Overall paucity of gas. While this finding may be seen normally, it may also be indicative of enteritis or early ileus. Bowel obstruction not felt to be likely. Electronically Signed   By: Bretta Bang III M.D.   On: 02/24/2018 10:17   Results/Tests Pending at Time of Discharge: none  Discharge Medications:  Allergies as of 03/06/2018      Reactions   Codeine Other (See Comments)   Unknown reaction   Penicillins Other (See Comments)   Unknown reaction   Tape Other (See Comments)   "surgical tape" - unknown reaction      Medication List    STOP taking these medications   lovastatin 20 MG tablet Commonly known as:  MEVACOR   ramipril 10 MG capsule Commonly known as:  ALTACE     TAKE these medications   atorvastatin 40 MG tablet Commonly known as:  LIPITOR Take 1 tablet (40 mg total) by mouth daily at 6 PM.   busPIRone 5 MG tablet Commonly known as:  BUSPAR Take 5 mg by mouth 2 (two) times daily.   citalopram 40 MG tablet Commonly known as:  CELEXA Take 40 mg by mouth daily.   docusate sodium 100 MG capsule Commonly known as:  COLACE Take 100 mg by mouth every evening.   FIBER-CAPS PO Take 1 capsule by mouth 2 (two) times daily.   fluticasone 50 MCG/ACT nasal spray Commonly known as:  FLONASE Place 1 spray into both nostrils every evening.    furosemide 40 MG tablet Commonly known as:  LASIX Take 40-80 mg by mouth 2 (two) times daily. Take 2 tabs (80mg ) in the morning, and 1 tab (40mg ) in the evening.   glucosamine-chondroitin 500-400 MG tablet Take 1 tablet by mouth every morning.   levothyroxine 137 MCG tablet Commonly known as:  SYNTHROID, LEVOTHROID Take 137 mcg by mouth daily before breakfast.   loratadine 10 MG tablet Commonly known as:  CLARITIN Take 10 mg by mouth every morning.   multivitamin with minerals Tabs tablet Take 1 tablet by mouth every morning.   potassium chloride 10 MEQ tablet Commonly known as:  K-DUR Take 10 mEq by mouth every morning.            Durable Medical Equipment  (From admission, onward)        Start     Ordered   03/02/18 1019  For home use only DME 4 wheeled rolling walker with seat  Once    Question:  Patient needs a walker to treat with the following condition  Answer:  Gastric outlet obstruction   03/02/18 1019     Discharge Instructions: Please refer to Patient Instructions section of EMR for full details.  Patient was counseled important signs and symptoms that should prompt return to  medical care, changes in medications, dietary instructions, activity restrictions, and follow up appointments.   Follow-Up Appointments: Follow-up Information    Ovidio Kin, MD Follow up.   Specialty:  General Surgery Why:  Office is working on follow-up appointment and will call you with follow-up date and time.  Be at the office 30 minutes early for check-in.  Bring photo ID and insurance information. Contact information: [address] [place] [address] [phone]           Dollene Cleveland, DO 03/06/2018, 2:19 PM PGY-1, [facility]

## 2018-02-26 NOTE — Progress Notes (Signed)
Family Medicine Teaching Service Daily Progress Note Intern Pager: [phone]  Patient name: TEMITOPE FLAMMER Medical record number: [id number] Date of birth: 1971/03/24 Age: 47 y.o. Gender: female  Primary Care Provider: Patient, No Pcp Per Consultants: Gen Surg; GI signed off Code Status: Full  Pt Overview and Major Events to Date:  Admitted to FPTS on 02/23/18 for Gastric Outlet Obstruction  Assessment and Plan: Camerin Jimenez is a 47 year old female with history of Prader-Willi syndrome,mental retardation, sleep apnea, hypothyroid, abdominal hernia, hyperlipidemia, and bilateral lower extremity lymphedema with venous stasis presenting with2daysof severe, generalizedabdominal pain radiating to her back.   Gastric Outlet Obstruction CT scan on admission showing markedly distended stomach withtransverse colon and omentum herniating into anterior mediastinum, causinggastricoutletobstruction. - NPO - NG tube in place  - Surgery consulted, appreciate recs - plan for ex-lap 7/9 - Pain control with Fentanyl38mcg q2hr PRN - monitor pain closely; patients with P-W have high pain thresholds - Hold all other PO meds until after NG tube removed -GI consulted, EGD on 7/7 removed 1L food/fluid from stomach; signed off to Gen-Surg  Unresponsive episode Patient with episode of unresponsiveness following fentanyl dose at 0459. RRT arrived and gave patient narcan. Will plan to decrease pain regimen -decrease fentanyl to q2hrs -continuous pulse ox + end tidal CO2 -monitor closely   Prader-WilliSyndrome Patient hascognition of ~80-45 year-old. PWS comes with an insatiable appetite,however she has been restricted to a 1200 calorie diet and has a BMI in the healthy range.  - Keep patient NPO until after NG tube removed  - Strict diet control at 1,200 calories/day - Multivitamin and Calcium supplementation after PO restriction lifted  Hypothyroidism Home meds: Levothyroxine  qDaily PO before breakfast - IV Levothyroxine on at half dose  Hypertension Stable. BP 135/84. Home meds: Lasix 40mg  PO qDaily(likely also for venous stasis), Ramipril 10mg  capsule 10mg  PO qAM - Hold Lasix and Ramipril until after NG tube removed  HLD  Home meds: d/c Lovastatin 20mg  PO qHS --> Atorvastatin 40mg  PO qDaily - Lipid Panel with elevated LDL 113, otherwise WNL - Start high-intensity Atorvastatin 40mg  PO qDaily after NPO lifted and NG tube removed  Anxiety Well-controlled with medications. Home meds: Buspirone 5mg  tablet PO BID, Citalopram 40mg  tablet PO qDaily - Hold Buspirone and Citalopram until NG tube removed - Can consider IV benzo if patient anxious while NPO  Sleep Apnea Patient uses CPAP at night; however, current NG tube complicates mask placement - d/c CPAP until after NG tube removed  FEN/GI:NPO JYN:WGNF  Disposition: pending clinical improvement  Subjective:  Patient seen this morning lying in bed in no apparent distress. Patient was tired but otherwise arousable and alert. She denies headaches, fevers, chest pain,   Objective: Temp:  [98.2 F (36.8 C)-99.4 F (37.4 C)] 99.1 F (37.3 C) (07/08 0505) Pulse Rate:  [67-78] 67 (07/08 0505) Resp:  [18-21] 18 (07/08 0505) BP: (126-147)/(49-83) 147/81 (07/08 0505) SpO2:  [95 %-99 %] 98 % (07/08 0505) Physical Exam  Constitutional: She appears well-developed and well-nourished.  HENT:  Head: Normocephalic.  NG tube in place, draining bilious fluid  Cardiovascular: Normal rate, regular rhythm and normal heart sounds.  Pulmonary/Chest: Effort normal and breath sounds normal.  Abdominal: Soft. Normal appearance and bowel sounds are normal. There is guarding (2/2 pain with palpation, generalized).  Neurological: She is alert.  Skin: Skin is warm and dry. Capillary refill takes less than 2 seconds.  Psychiatric: She has a normal mood and affect.    Laboratory:  Recent Labs  Lab 02/24/18 0218  02/25/18 0654 02/26/18 0623  WBC 9.7 14.2* 13.3*  HGB 12.3 14.0 13.2  HCT 38.2 45.2 41.7  PLT 308 298 257   Recent Labs  Lab 02/23/18 1609 02/24/18 0218 02/25/18 0654 02/26/18 0623  NA 144 147* 149* 149*  K 3.5 3.0* 4.4 5.2*  CL 103 103 103 105  CO2 31 31 33* 32  BUN 9 8 13 16   CREATININE 0.80 0.75 1.02* 0.99  CALCIUM 9.8 9.3 9.3 8.7*  PROT 7.0 5.9*  --   --   BILITOT 0.8 0.8  --   --   ALKPHOS 61 47  --   --   ALT 14 11  --   --   AST 17 15  --   --   GLUCOSE 157* 95 114* 64*    TSH: 0.885 (02/26/2018)  Imaging/Diagnostic Tests: Ct Abdomen Pelvis W Contrast  Result Date: 02/23/2018 CLINICAL DATA:  Mid to lower abdominal pain. EXAM: CT ABDOMEN AND PELVIS WITH CONTRAST TECHNIQUE: Multidetector CT imaging of the abdomen and pelvis was performed using the standard protocol following bolus administration of intravenous contrast. CONTRAST:  OMNIPAQUE IOHEXOL 300 MG/ML  SOLN COMPARISON:  None. FINDINGS: Lower chest: Herniated colon is identified anterior to the liver and heart. Hepatobiliary: No focal abnormality within the liver parenchyma. There is no evidence for gallstones, gallbladder wall thickening, or pericholecystic fluid. No intrahepatic or extrahepatic biliary dilation. Pancreas: No focal mass lesion. No dilatation of the main duct. No intraparenchymal cyst. No peripancreatic edema. Spleen: No splenomegaly. No focal mass lesion. Adrenals/Urinary Tract: No adrenal nodule or mass. Right kidney unremarkable. 6 mm low-density lesion upper pole left kidney is too small to characterize but likely a cyst. No evidence for hydroureter. The urinary bladder appears normal for the degree of distention. Stomach/Bowel: Stomach is massively distended. There is substantial mass-effect on the pyloric region of the stomach (axial image 21 series 3 and coronal image 48 of series 6. Duodenum is moderately distended. Jejunal loops and ileal loops are nondilated. The terminal ileum is normal. The  appendix is not visualized, but there is no edema or inflammation in the region of the cecum. Ascending colon unremarkable. Transverse colon extends up into a hernia involving the anterior mediastinum with nondilated segments of transverse colon identified anterior to the heart. Transverse colon than exits down through the hernia defect along the duodenum and then courses posterior to the stomach. Colon can be identified between the stomach and the portal splenic confluence on image 29 of series 3. Distal colon is decompressed and unremarkable. Vascular/Lymphatic: No abdominal aortic aneurysm. No abdominal aortic atherosclerotic calcification. Portal vein and superior mesenteric vein are patent. There is no gastrohepatic or hepatoduodenal ligament lymphadenopathy. No intraperitoneal or retroperitoneal lymphadenopathy. No pelvic sidewall lymphadenopathy. Reproductive: The uterus has normal CT imaging appearance. There is no adnexal mass. Other: No intraperitoneal free fluid. Musculoskeletal: No worrisome lytic or sclerotic osseous abnormality. sclerotic focus in the posterior sacrum is likely a bone island. IMPRESSION: Stomach is markedly distended and fluid-filled with evidence of mass-effect on the antral region. This finding is related to herniation of omentum and transverse colon up into the anterior mediastinum, anterior to the heart. The herniated fat and colon generates substantial mass-effect on the distal stomach and proximal duodenum which occupy a high position in the anterior abdomen, in this mass-effect appears to result in a gastric outlet obstruction. There is no evidence of gastric wall thickening, colonic obstruction, or colonic wall thickening.  A tiny amount of free fluid is identified in the anterior mediastinal hernia. I discussed these results by telephone at the time of interpretation on 02/23/2018 at 7:57 pm with LESLIE SOFIA, PA . Electronically Signed   By: Kennith CenterEric  Mansell M.D.   On: 02/23/2018  19:59   Dg Chest Portable 1 View  Result Date: 02/23/2018 CLINICAL DATA:  NG tube placement EXAM: PORTABLE CHEST 1 VIEW COMPARISON:  None. FINDINGS: Enteric tube is been placed. The tip is off the field of view but well below the left hemidiaphragm. Shallow inspiration with linear atelectasis in the lung bases. Heart size and pulmonary vascularity are normal. No airspace disease or consolidation in the lungs. No blunting of costophrenic angles. No pneumothorax. Mediastinal contours appear intact. S-shaped thoracolumbar scoliosis. IMPRESSION: Enteric tube tip is off the field of view but below the left hemidiaphragm. Shallow inspiration with linear atelectasis in the lung bases. Thoracolumbar scoliosis. Electronically Signed   By: Burman NievesWilliam  Stevens M.D.   On: 02/23/2018 21:19   Dg Abd Portable 1v  Result Date: 02/24/2018 CLINICAL DATA:  Nasogastric tube placement EXAM: PORTABLE ABDOMEN - 1 VIEW COMPARISON:  CT abdomen and pelvis February 23, 2018 FINDINGS: Nasogastric tube tip and side port in stomach. There is stool in the right colon. There is a paucity of gas overall. There is no bowel dilatation or air-fluid level to suggest bowel obstruction. No evident free air. There is contrast in the urinary bladder. IMPRESSION: Nasogastric tube tip and side port in stomach. Overall paucity of gas. While this finding may be seen normally, it may also be indicative of enteritis or early ileus. Bowel obstruction not felt to be likely. Electronically Signed   By: Bretta BangWilliam  Woodruff III M.D.   On: 02/24/2018 10:17    Dollene ClevelandAnderson, Toria Monte C, DO 02/26/2018, 9:28 AM PGY-1, [facility] FPTS Intern pager: [phone], text pages welcome

## 2018-02-26 NOTE — Progress Notes (Signed)
[facility] Office:  [phone] General Surgery Progress Note   LOS: 3 days  POD -  1 Day Post-Op  Chief Complaint: Abdominal pain  Assessment and Plan: 1.  Diaphragmatic hernia - with colon and omentum  CT scan 02/23/2018 shows herniation of omentum/transverse colon in anterior mediastinum causing mass effect on antrum  EGD- 02/25/2018 - normal stomach, narrowing prepyloric stomach, ? Secondary to extrinsic compression  I spent 30+ minutes with her parents and sister (who is a Engineer, civil (consulting)nurse) going over the surgical options.  This is an odd case.  Plan laparoscopic exploration with repair of diaphragmatic hernia, reduction of colon, and possible gastrostomy tube tomorrow.  Risks include bleeding, infection, colon/stomach injury, recurrent hernia or obstruction.  She needs to get out of bed and walk.  Will get IS at bedside.  2.  Prader-Willi Syndrome 3.  Hypothyroidism 4.  HTN 5.  Anxiety 6.  Sleep apnea 7.  DVT prophylaxis - no chemoprophylaxis   Active Problems:   Hernia of mediastinum   Prader-Willi syndrome   Hypothyroidism   Gastric outlet obstruction  Subjective:  Wants to eat.  Complaining of pain, but non specific.  The patient is requesting pain meds.  Apparently she was oversedated yesterday.  Parents - Lyda JesterCurtis and Mauri PolePhyllis Rouillard at bedside.  Sister, who is a Engineer, civil (consulting)nurse for hospice, also at bedside..  I went over surgical options.  Objective:   Vitals:   02/25/18 2100 02/26/18 0505  BP: (!) 142/83 (!) 147/81  Pulse: 78 67  Resp:  18  Temp: 99.4 F (37.4 C) 99.1 F (37.3 C)  SpO2: 99% 98%     Intake/Output from previous day:  07/07 0701 - 07/08 0700 In: 2311.4 [I.V.:2111.4; IV Piggyback:200] Out: 1700 [Urine:600; Emesis/NG output:1100]  Intake/Output this shift:  Total I/O In: -  Out: 1 [Urine:1]   Physical Exam:   General: WN WF who is alert and oriented.  Has NGT.   HEENT: Normal. Pupils equal. .   Lungs: Clear.   Abdomen: Epigastric discomfort.   Scar at umbilicus.  Has few BS.   Lab Results:    Recent Labs    02/25/18 0654 02/26/18 0623  WBC 14.2* 13.3*  HGB 14.0 13.2  HCT 45.2 41.7  PLT 298 257    BMET   Recent Labs    02/25/18 0654 02/26/18 0623  NA 149* 149*  K 4.4 5.2*  CL 103 105  CO2 33* 32  GLUCOSE 114* 64*  BUN 13 16  CREATININE 1.02* 0.99  CALCIUM 9.3 8.7*    PT/INR  No results for input(s): LABPROT, INR in the last 72 hours.  ABG  No results for input(s): PHART, HCO3 in the last 72 hours.  Invalid input(s): PCO2, PO2   Studies/Results:  Dg Abd Portable 1v  Result Date: 02/24/2018 CLINICAL DATA:  Nasogastric tube placement EXAM: PORTABLE ABDOMEN - 1 VIEW COMPARISON:  CT abdomen and pelvis February 23, 2018 FINDINGS: Nasogastric tube tip and side port in stomach. There is stool in the right colon. There is a paucity of gas overall. There is no bowel dilatation or air-fluid level to suggest bowel obstruction. No evident free air. There is contrast in the urinary bladder. IMPRESSION: Nasogastric tube tip and side port in stomach. Overall paucity of gas. While this finding may be seen normally, it may also be indicative of enteritis or early ileus. Bowel obstruction not felt to be likely. Electronically Signed   By: Bretta BangWilliam  Woodruff III M.D.   On: 02/24/2018  10:17     Anti-infectives:   Anti-infectives (From admission, onward)   None      Ovidio Kin, MD, FACS Pager: [phone] [facility] Surgery Office: [phone] 02/26/2018

## 2018-02-27 ENCOUNTER — Inpatient Hospital Stay (HOSPITAL_COMMUNITY): Payer: Medicare Other | Admitting: Anesthesiology

## 2018-02-27 ENCOUNTER — Inpatient Hospital Stay (HOSPITAL_COMMUNITY): Payer: Medicare Other

## 2018-02-27 ENCOUNTER — Encounter (HOSPITAL_COMMUNITY)
Admission: EM | Disposition: A | Discharge status: Home Health | Payer: Self-pay | Source: Home / Self Care | Attending: Family Medicine

## 2018-02-27 ENCOUNTER — Encounter (HOSPITAL_COMMUNITY): Payer: Self-pay

## 2018-02-27 HISTORY — PX: HIATAL HERNIA REPAIR: SHX195

## 2018-02-27 HISTORY — PX: LAPAROSCOPIC GASTROSTOMY: SHX5896

## 2018-02-27 LAB — BASIC METABOLIC PANEL
Anion gap: 13 (ref 5–15)
BUN: 8 mg/dL (ref 6–20)
CALCIUM: 8.3 mg/dL — AB (ref 8.9–10.3)
CO2: 24 mmol/L (ref 22–32)
Chloride: 109 mmol/L (ref 98–111)
Creatinine, Ser: 0.71 mg/dL (ref 0.44–1.00)
GFR calc Af Amer: >60 mL/min (ref >60)
GLUCOSE: 117 mg/dL — AB (ref 70–99)
Potassium: 3.7 mmol/L (ref 3.5–5.1)
SODIUM: 146 mmol/L — AB (ref 135–145)

## 2018-02-27 LAB — CBC
HCT: 36.5 % (ref 36.0–46.0)
Hemoglobin: 11.2 g/dL — ABNORMAL LOW (ref 12.0–15.0)
MCH: 30.4 pg (ref 26.0–34.0)
MCHC: 30.7 g/dL (ref 30.0–36.0)
MCV: 99.2 fL (ref 78.0–100.0)
PLATELETS: 238 10*3/uL (ref 150–400)
RBC: 3.68 MIL/uL — AB (ref 3.87–5.11)
RDW: 13.6 % (ref 11.5–15.5)
WBC: 10.7 10*3/uL — ABNORMAL HIGH (ref 4.0–10.5)

## 2018-02-27 SURGERY — REPAIR, HERNIA, HIATAL, LAPAROSCOPIC
Anesthesia: General | Site: Abdomen

## 2018-02-27 MED ORDER — PHENYLEPHRINE 40 MCG/ML (10ML) SYRINGE FOR IV PUSH (FOR BLOOD PRESSURE SUPPORT)
PREFILLED_SYRINGE | INTRAVENOUS | Status: DC | PRN
  Administered 2018-02-27: 120 ug via INTRAVENOUS

## 2018-02-27 MED ORDER — PROPOFOL 10 MG/ML IV BOLUS
INTRAVENOUS | Status: DC | PRN
  Administered 2018-02-27: 120 mg via INTRAVENOUS

## 2018-02-27 MED ORDER — FENTANYL CITRATE (PF) 100 MCG/2ML IJ SOLN: 25.0000 ug | INTRAMUSCULAR | Status: DC | PRN

## 2018-02-27 MED ORDER — LACTATED RINGERS IV SOLN
INTRAVENOUS | Status: DC
  Administered 2018-02-27 (×2): via INTRAVENOUS

## 2018-02-27 MED ORDER — BUPIVACAINE-EPINEPHRINE 0.25% -1:200000 IJ SOLN
INTRAMUSCULAR | Status: DC | PRN
  Administered 2018-02-27: 30 mL

## 2018-02-27 MED ORDER — ROCURONIUM BROMIDE 10 MG/ML (PF) SYRINGE
PREFILLED_SYRINGE | INTRAVENOUS | Status: AC
  Filled 2018-02-27: qty 10

## 2018-02-27 MED ORDER — SODIUM CHLORIDE 0.9 % IR SOLN
Status: DC | PRN
  Administered 2018-02-27: 1000 mL

## 2018-02-27 MED ORDER — LIDOCAINE 2% (20 MG/ML) 5 ML SYRINGE
INTRAMUSCULAR | Status: DC | PRN
  Administered 2018-02-27: 40 mg via INTRAVENOUS

## 2018-02-27 MED ORDER — ONDANSETRON HCL 4 MG/2ML IJ SOLN: 4.0000 mg | Freq: Once | INTRAMUSCULAR | Status: DC | PRN

## 2018-02-27 MED ORDER — BUPIVACAINE-EPINEPHRINE (PF) 0.25% -1:200000 IJ SOLN
INTRAMUSCULAR | Status: AC
  Filled 2018-02-27: qty 60

## 2018-02-27 MED ORDER — FENTANYL CITRATE (PF) 250 MCG/5ML IJ SOLN
INTRAMUSCULAR | Status: DC | PRN
  Administered 2018-02-27 (×3): 50 ug via INTRAVENOUS
  Administered 2018-02-27: 100 ug via INTRAVENOUS

## 2018-02-27 MED ORDER — ENOXAPARIN SODIUM 40 MG/0.4ML ~~LOC~~ SOLN
40.0000 mg | SUBCUTANEOUS | Status: DC
  Administered 2018-02-28 – 2018-03-06 (×7): 40 mg via SUBCUTANEOUS
  Filled 2018-02-27 (×7): qty 0.4

## 2018-02-27 MED ORDER — CEFOTETAN DISODIUM-DEXTROSE 2-2.08 GM-%(50ML) IV SOLR: 2.0000 g | Freq: Once | INTRAVENOUS | Status: DC

## 2018-02-27 MED ORDER — LIDOCAINE 2% (20 MG/ML) 5 ML SYRINGE
INTRAMUSCULAR | Status: AC
  Filled 2018-02-27: qty 5

## 2018-02-27 MED ORDER — PHENYLEPHRINE 40 MCG/ML (10ML) SYRINGE FOR IV PUSH (FOR BLOOD PRESSURE SUPPORT)
PREFILLED_SYRINGE | INTRAVENOUS | Status: AC
  Filled 2018-02-27: qty 10

## 2018-02-27 MED ORDER — LACTATED RINGERS IV SOLN
INTRAVENOUS | Status: DC | PRN
  Administered 2018-02-27: 12:00:00 via INTRAVENOUS

## 2018-02-27 MED ORDER — ROCURONIUM BROMIDE 10 MG/ML (PF) SYRINGE
PREFILLED_SYRINGE | INTRAVENOUS | Status: DC | PRN
  Administered 2018-02-27: 50 mg via INTRAVENOUS
  Administered 2018-02-27: 20 mg via INTRAVENOUS
  Administered 2018-02-27: 30 mg via INTRAVENOUS

## 2018-02-27 MED ORDER — DEXAMETHASONE SODIUM PHOSPHATE 10 MG/ML IJ SOLN
INTRAMUSCULAR | Status: DC | PRN
  Administered 2018-02-27: 10 mg via INTRAVENOUS

## 2018-02-27 MED ORDER — CEFOTETAN DISODIUM-DEXTROSE 2-2.08 GM-%(50ML) IV SOLR
2.0000 g | Freq: Once | INTRAVENOUS | Status: DC
Start: 2018-02-27 — End: 2018-02-27
  Administered 2018-02-27: 2 g via INTRAVENOUS
  Filled 2018-02-27: qty 50

## 2018-02-27 MED ORDER — SODIUM CHLORIDE 0.9 % IV SOLN
2.0000 g | INTRAVENOUS | Status: DC
  Filled 2018-02-27: qty 2

## 2018-02-27 MED ORDER — FENTANYL CITRATE (PF) 250 MCG/5ML IJ SOLN
INTRAMUSCULAR | Status: AC
  Filled 2018-02-27: qty 5

## 2018-02-27 MED ORDER — SUCCINYLCHOLINE CHLORIDE 200 MG/10ML IV SOSY
PREFILLED_SYRINGE | INTRAVENOUS | Status: AC
  Filled 2018-02-27: qty 10

## 2018-02-27 MED ORDER — SODIUM CHLORIDE 0.9 % IV SOLN
Freq: Once | INTRAVENOUS | Status: DC
  Filled 2018-02-27: qty 100

## 2018-02-27 MED ORDER — 0.9 % SODIUM CHLORIDE (POUR BTL) OPTIME
TOPICAL | Status: DC | PRN
  Administered 2018-02-27 (×2): 1000 mL

## 2018-02-27 MED ORDER — SUGAMMADEX SODIUM 200 MG/2ML IV SOLN
INTRAVENOUS | Status: DC | PRN
  Administered 2018-02-27: 200 mg via INTRAVENOUS

## 2018-02-27 MED ORDER — BUPIVACAINE LIPOSOME 1.3 % IJ SUSP
INTRAMUSCULAR | Status: DC | PRN
  Administered 2018-02-27: 20 mL

## 2018-02-27 MED ORDER — FAMOTIDINE IN NACL 20-0.9 MG/50ML-% IV SOLN
20.0000 mg | Freq: Two times a day (BID) | INTRAVENOUS | Status: DC
  Administered 2018-02-27 – 2018-02-28 (×2): 20 mg via INTRAVENOUS
  Filled 2018-02-27 (×2): qty 50

## 2018-02-27 MED ORDER — SUCCINYLCHOLINE CHLORIDE 200 MG/10ML IV SOSY
PREFILLED_SYRINGE | INTRAVENOUS | Status: DC | PRN
  Administered 2018-02-27: 100 mg via INTRAVENOUS

## 2018-02-27 MED ORDER — MIDAZOLAM HCL 2 MG/2ML IJ SOLN
INTRAMUSCULAR | Status: DC | PRN
  Administered 2018-02-27: 2 mg via INTRAVENOUS

## 2018-02-27 MED ORDER — ONDANSETRON HCL 4 MG/2ML IJ SOLN
INTRAMUSCULAR | Status: DC | PRN
  Administered 2018-02-27: 4 mg via INTRAVENOUS

## 2018-02-27 MED ORDER — MIDAZOLAM HCL 2 MG/2ML IJ SOLN
INTRAMUSCULAR | Status: AC
  Filled 2018-02-27: qty 2

## 2018-02-27 SURGICAL SUPPLY — 85 items
ADAPTER CATH SYR TO TUBING 38M (ADAPTER) ×3 IMPLANT
APPLIER CLIP ROT 10 11.4 M/L (STAPLE)
BENZOIN TINCTURE PRP APPL 2/3 (GAUZE/BANDAGES/DRESSINGS) ×3 IMPLANT
BLADE CLIPPER SURG (BLADE) IMPLANT
CANISTER SUCT 3000ML PPV (MISCELLANEOUS) ×3 IMPLANT
CATH MUSHROOM 20FR (CATHETERS) IMPLANT
CATH MUSHROOM 22FR (CATHETERS) IMPLANT
CHLORAPREP W/TINT 26ML (MISCELLANEOUS) ×3 IMPLANT
CLAMP ENDO BABCK 10MM (STAPLE) ×3 IMPLANT
CLIP APPLIE ROT 10 11.4 M/L (STAPLE) IMPLANT
COVER SURGICAL LIGHT HANDLE (MISCELLANEOUS) ×3 IMPLANT
DECANTER SPIKE VIAL GLASS SM (MISCELLANEOUS) ×3 IMPLANT
DERMABOND ADVANCED (GAUZE/BANDAGES/DRESSINGS) ×2
DERMABOND ADVANCED .7 DNX12 (GAUZE/BANDAGES/DRESSINGS) ×1 IMPLANT
DEVICE SUTURE ENDOST 10MM (ENDOMECHANICALS) ×3 IMPLANT
DEVICE TROCAR PUNCTURE CLOSURE (ENDOMECHANICALS) ×3 IMPLANT
DISSECTOR BLUNT TIP ENDO 5MM (MISCELLANEOUS) IMPLANT
DRAIN PENROSE 1/2X36 STERILE (WOUND CARE) ×3 IMPLANT
DRAPE HALF SHEET 40X57 (DRAPES) ×3 IMPLANT
DRAPE LAPAROSCOPIC ABDOMINAL (DRAPES) ×6 IMPLANT
DRAPE LAPAROTOMY T 102X78X121 (DRAPES) ×3 IMPLANT
DRAPE UTILITY XL STRL (DRAPES) ×6 IMPLANT
ELECT REM PT RETURN 9FT ADLT (ELECTROSURGICAL) ×3
ELECTRODE REM PT RTRN 9FT ADLT (ELECTROSURGICAL) ×1 IMPLANT
G TUBE REPLACEMENT RT ANGLE 24 (Tubing) ×3 IMPLANT
GAUZE SPONGE 4X4 12PLY STRL (GAUZE/BANDAGES/DRESSINGS) ×3 IMPLANT
GAUZE SPONGE 4X4 12PLY STRL LF (GAUZE/BANDAGES/DRESSINGS) ×3 IMPLANT
GLOVE BIO SURGEON STRL SZ 6.5 (GLOVE) ×2 IMPLANT
GLOVE BIO SURGEONS STRL SZ 6.5 (GLOVE) ×1
GLOVE BIOGEL M 8.0 STRL (GLOVE) ×3 IMPLANT
GLOVE BIOGEL PI IND STRL 7.0 (GLOVE) ×2 IMPLANT
GLOVE BIOGEL PI INDICATOR 7.0 (GLOVE) ×4
GLOVE ECLIPSE 6.0 STRL STRAW (GLOVE) ×3 IMPLANT
GLOVE SURG SIGNA 7.5 PF LTX (GLOVE) ×6 IMPLANT
GLOVE SURG SS PI 6.5 STRL IVOR (GLOVE) ×3 IMPLANT
GOWN STRL REUS W/ TWL LRG LVL3 (GOWN DISPOSABLE) ×2 IMPLANT
GOWN STRL REUS W/ TWL XL LVL3 (GOWN DISPOSABLE) ×1 IMPLANT
GOWN STRL REUS W/TWL LRG LVL3 (GOWN DISPOSABLE) ×4
GOWN STRL REUS W/TWL XL LVL3 (GOWN DISPOSABLE) ×2
KIT BASIN OR (CUSTOM PROCEDURE TRAY) ×3 IMPLANT
KIT TURNOVER KIT B (KITS) ×3 IMPLANT
NEEDLE HYPO 25GX1X1/2 BEV (NEEDLE) ×3 IMPLANT
NS IRRIG 1000ML POUR BTL (IV SOLUTION) ×3 IMPLANT
PACK GENERAL/GYN (CUSTOM PROCEDURE TRAY) ×3 IMPLANT
PAD ARMBOARD 7.5X6 YLW CONV (MISCELLANEOUS) ×6 IMPLANT
PENCIL BUTTON HOLSTER BLD 10FT (ELECTRODE) IMPLANT
PLUG CATH AND CAP STER (CATHETERS) ×3 IMPLANT
SCISSORS LAP 5X35 DISP (ENDOMECHANICALS) ×3 IMPLANT
SET IRRIG TUBING LAPAROSCOPIC (IRRIGATION / IRRIGATOR) ×3 IMPLANT
SHEARS HARMONIC ACE PLUS 36CM (ENDOMECHANICALS) ×3 IMPLANT
SLEEVE ADV FIXATION 5X100MM (TROCAR) IMPLANT
SLEEVE ENDOPATH XCEL 5M (ENDOMECHANICALS) ×3 IMPLANT
SPONGE DRAIN TRACH 4X4 STRL 2S (GAUZE/BANDAGES/DRESSINGS) ×3 IMPLANT
SPONGE LAP 18X18 X RAY DECT (DISPOSABLE) ×3 IMPLANT
STAPLER VISISTAT 35W (STAPLE) ×3 IMPLANT
SUT ETHILON 2 0 FS 18 (SUTURE) ×3 IMPLANT
SUT MNCRL AB 4-0 PS2 18 (SUTURE) ×3 IMPLANT
SUT NOVA 1 T20/GS 25DT (SUTURE) IMPLANT
SUT NOVA NAB DX-16 0-1 5-0 T12 (SUTURE) ×3 IMPLANT
SUT PDS AB 1 CT  36 (SUTURE) ×8
SUT PDS AB 1 CT 36 (SUTURE) ×4 IMPLANT
SUT SURGIDAC NAB ES-9 0 48 120 (SUTURE) IMPLANT
SUT VIC AB 1 CT1 27 (SUTURE) ×2
SUT VIC AB 1 CT1 27XBRD ANBCTR (SUTURE) ×1 IMPLANT
SUT VIC AB 2-0 SH 18 (SUTURE) IMPLANT
SUT VIC AB 2-0 SH 27 (SUTURE) ×2
SUT VIC AB 2-0 SH 27X BRD (SUTURE) ×1 IMPLANT
SYR CONTROL 10ML LL (SYRINGE) IMPLANT
SYRINGE TOOMEY DISP (SYRINGE) ×3 IMPLANT
TIP INNERVISION DETACH 40FR (MISCELLANEOUS) IMPLANT
TIP INNERVISION DETACH 50FR (MISCELLANEOUS) IMPLANT
TIP INNERVISION DETACH 56FR (MISCELLANEOUS) IMPLANT
TIPS INNERVISION DETACH 40FR (MISCELLANEOUS)
TOWEL OR 17X24 6PK STRL BLUE (TOWEL DISPOSABLE) ×3 IMPLANT
TOWEL OR 17X26 10 PK STRL BLUE (TOWEL DISPOSABLE) ×3 IMPLANT
TRAY FOLEY MTR SLVR 14FR STAT (SET/KITS/TRAYS/PACK) ×3 IMPLANT
TRAY LAPAROSCOPIC MC (CUSTOM PROCEDURE TRAY) ×3 IMPLANT
TROCAR ADV FIXATION 5X100MM (TROCAR) IMPLANT
TROCAR BLADELESS 11MM (ENDOMECHANICALS) ×3 IMPLANT
TROCAR XCEL BLUNT TIP 100MML (ENDOMECHANICALS) IMPLANT
TROCAR XCEL NON-BLD 11X100MML (ENDOMECHANICALS) IMPLANT
TROCAR XCEL NON-BLD 5MMX100MML (ENDOMECHANICALS) IMPLANT
TUBING INSUF HEATED (TUBING) ×3 IMPLANT
TUBING INSUFFLATION (TUBING) ×6 IMPLANT
WATER STERILE IRR 1000ML POUR (IV SOLUTION) ×3 IMPLANT

## 2018-02-27 NOTE — Progress Notes (Signed)
Family Medicine Teaching Service Daily Progress Note Intern Pager: [phone]  Patient name: Barbara Ward Medical record number: [id number] Date of birth: April 23, 1971 Age: 47 y.o. Gender: female  Primary Care Provider: Patient, No Pcp Per Consultants: Gen Surg; GI signed off Code Status: Full  Pt Overview and Major Events to Date:  Admitted to FPTS on 02/23/18 for Gastric Outlet Obstruction  Assessment and Plan: Barbara Oddiammy Frazee is a 47 year old female with history of Prader-Willi syndrome,mental retardation, sleep apnea, hypothyroid, abdominal hernia, hyperlipidemia, and bilateral lower extremity lymphedema with venous stasis presenting withsevere, generalizedabdominal pain radiating to her back.  Gastric Outlet Obstruction CT scanon admission showingmarkedly distended stomach withtransverse colon and omentum herniating into anterior mediastinum, causinggastricoutletobstruction. -NPO with NG tube in place  - will follow surgery's instructions for removing NG tube and advancing diet - Surgery consulted, appreciate recs - plan for ex-lap 7/9 - Pain control with Fentanyl10725mcg q2hr PRN - monitor pain closely; patients with P-W have high pain thresholds - Hold all other PO meds until after NG tube removed -GI consulted, EGD on 7/7 removed 1L food/fluid from stomach; signed off to Gen-Surg  Unresponsive episode, resolved Patient with episode of unresponsiveness following fentanyl dose at 0459. RRT arrived and gave patient narcan. Pain regimen has since been decreased. -decrease fentanyl to 25mcg q2hrs -d/c continuous pulse ox + end tidal CO2 -monitor closely  Prader-WilliSyndrome, chronic Patient hascognition of ~587-45 year-old.PWS comes with an insatiable appetite,however she has been restricted to a 1200 calorie diet and has a BMI in the healthy range.  - Keep patient NPO until after NG tube removed  - Strict diet control at 1,200 calories/day - Multivitamin and Calcium  supplementation after PO restriction lifted  Hypothyroidism, chronic Home meds:Levothyroxine 137mcg qDaily PO before breakfast - IV Levothyroxine on at half dose  Hypertension Stable. BP 110/70. Home meds:Lasix 40mg  PO qDaily(likely also for venous stasis),Ramipril 10mg  capsule 10mg  PO qAM - Hold Lasix and Ramipril until after NG tube removed  HLD Home meds:d/c Lovastatin 20mg  PO qHS --> Atorvastatin 40mg  PO qDaily - Lipid Panel with elevated LDL 113, otherwise WNL - Start high-intensity Atorvastatin 40mg  PO qDaily after NPO lifted and NG tube removed  Anxiety Well-controlled with medications. Home meds:Buspirone 5mg  tablet PO BID,Citalopram 40mg  tablet PO qDaily - Hold Buspirone and Citalopram until NG tube removed - Can consider IV benzo if patient anxious while NPO  Sleep Apnea Patient uses CPAP at night; however, current NG tube complicates mask placement - d/c CPAP until after NG tube removed  FEN/GI:NPO ZDG:UYQIPPx:SCDs  Disposition: pending clinical improvement  Subjective: Patient seen this morning sitting upright in bed working in activity book, surrounded by family and friends. She denies headaches, fevers, shortness of breath, nausea, vomiting, diarrhea, and chest pain. She reports mild intermittent abdominal pain well-controlled with fentanyl.   The patient became teary in she is nervous about surgery, but continues to keep her spirits up. She is scheduled for laporoscopic repair of diaphragmatic hernia this morning.   Objective: Vitals:   02/26/18 2215 02/27/18 0645  BP: 124/68 110/70  Pulse: 77 (!) 58  Resp: 17 18  Temp: 99 F (37.2 C) 98 F (36.7 C)  SpO2:  98%     Physical Exam  Constitutional: She appears well-developed and well-nourished.  Non-toxic appearance. She does not appear ill.  HENT:  Head: Atraumatic.  Eyes: EOM are normal.  Cardiovascular: Normal rate, regular rhythm, normal heart sounds and intact distal pulses.   Pulmonary/Chest: Effort normal and breath sounds normal.  Abdominal: Normal appearance and bowel sounds are normal. She exhibits no distension and no mass. There is no tenderness (with palpation, minor.).  Neurological: She is alert.  Skin: Skin is warm and dry. Capillary refill takes less than 2 seconds.  Psychiatric: She has a normal mood and affect.    Laboratory: CBC CBC Latest Ref Rng & Units 02/27/2018 02/26/2018 02/25/2018  WBC 4.0 - 10.5 K/uL 10.7(H) 13.3(H) 14.2(H)  Hemoglobin 12.0 - 15.0 g/dL 11.2(L) 13.2 14.0  Hematocrit 36.0 - 46.0 % 36.5 41.7 45.2  Platelets 150 - 400 K/uL 238 257 298   BMP Latest Ref Rng & Units 02/27/2018 02/26/2018 02/25/2018  Glucose 70 - 99 mg/dL 161(W) 96(E) 454(U)  BUN 6 - 20 mg/dL 8 16 13   Creatinine 0.44 - 1.00 mg/dL 9.81 1.91 4.78(G)  Sodium 135 - 145 mmol/L 146(H) 149(H) 149(H)  Potassium 3.5 - 5.1 mmol/L 3.7 5.2(H) 4.4  Chloride 98 - 111 mmol/L 109 105 103  CO2 22 - 32 mmol/L 24 32 33(H)  Calcium 8.9 - 10.3 mg/dL 8.3(L) 8.7(L) 9.3   Imaging/Diagnostic Tests:  CT Abdomen and Pelvis w/ Contrast: 02/23/2018 IMPRESSION: Stomach is markedly distended and fluid-filled with evidence of mass-effect on the antral region. This finding is related to herniation of omentum and transverse colon up into the anterior mediastinum, anterior to the heart. The herniated fat and colon generates substantial mass-effect on the distal stomach and proximal duodenum which occupy a high position in the anterior abdomen, in this mass-effect appears to result in a gastric outlet obstruction. There is no evidence of gastric wall thickening, colonic obstruction, or colonic wall thickening. A tiny amount of free fluid is identified in the anterior mediastinal hernia.   Dg Chest Portable 1 View: 02/23/2018 IMPRESSION: Enteric tube tip is off the field of view but below the left hemidiaphragm. Shallow inspiration with linear atelectasis in the lung bases. Thoracolumbar scoliosis.  Dg Abd  Portable 1v: 02/24/2018 IMPRESSION: Nasogastric tube tip and side port in stomach. Overall paucity of gas. While this finding may be seen normally, it may also be indicative of enteritis or early ileus. Bowel obstruction not felt to be likely.   Dollene Cleveland, DO 02/27/2018, 6:45 AM PGY-1, [facility] FPTS Intern pager: [phone], text pages welcome

## 2018-02-27 NOTE — OR Nursing (Signed)
Gtube connected to drainage bag and taped to pt per MD instructions.

## 2018-02-27 NOTE — Transfer of Care (Signed)
Immediate Anesthesia Transfer of Care Note  Patient: Barbara Ward  Procedure(s) Performed: LAPAROSCOPIC REPAIR OF HIATAL HERNIA (N/A Abdomen) LAPAROSCOPIC GASTROSTOMY (N/A Abdomen)  Patient Location: PACU  Anesthesia Type:General  Level of Consciousness: awake and drowsy  Airway & Oxygen Therapy: Patient Spontanous Breathing and Patient connected to nasal cannula oxygen  Post-op Assessment: Report given to RN and Post -op Vital signs reviewed and stable  Post vital signs: Reviewed and stable  Last Vitals:  Vitals Value Taken Time  BP 126/51 02/27/2018  2:10 PM  Temp    Pulse 93 02/27/2018  2:15 PM  Resp 13 02/27/2018  2:15 PM  SpO2 93 % 02/27/2018  2:15 PM  Vitals shown include unvalidated device data.  Last Pain:  Vitals:   02/27/18 0645  TempSrc: Oral  PainSc:          Complications: No apparent anesthesia complications

## 2018-02-27 NOTE — Anesthesia Postprocedure Evaluation (Signed)
Anesthesia Post Note  Patient: Barbara Ward  Procedure(s) Performed: LAPAROSCOPIC REPAIR OF HIATAL HERNIA (N/A Abdomen) LAPAROSCOPIC GASTROSTOMY (N/A Abdomen)     Patient location during evaluation: PACU Anesthesia Type: General Level of consciousness: awake and alert Pain management: pain level controlled Vital Signs Assessment: post-procedure vital signs reviewed and stable Respiratory status: spontaneous breathing, nonlabored ventilation, respiratory function stable and patient connected to nasal cannula oxygen Cardiovascular status: blood pressure returned to baseline and stable Postop Assessment: no apparent nausea or vomiting Anesthetic complications: no    Last Vitals:  Vitals:   02/27/18 1526 02/27/18 1740  BP: 116/66 113/70  Pulse: 81 (!) 112  Resp: 16 16  Temp: 36.6 C 37.4 C  SpO2: 97% 100%    Last Pain:  Vitals:   02/27/18 1828  TempSrc:   PainSc: 10-Worst pain ever                 Hana Trippett COKER

## 2018-02-27 NOTE — Progress Notes (Signed)
Mother and sister at bedside.  She is ready for surgery.  Answered questions.  Plan laparoscopic exploration with repair of diaphragmatic hernia, reduction of colon, and possible gastrostomy tube   Ovidio Kinavid Shateria Paternostro, MD, [facility] Pager: [phone] Office phone:  [phone]

## 2018-02-27 NOTE — Anesthesia Preprocedure Evaluation (Addendum)
Anesthesia Evaluation  Patient identified by MRN, date of birth, ID band Patient awake    Reviewed: Allergy & Precautions, NPO status , Patient's Chart, lab work & pertinent test results  Airway Mallampati: II  TM Distance: >3 FB Neck ROM: Full    Dental  (+) Poor Dentition, Missing, Chipped, Loose, Dental Advisory Given   Pulmonary    breath sounds clear to auscultation       Cardiovascular  Rhythm:Regular Rate:Normal     Neuro/Psych    GI/Hepatic   Endo/Other    Renal/GU      Musculoskeletal   Abdominal   Peds  Hematology   Anesthesia Other Findings   Reproductive/Obstetrics                            Anesthesia Physical Anesthesia Plan  ASA: III  Anesthesia Plan: General   Post-op Pain Management:    Induction:   PONV Risk Score and Plan: Ondansetron and Dexamethasone  Airway Management Planned: Oral ETT  Additional Equipment:   Intra-op Plan:   Post-operative Plan: Possible Post-op intubation/ventilation  Informed Consent: I have reviewed the patients History and Physical, chart, labs and discussed the procedure including the risks, benefits and alternatives for the proposed anesthesia with the patient or authorized representative who has indicated his/her understanding and acceptance.   Dental advisory given  Plan Discussed with: CRNA and Anesthesiologist  Anesthesia Plan Comments:         Anesthesia Quick Evaluation

## 2018-02-27 NOTE — Anesthesia Procedure Notes (Signed)
Procedure Name: Intubation Date/Time: 02/27/2018 11:28 AM Performed by: Izola Priceockfield, Ashe Gago Walton Jr., CRNA Pre-anesthesia Checklist: Patient identified, Emergency Drugs available, Suction available and Patient being monitored Patient Re-evaluated:Patient Re-evaluated prior to induction Oxygen Delivery Method: Circle system utilized Preoxygenation: Pre-oxygenation with 100% oxygen Induction Type: IV induction, Cricoid Pressure applied and Rapid sequence Laryngoscope Size: Glidescope and 3 Grade View: Grade I Tube type: Oral Tube size: 7.0 mm Number of attempts: 1 Airway Equipment and Method: Stylet and Video-laryngoscopy Placement Confirmation: ETT inserted through vocal cords under direct vision,  positive ETCO2 and breath sounds checked- equal and bilateral Secured at: 21 cm Tube secured with: Tape Dental Injury: Teeth and Oropharynx as per pre-operative assessment

## 2018-02-27 NOTE — Op Note (Signed)
02/23/2018 - 02/27/2018  1:41 PM  PATIENT:  Barbara Ward, 47 y.o., female, MRN: [id number]  PREOP DIAGNOSIS:  Diaphragmatic hernia  POSTOP DIAGNOSIS:   Anterior diaphragmatic hernia with colon and omentum incarcerated in the hernia, adhesion of omentum to falciform ligament, large atonic stomach [photos at the end of this note]  PROCEDURE:   Procedure(s): LAPAROSCOPIC REPAIR OF Anterior diaphragmatic hernia, LAPAROSCOPIC GASTROSTOMY (#24 tube)  SURGEON:   Ovidio Kinavid Marke Goodwyn, M.D.  ASSISTANT:   Bailey MechLiz Simaan, PA  ANESTHESIA:   general  Anesthesiologist: Kipp BroodJoslin, Jourdin Connors, MD CRNA: Lanell MatarBaker, Kathryn M, CRNA; Nils PyleBell, Sarah T, CRNA; Cockfield, Gwendolyn LimaFlynn Walton Jr., CRNA  General  EBL:  minima  ml  BLOOD ADMINISTERED: none  DRAINS: none   LOCAL MEDICATIONS USED:   20 cc of exparel and 30 cc of 1/4% marcaine  SPECIMEN:   none  COUNTS CORRECT:  YES  INDICATIONS FOR PROCEDURE:  Barbara Singhami L Galster is a 47 y.o. (DOB: 1971-01-18) white female whose primary care physician is Patient, No Pcp Per and comes for laparoscopic exploration of gastric outlet obstruction caused by colon/omentum caught in an anterior diaphragmatic hernia.   The indications and risks of the surgery were explained to the patient.  The risks include, but are not limited to, infection, bleeding, and nerve injury.  PROCEDURE:   [facility] She underwent a general anesthetic. She had a right internal jugular catheter placed. She had a Foley placed. Her abdomen was prepped with ChloraPrep and sterilely draped.   A timeout was held and surgical checklist run.   I accessed the left upper quadrant with a 5 mm Ethicon Optiview trocar. Unfortunately, I entered the stomach with the trocar placement.  Stomach was not as well to decompress his I'm adjuvant. I was able to withdraw the trocar and into the peritoneal cavity. I placed 3 additional trochars:  A 5 mm trocar in the left paramedian location, a 5 mm trocar  in the right paramedian location, and a 5 mm trocar in the right upper quadrant.   I carried out an abdominal expiration. The stomach was massively dilated. We replaced the NG tube and were able to partially decompress the stomach.  I then identified the hole that I made the stomach with a 5 mm trocar. I closed this hole with 2 2-0 Vicryl sutures.   The liver was unremarkable. The gallbladder was unremarkable. The colon was draped over the left lobe of the liver through the anterior diaphragmatic hiatal hernia. I was able to reduce this: Without much difficulty. There was a band between the transverse colon and the falciform ligament. With the colon in the diaphragmatic hernia, this band acted as a vice on the antrum and caused the partial gastric outlet obstruction.  I divided the band with a harmonic scalpel. This dropped the transverse colon into the mid abdomen.   I closed the anterior diaphragmatic hernia. It was approximately 8 cm transversely and about 3 cm from front to back.  I closed the fascial defect with 3 #1 Novafil sutures through a trans-abdominal approach using the Endoclose to drive the suture through both sides of the hernia defect.   I did not try to remove the hernia sac in that it was large, went into the anterior chest, and there appeared to be little benefit in trying to remove this.   I then placed a gastrostomy tube laparoscopically.  Using the Endo Close I placed 3 #1 PDS sutures in  a triangle fashion in the anterior wall the stomach and pulled these through the anterior abdomen.  I then made a pursestring with a #1 Vicryl suture.  I made the hole in the anterior abdominal wall with an 11 mm Ethicon trocar.   I created a hole in the stomach and placed a #24 angled gastrostomy tube into the stomach without difficulty. The gastrostomy tube had a 6 mL balloon and I put 6 mL of water in the balloon. I then tied the Vicryl suture down around the gastrostomy tube. I cinched down the  sutures to the anterior abdominal wall.   I then irrigated the abdomen with a liter of saline. I spilled a small amount of gastric contents in the abdominal cavity upon my initial injury, but this was minimal.   I did a bilateral tap block using quarter percent Marcaine mixed with Exparel. I then removed the trochars in turn. I sewed the gastrostomy tube with a 2-0 nylon suture.   I closed the trocars sites with a 4-0 monocryl suture.  And painted the wounds with Dermabond.   The pateint was transferred to the PACU in good condition.   Impression of transverse colon/omentum which caused obstruction of pylorus   Adhesive band between the transverse colon and falciform ligament   Gastrostomy tube placement   Repair of anterior diaphragmatic hernia    Anterior diaphragmatic hernia     Ovidio Kin, MD, [facility] Pager: [phone] Office phone:  [phone]

## 2018-02-27 NOTE — Care Management Important Message (Signed)
Important Message  Patient Details  Name: Barbara Ward L Sherrard MRN: [id number] Date of Birth: 10/18/1970   Medicare Important Message Given:  Yes    Tyanna Hach Stefan ChurchBratton 02/27/2018, 2:17 PM

## 2018-02-27 NOTE — Anesthesia Procedure Notes (Signed)
Central Venous Catheter Insertion Performed by: Kipp BroodJoslin, Taliana Mersereau, MD, anesthesiologist Start/End7/04/2018 10:55 AM, 02/27/2018 11:05 AM Patient location: OR. Preanesthetic checklist: patient identified, IV checked, site marked, risks and benefits discussed, surgical consent, monitors and equipment checked, pre-op evaluation, timeout performed and anesthesia consent Lidocaine 1% used for infiltration and patient sedated Hand hygiene performed  and maximum sterile barriers used  Catheter size: 8 Fr Total catheter length 16. Central line was placed.Double lumen Procedure performed using ultrasound guided technique. Ultrasound Notes:image(s) printed for medical record Attempts: 1 Following insertion, dressing applied and line sutured. Post procedure assessment: blood return through all ports  Patient tolerated the procedure well with no immediate complications.

## 2018-02-28 LAB — BASIC METABOLIC PANEL
Anion gap: 8 (ref 5–15)
BUN: 6 mg/dL (ref 6–20)
CALCIUM: 8 mg/dL — AB (ref 8.9–10.3)
CO2: 27 mmol/L (ref 22–32)
Chloride: 110 mmol/L (ref 98–111)
Creatinine, Ser: 0.77 mg/dL (ref 0.44–1.00)
GFR calc Af Amer: >60 mL/min (ref >60)
GLUCOSE: 140 mg/dL — AB (ref 70–99)
Potassium: 4.3 mmol/L (ref 3.5–5.1)
Sodium: 145 mmol/L (ref 135–145)

## 2018-02-28 LAB — CBC WITH DIFFERENTIAL/PLATELET
Abs Immature Granulocytes: 0.1 10*3/uL (ref 0.0–0.1)
BASOS PCT: 0 %
Basophils Absolute: 0 10*3/uL (ref 0.0–0.1)
EOS PCT: 0 %
Eosinophils Absolute: 0 10*3/uL (ref 0.0–0.7)
HEMATOCRIT: 37.8 % (ref 36.0–46.0)
Hemoglobin: 11.8 g/dL — ABNORMAL LOW (ref 12.0–15.0)
Immature Granulocytes: 1 %
Lymphocytes Relative: 5 %
Lymphs Abs: 0.6 10*3/uL — ABNORMAL LOW (ref 0.7–4.0)
MCH: 30.6 pg (ref 26.0–34.0)
MCHC: 31.2 g/dL (ref 30.0–36.0)
MCV: 97.9 fL (ref 78.0–100.0)
MONO ABS: 1.1 10*3/uL — AB (ref 0.1–1.0)
MONOS PCT: 8 %
Neutro Abs: 11.5 10*3/uL — ABNORMAL HIGH (ref 1.7–7.7)
Neutrophils Relative %: 86 %
PLATELETS: 255 10*3/uL (ref 150–400)
RBC: 3.86 MIL/uL — ABNORMAL LOW (ref 3.87–5.11)
RDW: 13.3 % (ref 11.5–15.5)
WBC: 13.3 10*3/uL — ABNORMAL HIGH (ref 4.0–10.5)

## 2018-02-28 LAB — GLUCOSE, CAPILLARY: GLUCOSE-CAPILLARY: 89 mg/dL (ref 70–99)

## 2018-02-28 MED ORDER — DEXTROSE IN LACTATED RINGERS 5 % IV SOLN
INTRAVENOUS | Status: DC
  Administered 2018-03-01 – 2018-03-04 (×6): via INTRAVENOUS

## 2018-02-28 MED ORDER — TRAVASOL 10 % IV SOLN
INTRAVENOUS | Status: AC
  Administered 2018-02-28: 18:00:00 via INTRAVENOUS
  Filled 2018-02-28: qty 324

## 2018-02-28 MED ORDER — SODIUM CHLORIDE 0.9% FLUSH
10.0000 mL | INTRAVENOUS | Status: DC | PRN
  Administered 2018-02-28 (×3): 10 mL
  Administered 2018-03-01: 20 mL
  Filled 2018-02-28 (×3): qty 40

## 2018-02-28 MED ORDER — SODIUM CHLORIDE 0.9% FLUSH: 10.0000 mL | INTRAVENOUS | Status: DC | PRN

## 2018-02-28 MED ORDER — INSULIN ASPART 100 UNIT/ML ~~LOC~~ SOLN
0.0000 [IU] | Freq: Four times a day (QID) | SUBCUTANEOUS | Status: DC
  Administered 2018-03-02 (×2): 1 [IU] via SUBCUTANEOUS

## 2018-02-28 NOTE — Social Work (Signed)
CSW attempted to call Barbara Ward with group home, no response, voicemail full. Pt family was speaking with her this morning, she is aware pt will return at discharge.  CSW will try again as time allows to reach group home.   Doy HutchingIsabel H Kasper Ward, LCSWA [facility] Clinical Social Work [phone]

## 2018-02-28 NOTE — Clinical Social Work Note (Signed)
Clinical Social Work Assessment  Patient Details  Name: Barbara Ward MRN: [id number] Date of Birth: 02/23/1971  Date of referral:  02/28/18               Reason for consult:  Discharge Planning                Permission sought to share information with:  Facility Contact Representative, Family Supports Permission granted to share information::  Yes, Verbal Permission Granted  Name::     Curtis and Phyllis Anton  Agency::  [phone]-[facility]. Group Home Manager.   Relationship::  mother and father  Contact Information:  [phone]  Housing/Transportation Living arrangements for the past 2 months:  Group Home Source of Information:  Patient, Parent Patient Interpreter Needed:  None Criminal Activity/Legal Involvement Pertinent to Current Situation/Hospitalization:  No - Comment as needed Significant Relationships:  Community Support, Other Family Members, Parents, Friend Lives with:  Facility Resident Do you feel safe going back to the place where you live?  Yes Need for family participation in patient care:  Yes (Comment)  Care giving concerns:  Pt from group home. Has I/DD at baseline, has supportive caregivers and family.    Social Worker assessment / plan:  CSW met with pt at bedside. Pt mother and father present and supportive. Pt states she is feeling better, and walked in the hallway this morning. Pt states she lives at a group home with four other folks and has her own room. She also has a one on one support in addition to the group home caregivers. Pt family state that they feel she is well cared for and are pleased with how she is doing post surgery.   Pt feels that she is improving and would like to return to her home when she is ready. CSW given the number for April [facility], group home manager to coordinate when pt will be ready to return.   Employment status:  Disabled (Comment on whether or not currently receiving Disability) Insurance information:  Medicare,  Medicaid In State PT Recommendations:  Not assessed at this time Information / Referral to community resources:  Other (Comment Required)(Group Home)  Patient/Family's Response to care:  Despite documented I/DD, pt provided appropriate answers to CSW questions, validated by her parents. Pt and pt parents are aware of pt needs, CSW visit, and coordination of pt return to group home.   Patient/Family's Understanding of and Emotional Response to Diagnosis, Current Treatment, and Prognosis:  Pt and pt family state understanding of diagnosis, current treatment and prognosis. Pt appears happy with care and expresses positivity regarding her ambulation in halls. Pt and pt family are in agreement for pt to return to group home in [place] at discharge.   Emotional Assessment Appearance:  Appears stated age Attitude/Demeanor/Rapport:  Engaged, Gracious Affect (typically observed):  Appropriate, Hopeful, Pleasant Orientation:  Oriented to Self, Oriented to Place, Oriented to Situation(has I/DD) Alcohol / Substance use:  Not Applicable Psych involvement (Current and /or in the community):  Outpatient Provider  Discharge Needs  Concerns to be addressed:  Care Coordination Readmission within the last 30 days:  No Current discharge risk:  Cognitively Impaired, Physical Impairment Barriers to Discharge:  Continued Medical Work up    H , LCSWA 02/28/2018, 12:25 PM  

## 2018-02-28 NOTE — Progress Notes (Addendum)
[facility] Progress Note  1 Day Post-Op  Subjective: CC-  Patient has already been up this morning ambulating in the halls. Pain well controlled. Denies n/v. States that she's hungry.  Objective: Vital signs in last 24 hours: Temp:  [97.6 F (36.4 C)-99.3 F (37.4 C)] 97.9 F (36.6 C) (07/10 0624) Pulse Rate:  [75-112] 89 (07/10 0624) Resp:  [14-17] 17 (07/10 0624) BP: (113-132)/(65-87) 132/83 (07/10 0624) SpO2:  [92 %-100 %] 98 % (07/10 0624) Last BM Date: 02/23/18  Intake/Output from previous day: 07/09 0701 - 07/10 0700 In: 3130 [I.V.:3100] Out: 680 [Urine:365; Blood:15] Intake/Output this shift: Total I/O In: 10 [I.V.:10] Out: -   PE: Gen:  Alert, NAD, pleasant HEENT: EOM's intact, pupils equal and round Pulm:  CTAB, no W/R/R, effort normal Abd: Soft, ND, appropriately tender, lap incisions cdi, +BS, G-tube site cdi with trace bloody drainage on dressing/bag to gravity Skin: no rashes noted, warm and dry  Lab Results:  Recent Labs    02/27/18 0651 02/28/18 0347  WBC 10.7* 13.3*  HGB 11.2* 11.8*  HCT 36.5 37.8  PLT 238 255   BMET Recent Labs    02/27/18 0651 02/28/18 0347  NA 146* 145  K 3.7 4.3  CL 109 110  CO2 24 27  GLUCOSE 117* 140*  BUN 8 6  CREATININE 0.71 0.77  CALCIUM 8.3* 8.0*   PT/INR No results for input(s): LABPROT, INR in the last 72 hours. CMP     Component Value Date/Time   NA 145 02/28/2018 0347   K 4.3 02/28/2018 0347   CL 110 02/28/2018 0347   CO2 27 02/28/2018 0347   GLUCOSE 140 (H) 02/28/2018 0347   BUN 6 02/28/2018 0347   CREATININE 0.77 02/28/2018 0347   CALCIUM 8.0 (L) 02/28/2018 0347   PROT 5.9 (L) 02/24/2018 0218   ALBUMIN 3.3 (L) 02/24/2018 0218   AST 15 02/24/2018 0218   ALT 11 02/24/2018 0218   ALKPHOS 47 02/24/2018 0218   BILITOT 0.8 02/24/2018 0218   GFRNONAA >60 02/28/2018 0347   GFRAA >60 02/28/2018 0347   Lipase     Component Value Date/Time   LIPASE 26 02/23/2018 1609        Studies/Results: Dg Chest Port 1 View  Result Date: 02/27/2018 CLINICAL DATA:  Placement of left-sided central line. EXAM: PORTABLE CHEST 1 VIEW COMPARISON:  Portable chest x-ray of February 23, 2018 FINDINGS: The lungs are hypoinflated. There is no pneumothorax nor significant pleural effusion. There is hazy increased density at both lung bases as well as some linear increased density peripherally in the left mid upper lung. The heart is normal in size. The pulmonary vascularity is mildly prominent. The left internal jugular venous catheter tip projects at the junction of the middle and distal thirds of the SVC. IMPRESSION: No postprocedure complication following left internal jugular venous catheter placement. Bilateral hypoinflation. Bibasilar subsegmental atelectasis. Electronically Signed   By: Demetra Moya  [place] M.D.   On: 02/27/2018 14:45    Anti-infectives: Anti-infectives (From admission, onward)   Start     Dose/Rate Route Frequency Ordered Stop   02/27/18 1200  cefoTEtan in Dextrose 5% (CEFOTAN) IVPB 2 g  Status:  Discontinued     2 g 100 mL/hr over 30 Minutes Intravenous  Once 02/27/18 1148 02/27/18 1230   02/27/18 1200  cefoTEtan in Dextrose 5% (CEFOTAN) IVPB 2 g  Status:  Discontinued     2 g 100 mL/hr over 30 Minutes Intravenous  Once 02/27/18 1152 02/27/18  1155   02/27/18 1200  cefoTEtan in Dextrose 5% (CEFOTAN) IVPB 2 g  Status:  Discontinued     2 g 100 mL/hr over 30 Minutes Intravenous  Once 02/27/18 1154 02/27/18 1156   02/27/18 1200  cefoTEtan (CEFOTAN) 2 g in sodium chloride 0.9 % 100 mL IVPB  Status:  Discontinued     2 g 200 mL/hr over 30 Minutes Intravenous To Surgery 02/27/18 1154 02/27/18 1523   02/27/18 1200  sodium chloride 0.9 % 100 mL with cefoTEtan (CEFOTAN) 2 g infusion  Status:  Discontinued     100 mL/hr  Intravenous  Once 02/27/18 1155 02/27/18 1156       Assessment/Plan Prader-Willi Syndrome Hypothyroidism HTN HLD Anxiety Sleep apnea  Gastric  outlet obstruction Diaphragmatic hernia S/p laparoscopic repair of anterior diaphragmatic hernia, gastrostomy tube 7/9 Dr. Ezzard Standing - POD 1 - G-tube to gravity, flush q8 hours  ID - cefotetan perioperative FEN - TPN, NPO VTE - SCDs, lovenox Foley - d/c today  Plan - D/c foley. Continue ambulating. Consult PT/OT.   Start TPN to improve nutritional status as patient has been NPO for several days and she will be NPO for at least a couple days postop.   LOS: 5 days    Franne Forts , [facility] 02/28/2018, 9:23 AM Pager: [phone] Consults: [phone] Mon 7:00 am -11:30 AM  Agree with above. She has done well so far. Agree on TPN for nutrition.  The big question will be how her stomach responds.  It was massively dilated at surgery and I expect has been chronically obstructed for some time. Will go very slowly feeding her.  Her uncle is in the room.  Ovidio Kin, MD, [facility] Pager: [phone] Office phone:  [phone]

## 2018-02-28 NOTE — Plan of Care (Signed)
  Problem: Education: Goal: Knowledge of General Education information will improve Outcome: Progressing Note:  POC and pain management reviewed with pt./mother.   

## 2018-02-28 NOTE — Progress Notes (Signed)
Initial Nutrition Assessment  DOCUMENTATION CODES:   Morbid obesity  INTERVENTION:   -TPN management per pharmacy -RD will follow for diet advancement and supplement as appropriate  NUTRITION DIAGNOSIS:   Inadequate oral intake related to altered GI function as evidenced by NPO status.  GOAL:   Patient will meet greater than or equal to 90% of their needs  MONITOR:   Diet advancement, Labs, Weight trends, Skin, I & O's  REASON FOR ASSESSMENT:   Consult New TPN/TNA  ASSESSMENT:   Barbara Ward is a 47 y.o. female presenting with severe abdominal pain 2/2 diaphragmatic and anterior mediastinal herniation. PMH is significant for pedal edema 2/2 venous insufficiency and Prader-Willi Syndrome.  Pt admitted with GOO.  7/5- CT scan revealed dilated stomach withtransverse colon and omentum herniating through the diaphragm into the anterior mediastinum suggesting that this was coming across the distal stomach/duodenum creating the obstruction; NGT placed for decompression 7/7- s/p EGD- revealed large amount of residue in stomach as well as narrowing of prepyloric stomach as well as pyloric channel and duodenal bulb 7/9- s/p lap repair of anterior diaphragmatic hernia, gastrostomy placement   Spoke with pt, caregiver, and multiple family members at bedside. All reports that pt had a great appetite PTA. She resides in a group home and her intake is carefully monitored. Per pt caregiver, pt consumes 3 meals and 3 snacks, which total to about 1200 kcals daily. Staff members use measuring cups to ensure proper portions. Snacks are usually low calorie (carrots/celery with ranch dressing), but will occasionally snack on peanut butter crackers or popcorn. Per pt, she tries to limit fluid intake to 64 oz per day to assist with leg edema. She is typically very mobile, but uses a walker for assistance. Pt is very eager to walk today.   Per pt mother, pt has maintained a wt of around 175# for the  past several years following her diet plan.   Pt complains of hunger. Discussed with pt and family how pt will receive her nutrition.   PICC line has been placed. Per pharmacy note, plan to initiate TPN at 25 ml/hr, which will provide 626 kcals and 32 grams of protein, meeting 50% of estimated kcal needs and 40% of estimated protein needs.  Labs reviewed.   NUTRITION - FOCUSED PHYSICAL EXAM:    Most Recent Value  Orbital Region  No depletion  Upper Arm Region  No depletion  Thoracic and Lumbar Region  No depletion  Buccal Region  No depletion  Temple Region  No depletion  Clavicle Bone Region  No depletion  Clavicle and Acromion Bone Region  No depletion  Scapular Bone Region  No depletion  Dorsal Hand  No depletion  Patellar Region  No depletion  Anterior Thigh Region  No depletion  Posterior Calf Region  No depletion  Edema (RD Assessment)  Mild  Hair  Reviewed  Eyes  Reviewed  Mouth  Reviewed  Skin  Reviewed  Nails  Reviewed       Diet Order:   Diet Order           Diet NPO time specified  Diet effective now          EDUCATION NEEDS:   Education needs have been addressed  Skin:  Skin Assessment: Skin Integrity Issues: Skin Integrity Issues:: Incisions Incisions: closed abdomen  Last BM:  02/23/18  Height:   Ht Readings from Last 1 Encounters:  02/28/18 4\' 6"  (1.372 m)    Weight:  Wt Readings from Last 1 Encounters:  02/28/18 175 lb (79.4 kg)    Ideal Body Weight:  40.9 kg  BMI:  Body mass index is 42.19 kg/m.  Estimated Nutritional Needs:   Kcal:  1250-1450  Protein:  80-95 grams  Fluid:  > 1.2 L    Aashrith Eves A. Mayford KnifeWilliams, RD, LDN, CDE Pager: [phone] After hours Pager: [phone]

## 2018-02-28 NOTE — Progress Notes (Signed)
Family Medicine Teaching Service Daily Progress Note Intern Pager: [phone]  Patient name: Barbara Ward Medical record number: [id number] Date of birth: 24-Mar-1971 Age: 47 y.o. Gender: female  Primary Care Provider: Patient, No Pcp Per Consultants:Gen Surg; GI signed off Code Status:Full  Pt Overview and Major Events to Date: Admitted to FPTS on 7/5/19for Gastric Outlet Obstruction  Assessment and Plan: Barbara Ward is a 47 year old female with history of Prader-Willi syndrome,mental retardation, sleep apnea, hypothyroid, abdominal hernia, hyperlipidemia, and bilateral lower extremity lymphedema with venous stasis presenting withsevere, generalizedabdominal pain radiating to her back.  Gastric Outlet Obstruction CT scanon admission showingmarkedly distended stomach withtransverse colon and omentum herniating into anterior mediastinum, causinggastricoutletobstruction. -NPO - will follow surgery's instructions for advancing diet - NG tube removed (7/9) - Surgery consulted, appreciate recs- Lap hernia repair performed (7/9) - Pain control with Fentanyl28mcg q2hr PRN - monitor pain closely; patients with P-W have high pain thresholds - Hold all other PO meds -GI consulted,EGD on 7/7 removed 1L food/fluid from stomach; signed off to Gen-Surg  Unresponsive episode, resolved Patient with episode of unresponsiveness following fentanyl dose at 0459. RRT arrived and gave patient narcan. Pain regimen has since been decreased. -decrease fentanyl to q2hrs -d/c continuous pulse ox + end tidal CO2 -monitor closely  Prader-WilliSyndrome, chronic Patient hascognition of ~69-45 year-old.PWS comes with an insatiable appetite,however she has been restricted to a 1200 calorie diet and has a BMI in the healthy range.  - NG tube removed, patient remains NPO - Strict diet control at 1,200 calories/day - Multivitamin and Calcium supplementation after PO restriction  lifted  Hypothyroidism, chronic Home meds:Levothyroxine qDaily PO before breakfast -IV Levothyroxine on at half dose  Hypertension Stable. BP 110/70. Home meds:Lasix 40mg  PO qDaily(likely also for venous stasis),Ramipril 10mg  capsule 10mg  PO qAM - Hold Lasix and Ramipril PO restriction lifted  HLD Home meds:d/cLovastatin 20mg  PO qHS--> Atorvastatin 40mg  PO qDaily -Lipid Panel with elevated LDL 113, otherwise WNL - Start high-intensity Atorvastatin 40mg  PO qDaily after NPO lifted  Anxiety Well-controlled with medications. Home meds:Buspirone 5mg  tablet PO BID,Citalopram 40mg  tablet PO qDaily - Hold Buspirone and Citalopram until NG tube removed - Can consider IV benzo if patient anxious while NPO  Sleep Apnea Patient uses CPAP at night; however, current NG tube complicates mask placement - may restart CPAP  FEN/GI:NPO JYN:WGNF  Disposition:pending clinical improvement  Subjective: Patient seen this morning lying in bed, family is in the room. she is day 1 s/p laparoscopc repair of Anterior Diaphragmatic Hernia with Laparoscopic Gastrostomy. She denies headaches, fevers, shortness of breath, chills, nausea, vomiting, diarrhea, and chest pain. She reports mild intermittent abdominal pain well-controlled with fentanyl. Patient was preparing to walk the hall with a nurse.  Objective: Temp:  [97.6 F - 99.3] 97.9 F (36.6 C) (07/10 0624) Pulse Rate:  [58-112] 89 (07/10 0624) Resp:  [14-18] 17 (07/10 0624) BP: (110-132)/(65-87) 132/83 (07/10 0624) SpO2:  [92 %-100 %] 98 % (07/10 6213)  Physical Exam  Constitutional: She is oriented to person, place, and time. She appears well-developed and well-nourished.  HENT:  Head: Atraumatic.  NG tube removed (7/9) R. Central Line (7/10)  Eyes: EOM are normal.  Cardiovascular: Normal rate, regular rhythm, normal heart sounds and intact distal pulses.  Pulmonary/Chest: Breath sounds normal.  Abdominal:  Soft. Normal appearance and bowel sounds are normal. She exhibits no distension, no abdominal bruit and no ascites. There is generalized tenderness. There is guarding (2/2 tenderness). There is no rigidity and no rebound.  Wrapped in bind, G-tube in upper L quadrant with minimal drainage to bag.   Neurological: She is alert and oriented to person, place, and time.  Skin: Skin is warm and dry. Capillary refill takes less than 2 seconds.  Psychiatric: She has a normal mood and affect. Her behavior is normal.    Laboratory: Recent Labs  Lab 02/26/18 0623 02/27/18 0651 02/28/18 0347  WBC 13.3* 10.7* 13.3*  HGB 13.2 11.2* 11.8*  HCT 41.7 36.5 37.8  PLT 257 238 255    Imaging/Diagnostic Tests: Ct Abdomen Pelvis W Contrast  Result Date: 02/23/2018 CLINICAL DATA:  Mid to lower abdominal pain. EXAM: CT ABDOMEN AND PELVIS WITH CONTRAST TECHNIQUE: Multidetector CT imaging of the abdomen and pelvis was performed using the standard protocol following bolus administration of intravenous contrast. CONTRAST:  OMNIPAQUE IOHEXOL 300 MG/ML  SOLN COMPARISON:  None. FINDINGS: Lower chest: Herniated colon is identified anterior to the liver and heart. Hepatobiliary: No focal abnormality within the liver parenchyma. There is no evidence for gallstones, gallbladder wall thickening, or pericholecystic fluid. No intrahepatic or extrahepatic biliary dilation. Pancreas: No focal mass lesion. No dilatation of the main duct. No intraparenchymal cyst. No peripancreatic edema. Spleen: No splenomegaly. No focal mass lesion. Adrenals/Urinary Tract: No adrenal nodule or mass. Right kidney unremarkable. 6 mm low-density lesion upper pole left kidney is too small to characterize but likely a cyst. No evidence for hydroureter. The urinary bladder appears normal for the degree of distention. Stomach/Bowel: Stomach is massively distended. There is substantial mass-effect on the pyloric region of the stomach (axial image 21  series 3 and coronal image 48 of series 6. Duodenum is moderately distended. Jejunal loops and ileal loops are nondilated. The terminal ileum is normal. The appendix is not visualized, but there is no edema or inflammation in the region of the cecum. Ascending colon unremarkable. Transverse colon extends up into a hernia involving the anterior mediastinum with nondilated segments of transverse colon identified anterior to the heart. Transverse colon than exits down through the hernia defect along the duodenum and then courses posterior to the stomach. Colon can be identified between the stomach and the portal splenic confluence on image 29 of series 3. Distal colon is decompressed and unremarkable. Vascular/Lymphatic: No abdominal aortic aneurysm. No abdominal aortic atherosclerotic calcification. Portal vein and superior mesenteric vein are patent. There is no gastrohepatic or hepatoduodenal ligament lymphadenopathy. No intraperitoneal or retroperitoneal lymphadenopathy. No pelvic sidewall lymphadenopathy. Reproductive: The uterus has normal CT imaging appearance. There is no adnexal mass. Other: No intraperitoneal free fluid. Musculoskeletal: No worrisome lytic or sclerotic osseous abnormality. sclerotic focus in the posterior sacrum is likely a bone island. IMPRESSION: Stomach is markedly distended and fluid-filled with evidence of mass-effect on the antral region. This finding is related to herniation of omentum and transverse colon up into the anterior mediastinum, anterior to the heart. The herniated fat and colon generates substantial mass-effect on the distal stomach and proximal duodenum which occupy a high position in the anterior abdomen, in this mass-effect appears to result in a gastric outlet obstruction. There is no evidence of gastric wall thickening, colonic obstruction, or colonic wall thickening. A tiny amount of free fluid is identified in the anterior mediastinal hernia. I discussed these  results by telephone at the time of interpretation on 02/23/2018 at 7:57 pm with LESLIE SOFIA, PA . Electronically Signed   By: Kennith Center M.D.   On: 02/23/2018 19:59   Dg Chest Port 1 View  Result Date: 02/27/2018  CLINICAL DATA:  Placement of left-sided central line. EXAM: PORTABLE CHEST 1 VIEW COMPARISON:  Portable chest x-ray of February 23, 2018 FINDINGS: The lungs are hypoinflated. There is no pneumothorax nor significant pleural effusion. There is hazy increased density at both lung bases as well as some linear increased density peripherally in the left mid upper lung. The heart is normal in size. The pulmonary vascularity is mildly prominent. The left internal jugular venous catheter tip projects at the junction of the middle and distal thirds of the SVC. IMPRESSION: No postprocedure complication following left internal jugular venous catheter placement. Bilateral hypoinflation. Bibasilar subsegmental atelectasis. Electronically Signed   By: David  [place] M.D.   On: 02/27/2018 14:45   Dg Chest Portable 1 View  Result Date: 02/23/2018 CLINICAL DATA:  NG tube placement EXAM: PORTABLE CHEST 1 VIEW COMPARISON:  None. FINDINGS: Enteric tube is been placed. The tip is off the field of view but well below the left hemidiaphragm. Shallow inspiration with linear atelectasis in the lung bases. Heart size and pulmonary vascularity are normal. No airspace disease or consolidation in the lungs. No blunting of costophrenic angles. No pneumothorax. Mediastinal contours appear intact. S-shaped thoracolumbar scoliosis. IMPRESSION: Enteric tube tip is off the field of view but below the left hemidiaphragm. Shallow inspiration with linear atelectasis in the lung bases. Thoracolumbar scoliosis. Electronically Signed   By: Burman NievesWilliam  Stevens M.D.   On: 02/23/2018 21:19   Dg Abd Portable 1v  Result Date: 02/24/2018 CLINICAL DATA:  Nasogastric tube placement EXAM: PORTABLE ABDOMEN - 1 VIEW COMPARISON:  CT abdomen and pelvis February 23, 2018 FINDINGS: Nasogastric tube tip and side port in stomach. There is stool in the right colon. There is a paucity of gas overall. There is no bowel dilatation or air-fluid level to suggest bowel obstruction. No evident free air. There is contrast in the urinary bladder. IMPRESSION: Nasogastric tube tip and side port in stomach. Overall paucity of gas. While this finding may be seen normally, it may also be indicative of enteritis or early ileus. Bowel obstruction not felt to be likely. Electronically Signed   By: Bretta BangWilliam  Woodruff III M.D.   On: 02/24/2018 10:17    Dollene ClevelandAnderson, Atheena Spano C, DO 02/28/2018, 6:28 AM PGY-1, [facility] FPTS Intern pager: [phone], text pages welcome

## 2018-02-28 NOTE — Evaluation (Signed)
Physical Therapy Evaluation Patient Details Name: Barbara Ward MRN: [id number] DOB: March 21, 1971 Today's Date: 02/28/2018   History of Present Illness  Pt is a 47 y/o female admitted secondary to incresaed abdominal pain. Found to have hernia of mediastinum. PT is s/p EGD on 7/7 and s/p hernia repair on 7/9. PMH includes cognitive deficits, HTN, Prader Willi syndrome, and OSA on CPAP.  Clinical Impression  Pt admitted secondary to problem above with deficits below. Pt requiring min guard to mod A for functional mobility this session. Noted increased difficulty with basic sit<>Stand transfer secondary to soreness. REquired min guard to min A for mobility using rollator. Per RN, pt from group home, and plan is to return. Recommend new rollator as pt's brakes do not work on current rollator. Will continue to follow acutely to maximize functional mobility independence and safety.     Follow Up Recommendations Home health PT;Supervision for mobility/OOB    Equipment Recommendations  Other (comment)(rollator )    Recommendations for Other Services       Precautions / Restrictions Precautions Precautions: Fall Restrictions Weight Bearing Restrictions: No      Mobility  Bed Mobility               General bed mobility comments: In chair upon entry.   Transfers Overall transfer level: Needs assistance Equipment used: 4-wheeled walker Transfers: Sit to/from Stand Sit to Stand: Mod assist         General transfer comment: Light mod A for lift assist. Increased time required secondary to soreness in abdomen. Required manual blocking of rollator as brakes were broken.   Ambulation/Gait Ambulation/Gait assistance: Min guard;Min assist Gait Distance (Feet): 175 Feet Assistive device: 4-wheeled walker Gait Pattern/deviations: Step-through pattern;Decreased stride length;Trunk flexed Gait velocity: Decreased    General Gait Details: Slow, guarded gait. LOB with standing rest when  taking 1 UE of rollator; required min A for steadying. Otherwise required min guard A for safety. Pt weaving with rollator, however, did not run into obstacles in hall.   Stairs            Wheelchair Mobility    Modified Rankin (Stroke Patients Only)       Balance Overall balance assessment: Needs assistance Sitting-balance support: No upper extremity supported;Feet supported Sitting balance-[name]: Good     Standing balance support: Bilateral upper extremity supported;During functional activity Standing balance-[name]: Poor Standing balance comment: Heavily reliant on UE support.                              Pertinent Vitals/Pain Pain Assessment: Faces Faces Pain Scale: Hurts even more Pain Location: abdominal incision  Pain Descriptors / Indicators: Sore Pain Intervention(s): Limited activity within patient's tolerance;Monitored during session;Repositioned    Home Living Family/patient expects to be discharged to:: Group home                 Additional Comments: Has rollator, however, brakes do not work.     Prior Function Level of Independence: Independent with assistive device(s)         Comments: Used rollator for ambulation, however, reports rollator is broken.      Hand Dominance        Extremity/Trunk Assessment   Upper Extremity Assessment Upper Extremity Assessment: Defer to OT evaluation    Lower Extremity Assessment Lower Extremity Assessment: Generalized weakness    Cervical / Trunk Assessment Cervical / Trunk Assessment: Normal  Communication  Communication: Expressive difficulties  Cognition Arousal/Alertness: Awake/alert Behavior During Therapy: WFL for tasks assessed/performed Overall Cognitive Status: History of cognitive impairments - at baseline                                        General Comments      Exercises     Assessment/Plan    PT Assessment Patient needs  continued PT services  PT Problem List Decreased strength;Decreased activity tolerance;Decreased balance;Decreased mobility;Decreased knowledge of use of DME;Decreased knowledge of precautions;Decreased cognition;Pain       PT Treatment Interventions Gait training;DME instruction;Functional mobility training;Therapeutic activities;Therapeutic exercise;Balance training;Patient/family education    PT Goals (Current goals can be found in the Care Plan section)  Acute Rehab PT Goals Patient Stated Goal: to walk  PT Goal Formulation: With patient Time For Goal Achievement: 03/14/18 Potential to Achieve Goals: Good    Frequency Min 3X/week   Barriers to discharge        Co-evaluation               AM-PAC PT "6 Clicks" Daily Activity  Outcome Measure Difficulty turning over in bed (including adjusting bedclothes, sheets and blankets)?: A Lot Difficulty moving from lying on back to sitting on the side of the bed? : Unable Difficulty sitting down on and standing up from a chair with arms (e.g., wheelchair, bedside commode, etc,.)?: Unable Help needed moving to and from a bed to chair (including a wheelchair)?: A Little Help needed walking in hospital room?: A Little Help needed climbing 3-5 steps with a railing? : A Lot 6 Click Score: 12    End of Session Equipment Utilized During Treatment: Gait belt Activity Tolerance: Patient tolerated treatment well Patient left: in chair;with call bell/phone within reach;with family/visitor present Nurse Communication: Mobility status PT Visit Diagnosis: Other abnormalities of gait and mobility (R26.89);Unsteadiness on feet (R26.81);Muscle weakness (generalized) (M62.81);Pain Pain - part of body: (abdomen )    Time: 6962-9528 PT Time Calculation (min) (ACUTE ONLY): 26 min   Charges:   PT Evaluation $PT Eval Low Complexity: 1 Low PT Treatments $Gait Training: 8-22 mins   PT G Codes:        Gladys Damme, PT, DPT  Acute  Rehabilitation Services  Pager: [phone]   Lehman Prom 02/28/2018, 4:34 PM

## 2018-02-28 NOTE — Progress Notes (Signed)
PHARMACY - ADULT TOTAL PARENTERAL NUTRITION CONSULT NOTE   Pharmacy Consult:  TPN Indication:  GOO  Patient Measurements: Height: 4\' 6"  (137.2 cm) Weight: 175 lb (79.4 kg) IBW/kg (Calculated) : 31.7 TPN AdjBW (KG): 43.6 Body mass index is 42.19 kg/m.  Assessment:  [age] YOF with Prader-Willi syndrome presented on 02/23/18 with abdominal pain.  CT showed markedly distended stomach causing GOO.  EGD negative for internal lesion.  Patient underwent repair of diaphragmatic hernia and G-tube placement on 02/27/18.  Patient has been NPO for 6 days, will continue to be NPO for the next couple of days, and G-tube is only for draining as of now per Surgery.  Pharmacy consulted to start TPN.    Patient reports that she has been maintaining her weight around 175 lbs.  She used to follow-up with an RD and her diet is consistently at 1200 kCal per day (3 meals and 3 snacks).  She also has edema and her fluid is restricted to 64 oz per day.  GI: add Pepcid to TPN, LBM 7/5 Endo: hypothyroid on Synthroid, TSH WNL.  No hx DM - AM glucose < 180 Insulin requirements in the past 24 hours: N/A Lytes: Na down to WNL, others WNL (CoCa low normal) Renal: SCr down 0.77, BUN WNL - D5LR at 100 ml/hr Pulm: Gilliam >> RA Cards: HTN - VSS Hepatobil: LFTs / tbili / TG WNL Neuro: Prader-Willi syndrome.  Pain score 4-6, PRN Fentanyl ID: afebrile, WBC up 13.3 post-op - not on abx TPN Access: CVC double lumen placed 02/27/18 TPN start date: 02/27/18  Nutritional Goals (RD rec pending): 1150-1300 kCal and 55-75gm protein per day   Current Nutrition:  NPO   Plan:  Initiate TPN at 25 ml/hr (goal rate ~50 ml/hr) TPN will provide 32g AA, 102g CHO and 15g ILE for a total of 626 kCal, meeting ~50% of patient's needs Electrolytes in TPN: standard except low K and no Na, Cl:Ac 1:2 (b/c no sodium) Daily multivitamin and trace elements in TPN D/C Pepcid and add 40mg  to TPN Start sensitive SSI Q6H Reduce IVF (D5LR) to 75 ml/hr when  TPN starts Standard TPN labs and nursing care orders  F/U updated height and weight   Cleavon Goldman D. Laney Potashang, PharmD, BCPS, BCCCP 02/28/2018, 10:28 AM

## 2018-03-01 DIAGNOSIS — J9859 Other diseases of mediastinum, not elsewhere classified: Secondary | ICD-10-CM

## 2018-03-01 DIAGNOSIS — Q871 Congenital malformation syndromes predominantly associated with short stature: Secondary | ICD-10-CM

## 2018-03-01 LAB — PREALBUMIN: PREALBUMIN: 11.2 mg/dL — AB (ref 18–38)

## 2018-03-01 LAB — DIFFERENTIAL
Abs Immature Granulocytes: 0.1 10*3/uL (ref 0.0–0.1)
Basophils Absolute: 0.1 10*3/uL (ref 0.0–0.1)
Basophils Relative: 1 %
EOS ABS: 0.7 10*3/uL (ref 0.0–0.7)
EOS PCT: 6 %
Immature Granulocytes: 1 %
Lymphocytes Relative: 11 %
Lymphs Abs: 1.3 10*3/uL (ref 0.7–4.0)
MONO ABS: 1.2 10*3/uL — AB (ref 0.1–1.0)
MONOS PCT: 11 %
Neutro Abs: 7.8 10*3/uL — ABNORMAL HIGH (ref 1.7–7.7)
Neutrophils Relative %: 70 %

## 2018-03-01 LAB — COMPREHENSIVE METABOLIC PANEL
ALT: 15 U/L (ref 0–44)
AST: 15 U/L (ref 15–41)
Albumin: 2.3 g/dL — ABNORMAL LOW (ref 3.5–5.0)
Alkaline Phosphatase: 42 U/L (ref 38–126)
Anion gap: 6 (ref 5–15)
BILIRUBIN TOTAL: 0.5 mg/dL (ref 0.3–1.2)
BUN: 6 mg/dL (ref 6–20)
CO2: 27 mmol/L (ref 22–32)
CREATININE: 0.64 mg/dL (ref 0.44–1.00)
Calcium: 7.9 mg/dL — ABNORMAL LOW (ref 8.9–10.3)
Chloride: 114 mmol/L — ABNORMAL HIGH (ref 98–111)
Glucose, Bld: 117 mg/dL — ABNORMAL HIGH (ref 70–99)
Potassium: 3.5 mmol/L (ref 3.5–5.1)
Sodium: 147 mmol/L — ABNORMAL HIGH (ref 135–145)
TOTAL PROTEIN: 5 g/dL — AB (ref 6.5–8.1)

## 2018-03-01 LAB — TRIGLYCERIDES: Triglycerides: 94 mg/dL (ref <150)

## 2018-03-01 LAB — CBC
HCT: 34.9 % — ABNORMAL LOW (ref 36.0–46.0)
Hemoglobin: 10.6 g/dL — ABNORMAL LOW (ref 12.0–15.0)
MCH: 30.6 pg (ref 26.0–34.0)
MCHC: 30.4 g/dL (ref 30.0–36.0)
MCV: 100.9 fL — ABNORMAL HIGH (ref 78.0–100.0)
PLATELETS: 188 10*3/uL (ref 150–400)
RBC: 3.46 MIL/uL — ABNORMAL LOW (ref 3.87–5.11)
RDW: 13.2 % (ref 11.5–15.5)
WBC: 11 10*3/uL — AB (ref 4.0–10.5)

## 2018-03-01 LAB — GLUCOSE, CAPILLARY
GLUCOSE-CAPILLARY: 108 mg/dL — AB (ref 70–99)
Glucose-Capillary: 104 mg/dL — ABNORMAL HIGH (ref 70–99)
Glucose-Capillary: 115 mg/dL — ABNORMAL HIGH (ref 70–99)
Glucose-Capillary: 116 mg/dL — ABNORMAL HIGH (ref 70–99)
Glucose-Capillary: 126 mg/dL — ABNORMAL HIGH (ref 70–99)

## 2018-03-01 LAB — PHOSPHORUS: Phosphorus: 2.4 mg/dL — ABNORMAL LOW (ref 2.5–4.6)

## 2018-03-01 LAB — MAGNESIUM: Magnesium: 1.8 mg/dL (ref 1.7–2.4)

## 2018-03-01 MED ORDER — TRAVASOL 10 % IV SOLN
INTRAVENOUS | Status: AC
  Administered 2018-03-01: 18:00:00 via INTRAVENOUS
  Filled 2018-03-01: qty 566.4

## 2018-03-01 MED ORDER — POTASSIUM CHLORIDE 10 MEQ/50ML IV SOLN
10.0000 meq | INTRAVENOUS | Status: AC
  Administered 2018-03-01 (×2): 10 meq via INTRAVENOUS
  Filled 2018-03-01 (×2): qty 50

## 2018-03-01 MED ORDER — SODIUM CHLORIDE 0.9 % IV SOLN
INTRAVENOUS | Status: DC
  Administered 2018-03-01 – 2018-03-04 (×2): via INTRAVENOUS

## 2018-03-01 MED ORDER — POTASSIUM PHOSPHATES 15 MMOLE/5ML IV SOLN
15.0000 mmol | Freq: Once | INTRAVENOUS | Status: AC
  Administered 2018-03-01: 15 mmol via INTRAVENOUS
  Filled 2018-03-01: qty 5

## 2018-03-01 MED ORDER — MAGNESIUM SULFATE IN D5W 1-5 GM/100ML-% IV SOLN
1.0000 g | Freq: Once | INTRAVENOUS | Status: AC
  Administered 2018-03-01: 1 g via INTRAVENOUS
  Filled 2018-03-01: qty 100

## 2018-03-01 NOTE — Progress Notes (Signed)
Family Medicine Teaching Service Daily Progress Note Intern Pager: [phone]  Patient name: Barbara Ward Medical record number: [id number] Date of birth: [birth year] Age: 47 y.o. Gender: female  Primary Care Provider: Patient, No Pcp Per Consultants:Gen Surg; GI signed off Code Status:Full  Pt Overview and Major Events to Date: Admitted to FPTS on 7/5/19for Gastric Outlet Obstruction  Assessment and Plan: Barbara Ward is a 47 year old female with history of Prader-Willi syndrome,mental retardation, sleep apnea, hypothyroid, abdominal hernia, hyperlipidemia, and bilateral lower extremity lymphedema with venous stasis presenting withsevere, generalizedabdominal pain radiating to her back.  Gastric Outlet Obstruction CT scanon admission showingmarkedly distended stomach withtransverse colon and omentum herniating into anterior mediastinum, causinggastricoutletobstruction. -NPO, currently on TPN as of 02/28/2018- will follow surgery'sinstructions for advancing diet - NG tube removed (7/9) - Surgery consulted, appreciate recs- Lap hernia repair performed (7/9) - Pain control with Fentanyl77mcg q2hr PRN - monitor pain closely; patients with P-W have high pain thresholds - Hold all other PO meds -GI consulted,EGD on 7/7 removed 1L food/fluid from stomach; signed off to Gen-Surg  Unresponsive episode, resolved Patient with episode of unresponsiveness following fentanyl dose at 0459. RRT arrived and gave patient narcan.Pain regimenhas since been decreased. -decrease fentanyl to q2hrs -d/c continuouspulse ox + end tidal CO2 -monitor closely  Prader-WilliSyndrome, chronic Patient hascognition of ~4-70 year-old.PWS comes with an insatiable appetite,however she has been restricted to a 1200 calorie diet and has a BMI in the healthy range.  - NG tube removed, patient remains NPO - Strict diet control at 1,200 calories/day - Multivitamin and Calcium  supplementation after PO restriction lifted  Hypothyroidism, chronic Home meds:Levothyroxine qDaily PO before breakfast -IV Levothyroxine on at half dose  Hypertension Stable. BP110/70. Home meds:Lasix 40mg  PO qDaily(likely also for venous stasis),Ramipril 10mg  capsule 10mg  PO qAM - Hold Lasix and Ramipril PO restriction lifted  HLD Home meds:d/cLovastatin 20mg  PO qHS--> Atorvastatin 40mg  PO qDaily -Lipid Panel with elevated LDL 113, otherwise WNL - Start high-intensity Atorvastatin 40mg  PO qDaily after NPO lifted  Anxiety Well-controlled with medications. Home meds:Buspirone 5mg  tablet PO BID,Citalopram 40mg  tablet PO qDaily - Hold Buspirone and Citalopram until NG tube removed - Can consider IV benzo if patient anxious while NPO  Sleep Apnea Patient uses CPAP at night; however, current NG tube complicates mask placement - may restart CPAP  FEN/GI:NPO JYN:WGNF  Disposition:pending clinical improvement  Subjective: Patient seen this morning lying in bed, family is in the room.she is day 1 s/p laparoscopc repair of Anterior Diaphragmatic Hernia with Laparoscopic Gastrostomy. She denies headaches, fevers,shortness of breath,chills, nausea,vomiting, diarrhea,andchest pain. She reports mild intermittent abdominal pain well-controlled with fentanyl.Patient had just returned from ambulating the hallway.   Objective: Temp:  [97.8 F (36.6 C)-99 F (37.2 C)] 97.8 F (36.6 C) (07/11 0430) Pulse Rate:  [74-96] 74 (07/11 0430) Resp:  [16] 16 (07/11 0430) BP: (112-120)/(61-81) 112/74 (07/11 0430) SpO2:  [94 %-99 %] 99 % (07/11 0430) Weight:  [175 lb (79.4 kg)] 175 lb (79.4 kg) (07/10 1001) Physical Exam: Constitutional: She is oriented to person, place, and time. She appears well-developed and well-nourished.  HENT:  Head: Atraumatic.  NG tube removed (7/9) L. Central Line (7/10) (correction from previous note)  Eyes: EOM are normal.   Cardiovascular: Normal rate, regular rhythm, normal heart sounds and intact distal pulses.  Pulmonary/Chest: Breath sounds normal.  Abdominal: Soft. Normal appearance and bowel sounds are normal. She exhibits no distension, no abdominal bruit and no ascites. There is generalized tenderness to palpation. There  is guarding (2/2 tenderness). There is no rigidity and no rebound.   Wrapped in bind, G-tube in upper L quadrant with minimal drainage to bag.   Neurological: She is alert and oriented to person, place, and time.  Skin: Skin is warm and dry. Capillary refill takes less than 2 seconds.  Psychiatric: She has a normal mood and affect. Her behavior is normal.    Laboratory: Recent Labs  Lab 02/27/18 0651 02/28/18 0347 03/01/18 0413  WBC 10.7* 13.3* 11.0*  HGB 11.2* 11.8* 10.6*  HCT 36.5 37.8 34.9*  PLT 238 255 188   Recent Labs  Lab 02/23/18 1609 02/24/18 0218  02/27/18 0651 02/28/18 0347 03/01/18 0413  NA 144 147*   < > 146* 145 147*  K 3.5 3.0*   < > 3.7 4.3 3.5  CL 103 103   < > 109 110 114*  CO2 31 31   < > 24 27 27   BUN 9 8   < > 8 6 6   CREATININE 0.80 0.75   < > 0.71 0.77 0.64  CALCIUM 9.8 9.3   < > 8.3* 8.0* 7.9*  PROT 7.0 5.9*  --   --   --  5.0*  BILITOT 0.8 0.8  --   --   --  0.5  ALKPHOS 61 47  --   --   --  42  ALT 14 11  --   --   --  15  AST 17 15  --   --   --  15  GLUCOSE 157* 95   < > 117* 140* 117*   < > = values in this interval not displayed.    Imaging/Diagnostic Tests: Ct Abdomen Pelvis W Contrast  Result Date: 02/23/2018 CLINICAL DATA:  Mid to lower abdominal pain. EXAM: CT ABDOMEN AND PELVIS WITH CONTRAST TECHNIQUE: Multidetector CT imaging of the abdomen and pelvis was performed using the standard protocol following bolus administration of intravenous contrast. CONTRAST:  100mL OMNIPAQUE IOHEXOL 300 MG/ML  SOLN COMPARISON:  None. FINDINGS: Lower chest: Herniated colon is identified anterior to the liver and heart. Hepatobiliary: No focal  abnormality within the liver parenchyma. There is no evidence for gallstones, gallbladder wall thickening, or pericholecystic fluid. No intrahepatic or extrahepatic biliary dilation. Pancreas: No focal mass lesion. No dilatation of the main duct. No intraparenchymal cyst. No peripancreatic edema. Spleen: No splenomegaly. No focal mass lesion. Adrenals/Urinary Tract: No adrenal nodule or mass. Right kidney unremarkable. 6 mm low-density lesion upper pole left kidney is too small to characterize but likely a cyst. No evidence for hydroureter. The urinary bladder appears normal for the degree of distention. Stomach/Bowel: Stomach is massively distended. There is substantial mass-effect on the pyloric region of the stomach (axial image 21 series 3 and coronal image 48 of series 6. Duodenum is moderately distended. Jejunal loops and ileal loops are nondilated. The terminal ileum is normal. The appendix is not visualized, but there is no edema or inflammation in the region of the cecum. Ascending colon unremarkable. Transverse colon extends up into a hernia involving the anterior mediastinum with nondilated segments of transverse colon identified anterior to the heart. Transverse colon than exits down through the hernia defect along the duodenum and then courses posterior to the stomach. Colon can be identified between the stomach and the portal splenic confluence on image 29 of series 3. Distal colon is decompressed and unremarkable. Vascular/Lymphatic: No abdominal aortic aneurysm. No abdominal aortic atherosclerotic calcification. Portal vein and superior mesenteric vein are  patent. There is no gastrohepatic or hepatoduodenal ligament lymphadenopathy. No intraperitoneal or retroperitoneal lymphadenopathy. No pelvic sidewall lymphadenopathy. Reproductive: The uterus has normal CT imaging appearance. There is no adnexal mass. Other: No intraperitoneal free fluid. Musculoskeletal: No worrisome lytic or sclerotic osseous  abnormality. sclerotic focus in the posterior sacrum is likely a bone island. IMPRESSION: Stomach is markedly distended and fluid-filled with evidence of mass-effect on the antral region. This finding is related to herniation of omentum and transverse colon up into the anterior mediastinum, anterior to the heart. The herniated fat and colon generates substantial mass-effect on the distal stomach and proximal duodenum which occupy a high position in the anterior abdomen, in this mass-effect appears to result in a gastric outlet obstruction. There is no evidence of gastric wall thickening, colonic obstruction, or colonic wall thickening. A tiny amount of free fluid is identified in the anterior mediastinal hernia. I discussed these results by telephone at the time of interpretation on 02/23/2018 at 7:57 pm with LESLIE SOFIA, PA . Electronically Signed   By: Kennith Center M.D.   On: 02/23/2018 19:59   Dg Chest Port 1 View  Result Date: 02/27/2018 CLINICAL DATA:  Placement of left-sided central line. EXAM: PORTABLE CHEST 1 VIEW COMPARISON:  Portable chest x-ray of February 23, 2018 FINDINGS: The lungs are hypoinflated. There is no pneumothorax nor significant pleural effusion. There is hazy increased density at both lung bases as well as some linear increased density peripherally in the left mid upper lung. The heart is normal in size. The pulmonary vascularity is mildly prominent. The left internal jugular venous catheter tip projects at the junction of the middle and distal thirds of the SVC. IMPRESSION: No postprocedure complication following left internal jugular venous catheter placement. Bilateral hypoinflation. Bibasilar subsegmental atelectasis. Electronically Signed   By: David  [place] M.D.   On: 02/27/2018 14:45   Dg Chest Portable 1 View  Result Date: 02/23/2018 CLINICAL DATA:  NG tube placement EXAM: PORTABLE CHEST 1 VIEW COMPARISON:  None. FINDINGS: Enteric tube is been placed. The tip is off the field of  view but well below the left hemidiaphragm. Shallow inspiration with linear atelectasis in the lung bases. Heart size and pulmonary vascularity are normal. No airspace disease or consolidation in the lungs. No blunting of costophrenic angles. No pneumothorax. Mediastinal contours appear intact. S-shaped thoracolumbar scoliosis. IMPRESSION: Enteric tube tip is off the field of view but below the left hemidiaphragm. Shallow inspiration with linear atelectasis in the lung bases. Thoracolumbar scoliosis. Electronically Signed   By: Burman Nieves M.D.   On: 02/23/2018 21:19   Dg Abd Portable 1v  Result Date: 02/24/2018 CLINICAL DATA:  Nasogastric tube placement EXAM: PORTABLE ABDOMEN - 1 VIEW COMPARISON:  CT abdomen and pelvis February 23, 2018 FINDINGS: Nasogastric tube tip and side port in stomach. There is stool in the right colon. There is a paucity of gas overall. There is no bowel dilatation or air-fluid level to suggest bowel obstruction. No evident free air. There is contrast in the urinary bladder. IMPRESSION: Nasogastric tube tip and side port in stomach. Overall paucity of gas. While this finding may be seen normally, it may also be indicative of enteritis or early ileus. Bowel obstruction not felt to be likely. Electronically Signed   By: Bretta Bang III M.D.   On: 02/24/2018 10:17    Dollene Cleveland, DO 03/01/2018, 6:59 AM PGY-1, [facility] FPTS Intern pager: [phone], text pages welcome

## 2018-03-01 NOTE — Progress Notes (Addendum)
[facility] Progress Note  2 Days Post-Op  Subjective: CC- sore Patient states that her abdomen is sore this morning. Denies n/v. Passing lots of flatus and burping, no BM. Ambulated multiple times yesterday and already this morning. States that the TPN tastes like a banana milkshake.  Objective: Vital signs in last 24 hours: Temp:  [97.8 F (36.6 C)-99 F (37.2 C)] 97.8 F (36.6 C) (07/11 0430) Pulse Rate:  [74-96] 74 (07/11 0430) Resp:  [16] 16 (07/11 0430) BP: (112-120)/(61-81) 112/74 (07/11 0430) SpO2:  [94 %-99 %] 99 % (07/11 0430) Weight:  [175 lb (79.4 kg)] 175 lb (79.4 kg) (07/10 1001) Last BM Date: 02/23/18  Intake/Output from previous day: 07/10 0701 - 07/11 0700 In: 1796.8 [I.V.:1766.8] Out: 1400 [Urine:1400] Intake/Output this shift: No intake/output data recorded.  PE: Gen:  Alert, NAD, pleasant HEENT: EOM's intact, pupils equal and round Pulm:  CTAB, no W/R/R, effort normal Abd: Soft, ND, appropriately tender, lap incisions cdi, +BS, G-tube site cdi with trace bloody drainage on dressing/bag to gravity with trace dark bloody drainage in bag Skin: no rashes noted, warm and dry   Lab Results:  Recent Labs    02/28/18 0347 03/01/18 0413  WBC 13.3* 11.0*  HGB 11.8* 10.6*  HCT 37.8 34.9*  PLT 255 188   BMET Recent Labs    02/28/18 0347 03/01/18 0413  NA 145 147*  K 4.3 3.5  CL 110 114*  CO2 27 27  GLUCOSE 140* 117*  BUN 6 6  CREATININE 0.77 0.64  CALCIUM 8.0* 7.9*   PT/INR No results for input(s): LABPROT, INR in the last 72 hours. CMP     Component Value Date/Time   NA 147 (H) 03/01/2018 0413   K 3.5 03/01/2018 0413   CL 114 (H) 03/01/2018 0413   CO2 27 03/01/2018 0413   GLUCOSE 117 (H) 03/01/2018 0413   BUN 6 03/01/2018 0413   CREATININE 0.64 03/01/2018 0413   CALCIUM 7.9 (L) 03/01/2018 0413   PROT 5.0 (L) 03/01/2018 0413   ALBUMIN 2.3 (L) 03/01/2018 0413   AST 15 03/01/2018 0413   ALT 15 03/01/2018 0413   ALKPHOS  42 03/01/2018 0413   BILITOT 0.5 03/01/2018 0413   GFRNONAA >60 03/01/2018 0413   GFRAA >60 03/01/2018 0413   Lipase     Component Value Date/Time   LIPASE 26 02/23/2018 1609       Studies/Results: Dg Chest Port 1 View  Result Date: 02/27/2018 CLINICAL DATA:  Placement of left-sided central line. EXAM: PORTABLE CHEST 1 VIEW COMPARISON:  Portable chest x-ray of February 23, 2018 FINDINGS: The lungs are hypoinflated. There is no pneumothorax nor significant pleural effusion. There is hazy increased density at both lung bases as well as some linear increased density peripherally in the left mid upper lung. The heart is normal in size. The pulmonary vascularity is mildly prominent. The left internal jugular venous catheter tip projects at the junction of the middle and distal thirds of the SVC. IMPRESSION: No postprocedure complication following left internal jugular venous catheter placement. Bilateral hypoinflation. Bibasilar subsegmental atelectasis. Electronically Signed   By: Jarius Dieudonne  [place] M.D.   On: 02/27/2018 14:45    Anti-infectives: Anti-infectives (From admission, onward)   Start     Dose/Rate Route Frequency Ordered Stop   02/27/18 1200  cefoTEtan in Dextrose 5% (CEFOTAN) IVPB 2 g  Status:  Discontinued     2 g 100 mL/hr over 30 Minutes Intravenous  Once 02/27/18 1148 02/27/18 1230  02/27/18 1200  cefoTEtan in Dextrose 5% (CEFOTAN) IVPB 2 g  Status:  Discontinued     2 g 100 mL/hr over 30 Minutes Intravenous  Once 02/27/18 1152 02/27/18 1155   02/27/18 1200  cefoTEtan in Dextrose 5% (CEFOTAN) IVPB 2 g  Status:  Discontinued     2 g 100 mL/hr over 30 Minutes Intravenous  Once 02/27/18 1154 02/27/18 1156   02/27/18 1200  cefoTEtan (CEFOTAN) 2 g in sodium chloride 0.9 % 100 mL IVPB  Status:  Discontinued     2 g 200 mL/hr over 30 Minutes Intravenous To Surgery 02/27/18 1154 02/27/18 1523   02/27/18 1200  sodium chloride 0.9 % 100 mL with cefoTEtan (CEFOTAN) 2 g infusion  Status:   Discontinued     100 mL/hr  Intravenous  Once 02/27/18 1155 02/27/18 1156       Assessment/Plan Prader-Willi Syndrome Hypothyroidism HTN HLD Anxiety Sleep apnea  Gastric outlet obstruction Diaphragmatic hernia S/p laparoscopic repair of anterior diaphragmatic hernia, gastrostomy tube 7/9 Dr. Ezzard Standing - POD 2 - G-tube to gravity, flush q8 hours  ID - cefotetan perioperative FEN - TPN, NPO VTE - SCDs, lovenox Foley - d/c today  Plan - Continue NPO and TPN.   Continue G-tube to gravity.   Continue therapies and mobilization.   LOS: 6 days   Franne Forts , [facility] 03/01/2018, 9:43 AM Pager: [phone] Consults: [phone]  Agree with above.  Mom in room. Kymberli looks good.  Some drainage in the gastrostomy bag.  Ovidio Kin, MD, [facility] Pager: [phone] Office phone:  [phone]

## 2018-03-01 NOTE — Plan of Care (Signed)
  Problem: Coping: Goal: Level of anxiety will decrease Outcome: Progressing   Problem: Pain Managment: Goal: General experience of comfort will improve Outcome: Progressing   Problem: Safety: Goal: Ability to remain free from injury will improve Outcome: Progressing   Problem: Skin Integrity: Goal: Risk for impaired skin integrity will decrease Outcome: Progressing   

## 2018-03-01 NOTE — Progress Notes (Signed)
Patient has home CPAP equipment for use.  Visual safety check perfomed on machine, no obvious defects to machine or power supply.

## 2018-03-01 NOTE — Evaluation (Signed)
Occupational Therapy Evaluation Patient Details Name: Barbara Ward MRN: [id number] DOB: 01/04/1971 Today's Date: 03/01/2018    History of Present Illness Pt is a 47 y/o female admitted secondary to incresaed abdominal pain. Found to have hernia of mediastinum. PT is s/p EGD on 7/7 and s/p hernia repair on 7/9. PMH includes cognitive deficits, HTN, Prader Willi syndrome, and OSA on CPAP.   Clinical Impression   Pt with decline in function and safety with ADLs and ADL mobility with decreased strength, balance and endurance. Pt with hx of cognitive impairments (at baseline). PTA, pt lived at a group home, used a 4W RW, independent with basic selfcare. Pt would benefit from acute OT services to address impairments to maximize level of function and safety    Follow Up Recommendations  Home health OT;Supervision/Assistance - 24 hour    Equipment Recommendations  Other (comment)(reacher)    Recommendations for Other Services       Precautions / Restrictions Precautions Precautions: Fall Restrictions Weight Bearing Restrictions: No      Mobility Bed Mobility               General bed mobility comments: In chair upon entry.   Transfers Overall transfer level: Needs assistance Equipment used: 4-wheeled walker Transfers: Sit to/from Stand Sit to Stand: Min assist         General transfer comment: Increased time required secondary to soreness in abdomen.     Balance Overall balance assessment: Needs assistance Sitting-balance support: No upper extremity supported;Feet supported Sitting balance-[name]: Good     Standing balance support: Bilateral upper extremity supported;During functional activity Standing balance-[name]: Poor                             ADL either performed or assessed with clinical judgement   ADL Overall ADL's : Needs assistance/impaired Eating/Feeding: Independent;Sitting   Grooming: Minimal assistance;Min  guard;Standing;With caregiver independent assisting   Upper Body Bathing: Set up;Supervision/ safety;Sitting;With caregiver independent assisting   Lower Body Bathing: Maximal assistance;With caregiver independent assisting   Upper Body Dressing : Set up;Supervision/safety;Sitting;With caregiver independent assisting   Lower Body Dressing: Maximal assistance;With caregiver independent assisting   Toilet Transfer: Minimal assistance;Cueing for safety;RW;BSC;With caregiver independent assisting   Toileting- Clothing Manipulation and Hygiene: Moderate assistance;Sit to/from stand;With caregiver independent assisting       Functional mobility during ADLs: Minimal assistance;Cueing for safety;Rolling walker General ADL Comments: max A with LB ADLs due to abdominal incision and pain     Vision Baseline Vision/History: Wears glasses Wears Glasses: At all times Patient Visual Report: No change from baseline       Perception     Praxis      Pertinent Vitals/Pain Pain Assessment: Faces Faces Pain Scale: Hurts even more Pain Location: abdominal incision  Pain Descriptors / Indicators: Sore;Discomfort Pain Intervention(s): Limited activity within patient's tolerance;Monitored during session;Repositioned;RN gave pain meds during session     Hand Dominance Right   Extremity/Trunk Assessment Upper Extremity Assessment Upper Extremity Assessment: Generalized weakness   Lower Extremity Assessment Lower Extremity Assessment: Defer to PT evaluation   Cervical / Trunk Assessment Cervical / Trunk Assessment: Normal   Communication Communication Communication: Expressive difficulties   Cognition Arousal/Alertness: Awake/alert Behavior During Therapy: WFL for tasks assessed/performed Overall Cognitive Status: History of cognitive impairments - at baseline  General Comments       Exercises     Shoulder Instructions      Home  Living Family/patient expects to be discharged to:: Group home                                 Additional Comments: Has rollator, however, brakes do not work.       Prior Functioning/Environment Level of Independence: Independent with assistive device(s)    ADL's / Homemaking Assistance Needed: independent with bathing, dressing and toileting   Comments: Used rollator for ambulation, however, reports rollator is broken.         OT Problem List: Decreased activity tolerance;Decreased cognition;Impaired balance (sitting and/or standing);Pain      OT Treatment/Interventions: Self-care/ADL training;Therapeutic exercise;Therapeutic activities;DME and/or AE instruction;Patient/family education    OT Goals(Current goals can be found in the care plan section) Acute Rehab OT Goals Patient Stated Goal: to walk  OT Goal Formulation: With patient/family Time For Goal Achievement: 03/15/18 Potential to Achieve Goals: Good ADL Goals Pt Will Perform Grooming: with supervision;with set-up;standing;with caregiver independent in assisting Pt Will Perform Lower Body Bathing: with mod assist;with caregiver independent in assisting Pt Will Perform Lower Body Dressing: with mod assist;with caregiver independent in assisting Pt Will Transfer to Toilet: with min guard assist;with supervision;ambulating;regular height toilet;grab bars Pt Will Perform Toileting - Clothing Manipulation and hygiene: with min assist;sit to/from stand;with caregiver independent in assisting  OT Frequency: Min 2X/week   Barriers to D/C:    no barriers       Co-evaluation              AM-PAC PT "6 Clicks" Daily Activity     Outcome Measure Help from another person eating meals?: None Help from another person taking care of personal grooming?: A Little Help from another person toileting, which includes using toliet, bedpan, or urinal?: A Lot Help from another person bathing (including washing,  rinsing, drying)?: A Lot Help from another person to put on and taking off regular upper body clothing?: None Help from another person to put on and taking off regular lower body clothing?: A Lot 6 Click Score: 17   End of Session Equipment Utilized During Treatment: Rolling walker;Other (comment)(BSC)  Activity Tolerance: Patient tolerated treatment well Patient left: in chair;with call bell/phone within reach;with family/visitor present;with nursing/sitter in room  OT Visit Diagnosis: Muscle weakness (generalized) (M62.81);Pain;Unsteadiness on feet (R26.81) Pain - part of body: (abdomen)                Time: [id number] OT Time Calculation (min): 24 min Charges:  OT General Charges $OT Visit: 1 Visit OT Evaluation $OT Eval Low Complexity: 1 Low G-Codes: OT G-codes **NOT FOR INPATIENT CLASS** Functional Assessment Tool Used: AM-PAC 6 Clicks Daily Activity     Galen ManilaSpencer, Pearlie Lafosse Jeanette 03/01/2018, 1:10 PM

## 2018-03-01 NOTE — Social Work (Addendum)
CSW spoke with group home supervisor April Evans [phone], of [facility], she is following along for pt care recommendations.  [facility], April is in agreement with this plan April states she prefers [facility] since they use them for "all our equipment."  CSW will pass information and April's contact information on to RN Case Manager.    CSW signing off. Please consult if any additional needs arise.  Doy HutchingIsabel H Barclay Ward, LCSWA [facility] Clinical Social Work [phone]

## 2018-03-01 NOTE — Progress Notes (Signed)
PHARMACY - ADULT TOTAL PARENTERAL NUTRITION CONSULT NOTE   Pharmacy Consult:  TPN Indication:  GOO  Patient Measurements: Height: 4\' 6"  (137.2 cm) Weight: 175 lb (79.4 kg) IBW/kg (Calculated) : 31.7 TPN AdjBW (KG): 43.6 Body mass index is 42.19 kg/m.  Assessment:  [age] YOF with Prader-Willi syndrome presented on 02/23/18 with abdominal pain.  CT showed markedly distended stomach causing GOO.  EGD negative for internal lesion.  Patient underwent repair of diaphragmatic hernia and G-tube placement on 02/27/18.  Patient has been NPO for 6 days, will continue to be NPO for the next couple of days, and G-tube is only for draining as of now per Surgery.  Pharmacy consulted to start TPN.    Patient reports that she has been maintaining her weight around 175 lbs.  She used to follow-up with an RD and her diet is consistently at 1200 kCal per day (3 meals and 3 snacks).  She also has edema and her fluid is restricted to 64 oz per day.  GI: BL prealbumin low at 11.2, LBM 7/5 - Pepcid in TPN Endo: hypothyroid on Synthroid, TSH WNL.  No hx DM - CBGs controlled Insulin requirements in the past 24 hours: 0 unit Lytes: high Na/CL, K 4.3 > 3.5, Phos 2.4, Mag low normal Renal: SCr down 0.64, BUN WNL - UOP 0.7 ml/kg/hr, D5LR at 750 ml/hr Pulm: stable on RA Cards: HTN - VSS Hepatobil: LFTs / tbili / TG WNL Neuro: Prader-Willi syndrome.  Pain score 6-8, PRN Fentanyl ID: afebrile, WBC down to 11 - not on abx TPN Access: CVC double lumen placed 02/27/18 TPN start date: 02/28/18  Nutritional Goals (RD rec on 7/10): 1250-1450 kCal and 80-95gm protein per day   Current Nutrition:  TPN   Plan:  Increase TPN to 40 ml/hr (goal rate 60 ml/hr) TPN will provide 57g AA, 134g CHO and 19g ILE for a total of 876 kCal, meeting ~65% of patient's needs Electrolytes in TPN: increase K / Phos / Mag, no Na, max acetate Daily multivitamin and trace elements in TPN Daily Pepcid 40mg  in TPN Continue sensitive SSI  Q6H Increase D5LR back to 100 ml/hr d/t rising Na with rate reduction KPhos 15 mmol IV x 1 Mag sulfate 1gm IV x 1 KCL x 2 runs F/U AM labs, updated height and weight   Darbie Biancardi D. Laney Potashang, PharmD, BCPS, BCCCP 03/01/2018, 8:00 AM

## 2018-03-02 LAB — BASIC METABOLIC PANEL
Anion gap: 8 (ref 5–15)
BUN: 9 mg/dL (ref 6–20)
CALCIUM: 7.9 mg/dL — AB (ref 8.9–10.3)
CO2: 26 mmol/L (ref 22–32)
Chloride: 111 mmol/L (ref 98–111)
Creatinine, Ser: 0.48 mg/dL (ref 0.44–1.00)
GFR calc Af Amer: >60 mL/min (ref >60)
GLUCOSE: 127 mg/dL — AB (ref 70–99)
Potassium: 3.7 mmol/L (ref 3.5–5.1)
Sodium: 145 mmol/L (ref 135–145)

## 2018-03-02 LAB — GLUCOSE, CAPILLARY
Glucose-Capillary: 122 mg/dL — ABNORMAL HIGH (ref 70–99)
Glucose-Capillary: 105 mg/dL — ABNORMAL HIGH (ref 70–99)
Glucose-Capillary: 86 mg/dL (ref 70–99)

## 2018-03-02 LAB — PHOSPHORUS: Phosphorus: 2.6 mg/dL (ref 2.5–4.6)

## 2018-03-02 LAB — MAGNESIUM: Magnesium: 2.2 mg/dL (ref 1.7–2.4)

## 2018-03-02 MED ORDER — TRAVASOL 10 % IV SOLN
INTRAVENOUS | Status: AC
  Administered 2018-03-02: 17:00:00 via INTRAVENOUS
  Filled 2018-03-02: qty 849.6

## 2018-03-02 NOTE — Progress Notes (Signed)
Family Medicine Teaching Service Daily Progress Note Intern Pager: [phone]  Patient name: Barbara Ward Medical record number: [id number] Date of birth: 1971-02-18 Age: 47 y.o. Gender: female  Primary Care Provider: Patient, No Pcp Per Consultants:Gen Surg; GI signed off Code Status:Full  Pt Overview and Major Events to Date: Admitted to FPTS on 7/5/19for Gastric Outlet Obstruction  Assessment and Plan: Barbara Ward is a 47 year old female with history of Prader-Willi syndrome,mental retardation, sleep apnea, hypothyroid, abdominal hernia, hyperlipidemia, and bilateral lower extremity lymphedema with venous stasis presenting withsevere, generalizedabdominal pain radiating to her back.  Gastric Outlet Obstruction: day 3 s/p laparoscopic diaphragmatic hernia repair - Admit to med-surg, attending Dr. Pollie Meyer -NPO, currently on TPN as of 02/28/2018-per surgery plan to advance diet to clear liquids on afternoon of 7/12 - Surgery consulted, appreciate recs-Lap hernia repairperformed(7/9) - Pain control with Fentanyl45mcg q2hr PRN - monitor pain closely; patients with P-W have high pain thresholds - Hold all other PO meds -GI consulted,EGD on 7/7 removed 1L food/fluid from stomach; signed off to Gen-Surg - OT/PT  Unresponsive episode, resolved Patient with episode of unresponsiveness following fentanyl dose at 0459. RRT arrived and gave patient narcan.Pain regimenhas since been decreased. -fentanyl q2hrs PRN Pain -d/c continuouspulse ox + end tidal CO2 -monitor closely  Prader-WilliSyndrome, chronic Patient hascognition of ~36-23 year-old.PWS comes with an insatiable appetite,however she has been restricted to a 1200 calorie diet and has a BMI in the healthy range.  -NG tube removed, patient remains NPO - Strict diet control at 1,200 calories/day - Multivitamin and Calcium supplementation after PO restriction lifted  Hypothyroidism, chronic Home  meds:Levothyroxine qDaily PO before breakfast -IV Levothyroxine on at half dose  Hypertension Stable. BP110/70. Home meds:Lasix 40mg  PO qDaily(likely also for venous stasis),Ramipril 10mg  capsule 10mg  PO qAM - Hold Lasix and RamiprilPO restriction lifted  HLD Home meds:d/cLovastatin 20mg  PO qHS--> Atorvastatin 40mg  PO qDaily -Lipid Panel with elevated LDL 113, otherwise WNL - Start high-intensity Atorvastatin 40mg  PO qDaily after NPO lifted  Anxiety Well-controlled with medications. Home meds:Buspirone 5mg  tablet PO BID,Citalopram 40mg  tablet PO qDaily - Hold Buspirone and Citalopram until NG tube removed - Can consider IV benzo if patient anxious while NPO  Sleep Apnea Patient uses CPAP at night; however, current NG tube complicates mask placement -may restartCPAP  FEN/GI:NPO JYN:WGNF  Disposition:pending clinical improvement  Subjective: Patient seen thismorning lying in bed, family is in the room.she is day 3 s/p laparoscopc repair of Anterior Diaphragmatic Hernia with Laparoscopic Gastrostomy. She denies headaches, fevers,shortness of breath,chills,nausea,vomiting, diarrhea,andchest pain. She reports mild intermittent abdominal pain well-controlled with fentanyl. She has been burping and passing gas.Patient has been ambulating the hallway.  Objective: Temp:  [97.7 F (36.5 C)] 97.7 F (36.5 C) (07/12 0615) Pulse Rate:  [62-72] 72 (07/12 0615) Resp:  [14-16] 16 (07/12 0615) BP: (121-135)/(68-81) 135/81 (07/12 0615) SpO2:  [99 %-100 %] 100 % (07/12 0615) Physical Exam: Constitutional: She isoriented to person, place, and time. She appearswell-developedand well-nourished.  HENT:  Head:Atraumatic. NG tube removed (7/9) L. Central Line (7/10) (correction from previous note) Eyes:EOMare normal.  Cardiovascular:Normal rate,regular rhythm,normal heart soundsand intact distal pulses.  Pulmonary/Chest:Breath sounds  normal.  Abdominal:Soft. Abdominal binder in place. Bowel sounds are normal. She exhibitsno distension,no abdominal bruitand no ascites. There is minimal and improving tenderness to palpation. There isno rigidityor no rebound.  Wrapped in bind, G-tube in upper L quadrant with minimal drainage to bag. Neurological: She isalertand oriented to person, place, and time.  Skin: Skin iswarmand dry.  Capillary refill takesless than 2 seconds.  Psychiatric: She has anormal mood and affect. Herbehavior is normal.  Laboratory: Recent Labs  Lab 02/27/18 0651 02/28/18 0347 03/01/18 0413  WBC 10.7* 13.3* 11.0*  HGB 11.2* 11.8* 10.6*  HCT 36.5 37.8 34.9*  PLT 238 255 188   Recent Labs  Lab 02/23/18 1609 02/24/18 0218  02/28/18 0347 03/01/18 0413 03/02/18 0326  NA 144 147*   < > 145 147* 145  K 3.5 3.0*   < > 4.3 3.5 3.7  CL 103 103   < > 110 114* 111  CO2 31 31   < > 27 27 26   BUN 9 8   < > 6 6 9   CREATININE 0.80 0.75   < > 0.77 0.64 0.48  CALCIUM 9.8 9.3   < > 8.0* 7.9* 7.9*  PROT 7.0 5.9*  --   --  5.0*  --   BILITOT 0.8 0.8  --   --  0.5  --   ALKPHOS 61 47  --   --  42  --   ALT 14 11  --   --  15  --   AST 17 15  --   --  15  --   GLUCOSE 157* 95   < > 140* 117* 127*   < > = values in this interval not displayed.    Imaging/Diagnostic Tests: Ct Abdomen Pelvis W Contrast  Result Date: 02/23/2018 CLINICAL DATA:  Mid to lower abdominal pain. EXAM: CT ABDOMEN AND PELVIS WITH CONTRAST TECHNIQUE: Multidetector CT imaging of the abdomen and pelvis was performed using the standard protocol following bolus administration of intravenous contrast. CONTRAST:  OMNIPAQUE IOHEXOL 300 MG/ML  SOLN COMPARISON:  None. FINDINGS: Lower chest: Herniated colon is identified anterior to the liver and heart. Hepatobiliary: No focal abnormality within the liver parenchyma. There is no evidence for gallstones, gallbladder wall thickening, or pericholecystic fluid. No intrahepatic or  extrahepatic biliary dilation. Pancreas: No focal mass lesion. No dilatation of the main duct. No intraparenchymal cyst. No peripancreatic edema. Spleen: No splenomegaly. No focal mass lesion. Adrenals/Urinary Tract: No adrenal nodule or mass. Right kidney unremarkable. 6 mm low-density lesion upper pole left kidney is too small to characterize but likely a cyst. No evidence for hydroureter. The urinary bladder appears normal for the degree of distention. Stomach/Bowel: Stomach is massively distended. There is substantial mass-effect on the pyloric region of the stomach (axial image 21 series 3 and coronal image 48 of series 6. Duodenum is moderately distended. Jejunal loops and ileal loops are nondilated. The terminal ileum is normal. The appendix is not visualized, but there is no edema or inflammation in the region of the cecum. Ascending colon unremarkable. Transverse colon extends up into a hernia involving the anterior mediastinum with nondilated segments of transverse colon identified anterior to the heart. Transverse colon than exits down through the hernia defect along the duodenum and then courses posterior to the stomach. Colon can be identified between the stomach and the portal splenic confluence on image 29 of series 3. Distal colon is decompressed and unremarkable. Vascular/Lymphatic: No abdominal aortic aneurysm. No abdominal aortic atherosclerotic calcification. Portal vein and superior mesenteric vein are patent. There is no gastrohepatic or hepatoduodenal ligament lymphadenopathy. No intraperitoneal or retroperitoneal lymphadenopathy. No pelvic sidewall lymphadenopathy. Reproductive: The uterus has normal CT imaging appearance. There is no adnexal mass. Other: No intraperitoneal free fluid. Musculoskeletal: No worrisome lytic or sclerotic osseous abnormality. sclerotic focus in the posterior sacrum is  likely a bone island. IMPRESSION: Stomach is markedly distended and fluid-filled with evidence of  mass-effect on the antral region. This finding is related to herniation of omentum and transverse colon up into the anterior mediastinum, anterior to the heart. The herniated fat and colon generates substantial mass-effect on the distal stomach and proximal duodenum which occupy a high position in the anterior abdomen, in this mass-effect appears to result in a gastric outlet obstruction. There is no evidence of gastric wall thickening, colonic obstruction, or colonic wall thickening. A tiny amount of free fluid is identified in the anterior mediastinal hernia. I discussed these results by telephone at the time of interpretation on 02/23/2018 at 7:57 pm with LESLIE SOFIA, PA . Electronically Signed   By: Kennith CenterEric  Mansell M.D.   On: 02/23/2018 19:59   Dg Chest Port 1 View  Result Date: 02/27/2018 CLINICAL DATA:  Placement of left-sided central line. EXAM: PORTABLE CHEST 1 VIEW COMPARISON:  Portable chest x-ray of February 23, 2018 FINDINGS: The lungs are hypoinflated. There is no pneumothorax nor significant pleural effusion. There is hazy increased density at both lung bases as well as some linear increased density peripherally in the left mid upper lung. The heart is normal in size. The pulmonary vascularity is mildly prominent. The left internal jugular venous catheter tip projects at the junction of the middle and distal thirds of the SVC. IMPRESSION: No postprocedure complication following left internal jugular venous catheter placement. Bilateral hypoinflation. Bibasilar subsegmental atelectasis. Electronically Signed   By: David  SwazilandJordan M.D.   On: 02/27/2018 14:45   Dg Chest Portable 1 View  Result Date: 02/23/2018 CLINICAL DATA:  NG tube placement EXAM: PORTABLE CHEST 1 VIEW COMPARISON:  None. FINDINGS: Enteric tube is been placed. The tip is off the field of view but well below the left hemidiaphragm. Shallow inspiration with linear atelectasis in the lung bases. Heart size and pulmonary vascularity are normal.  No airspace disease or consolidation in the lungs. No blunting of costophrenic angles. No pneumothorax. Mediastinal contours appear intact. S-shaped thoracolumbar scoliosis. IMPRESSION: Enteric tube tip is off the field of view but below the left hemidiaphragm. Shallow inspiration with linear atelectasis in the lung bases. Thoracolumbar scoliosis. Electronically Signed   By: Burman NievesWilliam  Stevens M.D.   On: 02/23/2018 21:19   Dg Abd Portable 1v  Result Date: 02/24/2018 CLINICAL DATA:  Nasogastric tube placement EXAM: PORTABLE ABDOMEN - 1 VIEW COMPARISON:  CT abdomen and pelvis February 23, 2018 FINDINGS: Nasogastric tube tip and side port in stomach. There is stool in the right colon. There is a paucity of gas overall. There is no bowel dilatation or air-fluid level to suggest bowel obstruction. No evident free air. There is contrast in the urinary bladder. IMPRESSION: Nasogastric tube tip and side port in stomach. Overall paucity of gas. While this finding may be seen normally, it may also be indicative of enteritis or early ileus. Bowel obstruction not felt to be likely. Electronically Signed   By: Bretta BangWilliam  Woodruff III M.D.   On: 02/24/2018 10:17    Dollene ClevelandAnderson, Hannah C, DO 03/02/2018, 11:30 AM PGY-1, [place] [facility] FPTS Intern pager: [phone], text pages welcome

## 2018-03-02 NOTE — Progress Notes (Addendum)
[facility] Progress Note  3 Days Post-Op  Subjective: CC-  Sitting up in chair, mom at bedside. Denies any abdominal pain this morning. No n/v. Ready to get up and walk in the halls. States that TPN tastes like cherry cheesecake this morning.  Objective: Vital signs in last 24 hours: Temp:  [97.7 F (36.5 C)] 97.7 F (36.5 C) (07/12 0615) Pulse Rate:  [62-72] 72 (07/12 0615) Resp:  [14-16] 16 (07/12 0615) BP: (121-135)/(68-81) 135/81 (07/12 0615) SpO2:  [99 %-100 %] 100 % (07/12 0615) Last BM Date: 03/01/18  Intake/Output from previous day: 07/11 0701 - 07/12 0700 In: 2373.5 [I.V.:2180.2; IV Piggyback:133.3] Out: 700 [Urine:600; Drains:100] Intake/Output this shift: No intake/output data recorded.  PE: Gen: Alert, NAD, pleasant HEENT: EOM's intact, pupils equal and round Pulm: CTAB, no W/R/R, effort normal Abd: Soft,ND, appropriately tender, lap incisions cdi, +BS,G-tube site cdi/bag to gravity with trace dark bloody drainage in bag Skin: no rashes noted, warm and dry  Lab Results:  Recent Labs    02/28/18 0347 03/01/18 0413  WBC 13.3* 11.0*  HGB 11.8* 10.6*  HCT 37.8 34.9*  PLT 255 188   BMET Recent Labs    03/01/18 0413 03/02/18 0326  NA 147* 145  K 3.5 3.7  CL 114* 111  CO2 27 26  GLUCOSE 117* 127*  BUN 6 9  CREATININE 0.64 0.48  CALCIUM 7.9* 7.9*   PT/INR No results for input(s): LABPROT, INR in the last 72 hours. CMP     Component Value Date/Time   NA 145 03/02/2018 0326   K 3.7 03/02/2018 0326   CL 111 03/02/2018 0326   CO2 26 03/02/2018 0326   GLUCOSE 127 (H) 03/02/2018 0326   BUN 9 03/02/2018 0326   CREATININE 0.48 03/02/2018 0326   CALCIUM 7.9 (L) 03/02/2018 0326   PROT 5.0 (L) 03/01/2018 0413   ALBUMIN 2.3 (L) 03/01/2018 0413   AST 15 03/01/2018 0413   ALT 15 03/01/2018 0413   ALKPHOS 42 03/01/2018 0413   BILITOT 0.5 03/01/2018 0413   GFRNONAA >60 03/02/2018 0326   GFRAA >60 03/02/2018 0326   Lipase      Component Value Date/Time   LIPASE 26 02/23/2018 1609       Studies/Results: No results found.  Anti-infectives: Anti-infectives (From admission, onward)   Start     Dose/Rate Route Frequency Ordered Stop   02/27/18 1200  cefoTEtan in Dextrose 5% (CEFOTAN) IVPB 2 g  Status:  Discontinued     2 g 100 mL/hr over 30 Minutes Intravenous  Once 02/27/18 1148 02/27/18 1230   02/27/18 1200  cefoTEtan in Dextrose 5% (CEFOTAN) IVPB 2 g  Status:  Discontinued     2 g 100 mL/hr over 30 Minutes Intravenous  Once 02/27/18 1152 02/27/18 1155   02/27/18 1200  cefoTEtan in Dextrose 5% (CEFOTAN) IVPB 2 g  Status:  Discontinued     2 g 100 mL/hr over 30 Minutes Intravenous  Once 02/27/18 1154 02/27/18 1156   02/27/18 1200  cefoTEtan (CEFOTAN) 2 g in sodium chloride 0.9 % 100 mL IVPB  Status:  Discontinued     2 g 200 mL/hr over 30 Minutes Intravenous To Surgery 02/27/18 1154 02/27/18 1523   02/27/18 1200  sodium chloride 0.9 % 100 mL with cefoTEtan (CEFOTAN) 2 g infusion  Status:  Discontinued     100 mL/hr  Intravenous  Once 02/27/18 1155 02/27/18 1156       Assessment/Plan Prader-Willi Syndrome Hypothyroidism HTN HLD Anxiety  Sleep apnea  Gastric outlet obstruction Diaphragmatic hernia S/p laparoscopic repair of anterior diaphragmatic hernia, gastrostomy tube 7/9 Dr. Ezzard Standing - POD 3 - flush G-tube q8 hours - clamp G-tube  ID -cefotetan perioperative FEN -TPN, NPO VTE -SCDs, lovenox Foley -d/c 7/11  Plan- Continue NPO and TPN. Clamp G-tube today, if she tolerates this will likely advance to sips of clear liquids today. Continue ambulating.   LOS: 7 days    Franne Forts , [facility] Surgery 03/02/2018, 8:40 AM Pager: [phone] Consults: [phone]  Agree with above.  Continues to do well. Both parents at bedside.  Ovidio Kin, MD, [facility] Surgery Pager: [phone] Office phone:  [phone]

## 2018-03-02 NOTE — Progress Notes (Addendum)
Physical Therapy Treatment Patient Details Name: Barbara Ward MRN: [id number] DOB: 1971/05/15 Today's Date: 03/02/2018    History of Present Illness Pt is a 47 y/o female admitted secondary to incresaed abdominal pain. Found to have hernia of mediastinum. PT is s/p EGD on 7/7 and s/p hernia repair on 7/9. PMH includes cognitive deficits, HTN, Prader Willi syndrome, and OSA on CPAP.    PT Comments    Pt performed supine exercises after declining OOB mobility.  She reports she is fatigued from OT session but agreeable to exercises in recliner chair.  Post exercises PTA assisted patient in scooting back into recliner.  Pt presents with a purple bruise of the vast majority of her R lateral calcaneous.  Pt denies pain with movement or palpation.  Informed nursing of finding.      Follow Up Recommendations  Home health PT;Supervision for mobility/OOB     Equipment Recommendations  Other (comment)(new rollator, current rollator is not safe and brake is broken.  )    Recommendations for Other Services       Precautions / Restrictions Precautions Precautions: Fall Precaution Comments: log roll for comfort with abdominal incision Restrictions Weight Bearing Restrictions: No    Mobility  Bed Mobility               General bed mobility comments: seated in recliner, required moderate assistance to scoot back entirely in recliner.  Use of bed pad to assist in scooting.    Transfers          General transfer comment: Pt refused OOB mobility due to fatigue from working with OT earlier.    Ambulation/Gait                 Stairs             Wheelchair Mobility    Modified Rankin (Stroke Patients Only)       Balance Overall balance assessment: Needs assistance Sitting-balance support: No upper extremity supported;Feet supported Sitting balance-[name]: Good                                 Cognition Arousal/Alertness:  Awake/alert Behavior During Therapy: WFL for tasks assessed/performed Overall Cognitive Status: History of cognitive impairments - at baseline                                        Exercises General Exercises - Lower Extremity Ankle Circles/Pumps: AROM;Both;20 reps;Supine Quad Sets: AROM;Both;10 reps;Supine Heel Slides: AAROM;Both;10 reps;Supine Hip ABduction/ADduction: AROM;Both;10 reps;Supine Straight Leg Raises: AROM;Both;10 reps;Supine    General Comments General comments (skin integrity, edema, etc.): mother present throughout session      Pertinent Vitals/Pain Pain Assessment: Faces Faces Pain Scale: Hurts even more Pain Location: abdominal incision  Pain Descriptors / Indicators: Sore;Discomfort Pain Intervention(s): Monitored during session;Repositioned    Home Living                      Prior Function            PT Goals (current goals can now be found in the care plan section) Acute Rehab PT Goals Patient Stated Goal: to walk  Potential to Achieve Goals: Good Progress towards PT goals: Progressing toward goals    Frequency    Min 3X/week      PT Plan  Current plan remains appropriate    Co-evaluation              AM-PAC PT "6 Clicks" Daily Activity  Outcome Measure  Difficulty turning over in bed (including adjusting bedclothes, sheets and blankets)?: A Lot Difficulty moving from lying on back to sitting on the side of the bed? : Unable Difficulty sitting down on and standing up from a chair with arms (e.g., wheelchair, bedside commode, etc,.)?: Unable Help needed moving to and from a bed to chair (including a wheelchair)?: A Little Help needed walking in hospital room?: A Little Help needed climbing 3-5 steps with a railing? : A Little 6 Click Score: 13    End of Session   Activity Tolerance: Patient tolerated treatment well Patient left: in chair;with call bell/phone within reach;with family/visitor  present Nurse Communication: Mobility status PT Visit Diagnosis: Other abnormalities of gait and mobility (R26.89);Unsteadiness on feet (R26.81);Muscle weakness (generalized) (M62.81);Pain Pain - part of body: (abdomen)     Time: 1610-9604 PT Time Calculation (min) (ACUTE ONLY): 18 min  Charges:  $Therapeutic Exercise: 8-22 mins                    G Codes:       Barbara Ward, PTA pager [phone]    Florestine Avers 03/02/2018, 4:54 PM

## 2018-03-02 NOTE — Care Management Note (Addendum)
Case Management Note  Patient Details  Name: Barbara Ward MRN: [id number] Date of Birth: 15-Feb-1971  Subjective/Objective:                    Action/Plan:  Discussed discharge planning with patient and parents at bedside .  [facility] .   Left message for April Evans [phone], of [facility], awaiting call back.  Patient has rollator    Expected Discharge Date:  02/25/18               Expected Discharge Plan:  Group Home  In-House Referral:     Discharge planning Services  CM Consult  Post Acute Care Choice:  Durable Medical Equipment, Home Health Choice offered to:  Parent, Patient  DME Arranged:  Walker rolling with seat DME Agency:  [facility]  HH Arranged:  RN, [facility]  [facility]  Status of Service:  In process, will continue to follow  If discussed at Long Length of Stay Meetings, dates discussed:    Additional Comments:  Kingsley PlanWile, Gracilyn Gunia Marie, RN 03/02/2018, 10:22 AM

## 2018-03-02 NOTE — Progress Notes (Signed)
Nutrition Follow-up  DOCUMENTATION CODES:   Morbid obesity  INTERVENTION:   -TPN management per pharmacy -RD will follow for diet advancement and supplement as appropriate  NUTRITION DIAGNOSIS:   Inadequate oral intake related to altered GI function as evidenced by NPO status.  Ongoing  GOAL:   Patient will meet greater than or equal to 90% of their needs  Progressing  MONITOR:   Diet advancement, Labs, Weight trends, Skin, I & O's  REASON FOR ASSESSMENT:   Consult New TPN/TNA  ASSESSMENT:   Barbara Ward L Pate is a 47 y.o. female presenting with severe abdominal pain 2/2 diaphragmatic and anterior mediastinal herniation. PMH is significant for pedal edema 2/2 venous insufficiency and Prader-Willi Syndrome.  7/5- CT scan revealed dilated stomach withtransverse colon and omentum herniating through the diaphragm into the anterior mediastinum suggesting that this was coming across the distal stomach/duodenum creating the obstruction; NGT placed for decompression 7/7- s/p EGD- revealed large amount of residue in stomach as well as narrowing of prepyloric stomach as well as pyloric channel and duodenal bulb 7/9- s/p lap repair of anterior diaphragmatic hernia, gastrostomy placement 7/10- PICC line placed, TPN initiated  Reviewed I/O's: +1.7 L x 24 hours and +6.1 L since admission  Case discussed with RN, who reports pt is progressing well. G-tube was clamped this morning and pt has been tolerating well. Per RN, potential to transition to sips of clears vs clear liquids tomorrow if pt continues to tolerate clamping trial.   Pt remains on TPN; currently infusing at 40 ml/hr, which provides 876 kcals and 57 grams of protein, meeting 70% of estimated kcal needs and 71% of estimated protein needs.   Labs reviewed: CBGS: 108-126 (inpatient orders for glycemic control are 0-9 units insulin aspart every 6 hours).   Diet Order:   Diet Order           Diet NPO time specified  Diet  effective now          EDUCATION NEEDS:   Education needs have been addressed  Skin:  Skin Assessment: Skin Integrity Issues: Skin Integrity Issues:: Incisions Incisions: closed abdomen  Last BM:  03/01/18  Height:   Ht Readings from Last 1 Encounters:  02/28/18 4\' 6"  (1.372 m)    Weight:   Wt Readings from Last 1 Encounters:  02/28/18 175 lb (79.4 kg)    Ideal Body Weight:  40.9 kg  BMI:  Body mass index is 42.19 kg/m.  Estimated Nutritional Needs:   Kcal:  1250-1450  Protein:  80-95 grams  Fluid:  > 1.2 L    Jaymari Cromie A. Mayford KnifeWilliams, RD, LDN, CDE Pager: [phone] After hours Pager: [phone]

## 2018-03-02 NOTE — Progress Notes (Signed)
Occupational Therapy Treatment Patient Details Name: Barbara Ward MRN: [id number] DOB: 11/22/1970 Today's Date: 03/02/2018    History of present illness Pt is a 47 y/o female admitted secondary to incresaed abdominal pain. Found to have hernia of mediastinum. PT is s/p EGD on 7/7 and s/p hernia repair on 7/9. PMH includes cognitive deficits, HTN, Prader Willi syndrome, and OSA on CPAP.   OT comments  Patient progressing well.  Requires increased time for all activities, but with encouragement able to complete tasks with greater independence.  Min guard assist for toilet transfer and toileting today using RW.  Intermittent cueing for hand placement and safety during transfers.  Will continue to follow while admitted.  Next session focus on LB ADL.    Follow Up Recommendations  Home health OT;Supervision/Assistance - 24 hour    Equipment Recommendations  Other (comment)(reacher)    Recommendations for Other Services      Precautions / Restrictions Precautions Precautions: Fall Restrictions Weight Bearing Restrictions: No       Mobility Bed Mobility               General bed mobility comments: seated in recliner   Transfers Overall transfer level: Needs assistance Equipment used: Rolling walker (2 wheeled) Transfers: Sit to/from Stand Sit to Stand: Min guard         General transfer comment: increased time required, cueing for hand placement and safety    Balance Overall balance assessment: Needs assistance Sitting-balance support: No upper extremity supported;Feet supported Sitting balance-[name]: Good     Standing balance support: During functional activity;Single extremity supported Standing balance-[name]: Poor Standing balance comment: Heavily reliant on UE support.                            ADL either performed or assessed with clinical judgement   ADL Overall ADL's : Needs assistance/impaired     Grooming: Set up;Sitting                   Toilet Transfer: Min guard;RW;Comfort height toilet Toilet Transfer Details (indicate cue type and reason): cueing for hand placement, increased time required Toileting- Architect and Hygiene: Min guard;Sit to/from stand;Cueing for sequencing Toileting - Clothing Manipulation Details (indicate cue type and reason): encouargement to engage in hygiene, able to complete from standing with min guard assist     Functional mobility during ADLs: Min guard;Rolling walker General ADL Comments: increased time for all tasks     Vision       Perception     Praxis      Cognition Arousal/Alertness: Awake/alert Behavior During Therapy: WFL for tasks assessed/performed Overall Cognitive Status: History of cognitive impairments - at baseline                                          Exercises     Shoulder Instructions       General Comments mother present throughout session    Pertinent Vitals/ Pain       Pain Assessment: Faces Faces Pain Scale: Hurts even more Pain Location: abdominal incision  Pain Descriptors / Indicators: Sore;Discomfort Pain Intervention(s): Limited activity within patient's tolerance;Monitored during session;Repositioned  Home Living  Prior Functioning/Environment              Frequency  Min 2X/week        Progress Toward Goals  OT Goals(current goals can now be found in the care plan section)  Progress towards OT goals: Progressing toward goals  Acute Rehab OT Goals Patient Stated Goal: to walk  OT Goal Formulation: With patient/family Time For Goal Achievement: 03/15/18 Potential to Achieve Goals: Good  Plan Discharge plan remains appropriate;Frequency remains appropriate    Co-evaluation                 AM-PAC PT "6 Clicks" Daily Activity     Outcome Measure   Help from another person eating meals?: None Help from  another person taking care of personal grooming?: A Little Help from another person toileting, which includes using toliet, bedpan, or urinal?: A Little Help from another person bathing (including washing, rinsing, drying)?: A Lot Help from another person to put on and taking off regular upper body clothing?: None Help from another person to put on and taking off regular lower body clothing?: A Lot 6 Click Score: 18    End of Session Equipment Utilized During Treatment: Rolling walker;Other (comment)  OT Visit Diagnosis: Muscle weakness (generalized) (M62.81);Pain;Unsteadiness on feet (R26.81) Pain - part of body: (abdomen)   Activity Tolerance Patient tolerated treatment well   Patient Left in chair;with call bell/phone within reach;with family/visitor present   Nurse Communication          Time: [id number] OT Time Calculation (min): 23 min  Charges: OT General Charges $OT Visit: 1 Visit OT Treatments $Self Care/Home Management : 23-37 mins  [name], OTR/L  Pager [phone]    Chancy MilroyChristie S Harleyquinn Gasser 03/02/2018, 4:25 PM

## 2018-03-02 NOTE — Progress Notes (Signed)
PHARMACY - ADULT TOTAL PARENTERAL NUTRITION CONSULT NOTE   Pharmacy Consult:  TPN Indication:  GOO  Patient Measurements: Height: 4\' 6"  (137.2 cm) Weight: 175 lb (79.4 kg) IBW/kg (Calculated) : 31.7 TPN AdjBW (KG): 43.6 Body mass index is 42.19 kg/m.  Assessment:  [age] YOF with Prader-Willi syndrome presented on 02/23/18 with abdominal pain.  CT showed markedly distended stomach causing GOO.  EGD negative for internal lesion.  Patient underwent repair of diaphragmatic hernia and G-tube placement on 02/27/18.  Patient has been NPO for 6 days, will continue to be NPO for the next couple of days, and G-tube is only for draining as of now per Surgery.  Pharmacy consulted to start TPN.    Patient reports that she has been maintaining her weight around 175 lbs.  She used to follow-up with an RD and her diet is consistently at 1200 kCal per day (3 meals and 3 snacks).  She also has edema and her fluid is restricted to 64 oz per day.  GI: BL prealbumin low at 11.2, LBM 7/5 - Pepcid in TPN Endo: hypothyroid on Synthroid, TSH WNL.  No hx DM - CBGs controlled Insulin requirements in the past 24 hours: 0 unit Lytes: Improved with supplementation - now WNL Renal: SCr down 0.48, BUN WNL - UOP not totally recorded, D5LR at 100 ml/hr Pulm: stable on RA Cards: HTN - VSS Hepatobil: LFTs / tbili / TG WNL Neuro: Prader-Willi syndrome.  Pain score 6-8, PRN Fentanyl ID: afebrile, WBC down to 11 - not on abx TPN Access: CVC double lumen placed 02/27/18 TPN start date: 02/28/18  Nutritional Goals (RD rec on 7/10): 1250-1450 kCal and 80-95gm protein per day   Current Nutrition:  TPN   Plan:  Increase TPN to 60 ml/hr TPN will provide 84.96g AA, 201.6 g CHO and 28.8g ILE for a total of 1313 kCal, meeting ~100% of patient's needs Electrolytes in TPN: Continue current supplements - K / Phos / Mag, no Na, max acetate Daily multivitamin and trace elements in TPN Daily Pepcid 40mg  in TPN Continue sensitive SSI  Q6H Continue D5LR at 100 ml/hr F/U Sun. labs  Nadara MustardNita Yasmeen Manka, PharmD., MS Clinical Pharmacist Pager:  [phone] Thank you for allowing pharmacy to be part of this patients care team. 03/02/2018, 8:55 AM

## 2018-03-02 NOTE — Progress Notes (Signed)
Patient has home CPAP unit at bedside. Caregiver (family member at bedside) staying over night with patient. No assistance needed from Respiratory.  Encouraged to call for assistance if needed.

## 2018-03-03 LAB — GLUCOSE, CAPILLARY
GLUCOSE-CAPILLARY: 102 mg/dL — AB (ref 70–99)
GLUCOSE-CAPILLARY: 107 mg/dL — AB (ref 70–99)
GLUCOSE-CAPILLARY: 115 mg/dL — AB (ref 70–99)

## 2018-03-03 MED ORDER — PRAVASTATIN SODIUM 10 MG PO TABS: 10.0000 mg | ORAL_TABLET | Freq: Every day | ORAL | Status: DC

## 2018-03-03 MED ORDER — TRAVASOL 10 % IV SOLN
INTRAVENOUS | Status: DC
  Administered 2018-03-03: 17:00:00 via INTRAVENOUS
  Filled 2018-03-03: qty 849.6

## 2018-03-03 MED ORDER — ATORVASTATIN CALCIUM 40 MG PO TABS
40.0000 mg | ORAL_TABLET | Freq: Every day | ORAL | Status: DC
  Administered 2018-03-04 – 2018-03-05 (×2): 40 mg via ORAL
  Filled 2018-03-03 (×2): qty 1

## 2018-03-03 MED ORDER — LEVOTHYROXINE SODIUM 25 MCG PO TABS
137.0000 ug | ORAL_TABLET | Freq: Every day | ORAL | Status: DC
  Administered 2018-03-04 – 2018-03-06 (×3): 137 ug via ORAL
  Filled 2018-03-03 (×3): qty 1

## 2018-03-03 NOTE — Progress Notes (Signed)
PHARMACY - ADULT TOTAL PARENTERAL NUTRITION CONSULT NOTE   Pharmacy Consult:  TPN Indication:  GOO  Patient Measurements: Height: 4\' 6"  (137.2 cm) Weight: 175 lb (79.4 kg) IBW/kg (Calculated) : 31.7 TPN AdjBW (KG): 43.6 Body mass index is 42.19 kg/m.  Assessment:  [age] YOF with Prader-Willi syndrome presented on 02/23/18 with abdominal pain.  CT showed markedly distended stomach causing GOO.  EGD negative for internal lesion.  Patient underwent repair of diaphragmatic hernia and G-tube placement on 02/27/18.  Patient has been NPO for 6 days, will continue to be NPO for the next couple of days, and G-tube is only for draining as of now per Surgery.  Pharmacy consulted to manage TPN.    Patient reports that she has been maintaining her weight around 175 lbs.  She used to follow-up with an RD and her diet is consistently at 1200 kCal per day (3 meals and 3 snacks).  She also has edema and her fluid is restricted to 64 oz per day.  GI: BL prealbumin low at 11.2, LBM 7/12, drain O/P 25mL - Pepcid in TPN Endo: hypothyroid on Synthroid, TSH WNL.  No hx DM - CBGs controlled Insulin requirements in the past 24 hours: 2 units Lytes: all WNL on 7/12 Renal: SCr down 0.48, BUN WNL - UOP not totally recorded, D5LR at 100 ml/hr Pulm: stable on RA Cards: HTN - VSS Hepatobil: LFTs / tbili / TG WNL Neuro: Prader-Willi syndrome.  Pain score 0-9, PRN Fentanyl ID: afebrile, WBC down to 11 - not on abx TPN Access: CVC double lumen placed 02/27/18 TPN start date: 02/28/18  Nutritional Goals (RD rec on 7/10): 1250-1450 kCal and 80-95gm protein per day   Current Nutrition:  TPN Clear liquid diet   Plan:  Continue TPN at goal rate 60 ml/hr, providing 85g AA, 202g CHO and 29g ILE for a total of 1313 kCal, meeting ~100% of patient's needs Electrolytes in TPN: no change today Daily multivitamin and trace elements in TPN Daily Pepcid 40mg  in TPN Continue sensitive SSI Q6H.  D/C in AM if CBGs remain  stable. Continue D5LR at 100 ml/hr F/U AM labs   Cevin Rubinstein D. Laney Potashang, PharmD, BCPS, BCCCP 03/03/2018, 8:13 AM

## 2018-03-03 NOTE — Progress Notes (Signed)
   General Surgery - [facility]  Assessment & Plan: POD#4 - [facility] gastrostomy tube 7/9 Dr. Ezzard StandingNewman  Gastric outlet obstruction Diaphragmatic hernia  Prader-Willi Syndrome Hypothyroidism HTN HLD Anxiety Sleep apnea  ID -cefotetan perioperative FEN -TPN, clear liquid diet VTE -SCDs, lovenox  Plan:  G-tube clamped  Tolerating clear liquid diet this AM  Pain controlled  Encouraged OOB, ambulation         Velora Hecklerodd Ward. Charl Wellen, MD, [facility]       [facility]       Office: [phone]    Chief Complaint: Gastric outlet obstruction, diaphragmatic hernia  Subjective: Patient up in chair, eating clear liquid breakfast.  Family at bedside.  Passing gas this AM per patient.  Pain controlled.  Objective: Vital signs in last 24 hours: Temp:  [97.7 F (36.5 C)-97.9 F (36.6 C)] 97.7 F (36.5 C) (07/13 0626) Pulse Rate:  [63-69] 69 (07/13 0626) Resp:  [16] 16 (07/13 0626) BP: (124-139)/(78-84) 139/84 (07/13 0626) SpO2:  [98 %-100 %] 98 % (07/13 0626) Last BM Date: 03/02/18  Intake/Output from previous day: 07/12 0701 - 07/13 0700 In: 2925 [I.V.:2895] Out: 25 [Drains:25] Intake/Output this shift: No intake/output data recorded.  Physical Exam: HEENT - sclerae clear, mucous membranes moist Neck - soft Chest - clear bilaterally Cor - RRR Abdomen - soft, mild distension; binder on; G-tube in place with dry dressing; small bilious output - now clamped  Lab Results:  Recent Labs    03/01/18 0413  WBC 11.0*  HGB 10.6*  HCT 34.9*  PLT 188   BMET Recent Labs    03/01/18 0413 03/02/18 0326  NA 147* 145  K 3.5 3.7  CL 114* 111  CO2 27 26  GLUCOSE 117* 127*  BUN 6 9  CREATININE 0.64 0.48  CALCIUM 7.9* 7.9*   PT/INR No results for input(s): LABPROT, INR in the last 72 hours. Comprehensive Metabolic Panel:    Component Value Date/Time   NA 145 03/02/2018 0326   NA 147  (H) 03/01/2018 0413   K 3.7 03/02/2018 0326   K 3.5 03/01/2018 0413   CL 111 03/02/2018 0326   CL 114 (H) 03/01/2018 0413   CO2 26 03/02/2018 0326   CO2 27 03/01/2018 0413   BUN 9 03/02/2018 0326   BUN 6 03/01/2018 0413   CREATININE 0.48 03/02/2018 0326   CREATININE 0.64 03/01/2018 0413   GLUCOSE 127 (H) 03/02/2018 0326   GLUCOSE 117 (H) 03/01/2018 0413   CALCIUM 7.9 (L) 03/02/2018 0326   CALCIUM 7.9 (L) 03/01/2018 0413   AST 15 03/01/2018 0413   AST 15 02/24/2018 0218   ALT 15 03/01/2018 0413   ALT 11 02/24/2018 0218   ALKPHOS 42 03/01/2018 0413   ALKPHOS 47 02/24/2018 0218   BILITOT 0.5 03/01/2018 0413   BILITOT 0.8 02/24/2018 0218   PROT 5.0 (L) 03/01/2018 0413   PROT 5.9 (L) 02/24/2018 0218   ALBUMIN 2.3 (L) 03/01/2018 0413   ALBUMIN 3.3 (L) 02/24/2018 0218    Studies/Results: No results found.    Barbara Ward 03/03/2018  Patient ID: Barbara Ward, female   DOB: December 15, 1970, 47 y.o.   MRN: [id number]

## 2018-03-03 NOTE — Progress Notes (Signed)
Family Medicine Teaching Service Daily Progress Note Intern Pager: [phone]  Patient name: Barbara Ward Medical record number: [id number] Date of birth: [birth year] Age: 47 y.o. Gender: female  Primary Care Provider: Patient, No Pcp Per Consultants:Gen Surg; GI signed off Code Status:Full  Pt Overview and Major Events to Date: Admitted to FPTS on 7/5/19for Gastric Outlet Obstruction  Assessment and Plan: Barbara Ward is a 47 year old female with history of Prader-Willi syndrome,mental retardation, sleep apnea, hypothyroid, abdominal hernia, hyperlipidemia, and bilateral lower extremity lymphedema with venous stasis presenting withsevere, generalizedabdominal pain radiating to her back.  Gastric Outlet Obstruction: day 4 s/p laparoscopic diaphragmatic hernia repair, doing well.  Received four doses of 25 mcg Fentanyl in last 24 hours, with one dose occurring overnight. -advanced to clear liquids on 7/12 - Surgery consulted, appreciate recs-slowly advancing diet - Pain control with Fentanyl62mcg q2hr PRN - monitor pain closely; patients with P-W have high pain thresholds - Hold all other PO meds - OT/PT  Unresponsive episode, resolved Patient with episode of unresponsiveness following fentanyl dose at 0459 on 7/7. RRT arrived and gave patient narcan.Pain regimenhas since been decreased. -fentanyl q2hrs PRN Pain -monitor closely  Prader-WilliSyndrome, chronic Patient hascognition of ~9-29 year-old.PWS comes with an insatiable appetite,however she has been restricted to a 1200 calorie diet and has a BMI in the healthy range.  -NG tube removed, patient remains NPO - Strict diet control at 1,200 calories/day - Multivitamin and Calcium supplementation after PO restriction lifted  Hypothyroidism, chronic Home meds:Levothyroxine qDaily PO before breakfast -IV Levothyroxine on at half dose  Hypertension Well-controlled during admission.  BP124/82. Home meds:Lasix 40mg  PO qDaily(likely also for venous stasis),Ramipril 10mg  capsule 10mg  PO qAM - holding BP medications while NPO  HLD Home meds:d/cLovastatin 20mg  PO qHS--> Atorvastatin 40mg  PO qDaily -Lipid Panel with elevated LDL 113, otherwise WNL - Start high-intensity Atorvastatin 40mg  PO qDaily after NPO lifted  Anxiety Well-controlled with medications. Home meds:Buspirone 5mg  tablet PO BID,Citalopram 40mg  tablet PO qDaily - Hold Buspirone and Citalopram until NG tube removed - Can consider IV benzo if patient anxious while NPO  Sleep Apnea Patient uses CPAP at night; however, current NG tube complicates mask placement -may restartCPAP  FEN/GI: TPN and clear liquids YQM:VHQI  Disposition:pending clinical improvement  Subjective: Patient says her belly is "sore" but otherwise has no complaints.  She is ambulating frequently in the hallway and glad that she can consume clear liquids.  She is passing gas.  Last BM was two days ago.  Objective: Temp:  [97.7 F (36.5 C)-97.9 F (36.6 C)] 97.7 F (36.5 C) (07/12 2202) Pulse Rate:  [63-72] 63 (07/12 2202) Resp:  [16] 16 (07/12 2202) BP: (124-136)/(78-82) 124/82 (07/12 2202) SpO2:  [100 %] 100 % (07/12 2202) Physical Exam: General: sitting in bedside chair, comfortable and talkative L. Central Line (7/10) with TPN infusing Cardiovascular:Normal rate,regular rhythm,normal heart soundsand intact distal pulses.  Pulmonary/Chest:Breath sounds normal.  Abdominal:Soft.  Minimally ttp.   Wrapped in bind, G-tube in upper L quadrant with minimal drainage to bag. Skin: Skin iswarmand dry. Capillary refill takesless than 2 seconds.  Ecchymosis on lateral R ankle.  Laboratory: Recent Labs  Lab 02/27/18 0651 02/28/18 0347 03/01/18 0413  WBC 10.7* 13.3* 11.0*  HGB 11.2* 11.8* 10.6*  HCT 36.5 37.8 34.9*  PLT 238 255 188   Recent Labs  Lab 02/24/18 0218  02/28/18 0347 03/01/18 0413  03/02/18 0326  NA 147*   < > 145 147* 145  K 3.0*   < >  4.3 3.5 3.7  CL 103   < > 110 114* 111  CO2 31   < > 27 27 26   BUN 8   < > 6 6 9   CREATININE 0.75   < > 0.77 0.64 0.48  CALCIUM 9.3   < > 8.0* 7.9* 7.9*  PROT 5.9*  --   --  5.0*  --   BILITOT 0.8  --   --  0.5  --   ALKPHOS 47  --   --  42  --   ALT 11  --   --  15  --   AST 15  --   --  15  --   GLUCOSE 95   < > 140* 117* 127*   < > = values in this interval not displayed.    Imaging/Diagnostic Tests: Ct Abdomen Pelvis W Contrast  Result Date: 02/23/2018 CLINICAL DATA:  Mid to lower abdominal pain. EXAM: CT ABDOMEN AND PELVIS WITH CONTRAST TECHNIQUE: Multidetector CT imaging of the abdomen and pelvis was performed using the standard protocol following bolus administration of intravenous contrast. CONTRAST:  100mL OMNIPAQUE IOHEXOL 300 MG/ML  SOLN COMPARISON:  None. FINDINGS: Lower chest: Herniated colon is identified anterior to the liver and heart. Hepatobiliary: No focal abnormality within the liver parenchyma. There is no evidence for gallstones, gallbladder wall thickening, or pericholecystic fluid. No intrahepatic or extrahepatic biliary dilation. Pancreas: No focal mass lesion. No dilatation of the main duct. No intraparenchymal cyst. No peripancreatic edema. Spleen: No splenomegaly. No focal mass lesion. Adrenals/Urinary Tract: No adrenal nodule or mass. Right kidney unremarkable. 6 mm low-density lesion upper pole left kidney is too small to characterize but likely a cyst. No evidence for hydroureter. The urinary bladder appears normal for the degree of distention. Stomach/Bowel: Stomach is massively distended. There is substantial mass-effect on the pyloric region of the stomach (axial image 21 series 3 and coronal image 48 of series 6. Duodenum is moderately distended. Jejunal loops and ileal loops are nondilated. The terminal ileum is normal. The appendix is not visualized, but there is no edema or inflammation in the region of  the cecum. Ascending colon unremarkable. Transverse colon extends up into a hernia involving the anterior mediastinum with nondilated segments of transverse colon identified anterior to the heart. Transverse colon than exits down through the hernia defect along the duodenum and then courses posterior to the stomach. Colon can be identified between the stomach and the portal splenic confluence on image 29 of series 3. Distal colon is decompressed and unremarkable. Vascular/Lymphatic: No abdominal aortic aneurysm. No abdominal aortic atherosclerotic calcification. Portal vein and superior mesenteric vein are patent. There is no gastrohepatic or hepatoduodenal ligament lymphadenopathy. No intraperitoneal or retroperitoneal lymphadenopathy. No pelvic sidewall lymphadenopathy. Reproductive: The uterus has normal CT imaging appearance. There is no adnexal mass. Other: No intraperitoneal free fluid. Musculoskeletal: No worrisome lytic or sclerotic osseous abnormality. sclerotic focus in the posterior sacrum is likely a bone island. IMPRESSION: Stomach is markedly distended and fluid-filled with evidence of mass-effect on the antral region. This finding is related to herniation of omentum and transverse colon up into the anterior mediastinum, anterior to the heart. The herniated fat and colon generates substantial mass-effect on the distal stomach and proximal duodenum which occupy a high position in the anterior abdomen, in this mass-effect appears to result in a gastric outlet obstruction. There is no evidence of gastric wall thickening, colonic obstruction, or colonic wall thickening. A tiny amount of free fluid is identified  in the anterior mediastinal hernia. I discussed these results by telephone at the time of interpretation on 02/23/2018 at 7:57 pm with LESLIE SOFIA, PA . Electronically Signed   By: Kennith Center M.D.   On: 02/23/2018 19:59   Dg Chest Port 1 View  Result Date: 02/27/2018 CLINICAL DATA:  Placement  of left-sided central line. EXAM: PORTABLE CHEST 1 VIEW COMPARISON:  Portable chest x-ray of February 23, 2018 FINDINGS: The lungs are hypoinflated. There is no pneumothorax nor significant pleural effusion. There is hazy increased density at both lung bases as well as some linear increased density peripherally in the left mid upper lung. The heart is normal in size. The pulmonary vascularity is mildly prominent. The left internal jugular venous catheter tip projects at the junction of the middle and distal thirds of the SVC. IMPRESSION: No postprocedure complication following left internal jugular venous catheter placement. Bilateral hypoinflation. Bibasilar subsegmental atelectasis. Electronically Signed   By: David  [place] M.D.   On: 02/27/2018 14:45   Dg Chest Portable 1 View  Result Date: 02/23/2018 CLINICAL DATA:  NG tube placement EXAM: PORTABLE CHEST 1 VIEW COMPARISON:  None. FINDINGS: Enteric tube is been placed. The tip is off the field of view but well below the left hemidiaphragm. Shallow inspiration with linear atelectasis in the lung bases. Heart size and pulmonary vascularity are normal. No airspace disease or consolidation in the lungs. No blunting of costophrenic angles. No pneumothorax. Mediastinal contours appear intact. S-shaped thoracolumbar scoliosis. IMPRESSION: Enteric tube tip is off the field of view but below the left hemidiaphragm. Shallow inspiration with linear atelectasis in the lung bases. Thoracolumbar scoliosis. Electronically Signed   By: Burman Nieves M.D.   On: 02/23/2018 21:19   Dg Abd Portable 1v  Result Date: 02/24/2018 CLINICAL DATA:  Nasogastric tube placement EXAM: PORTABLE ABDOMEN - 1 VIEW COMPARISON:  CT abdomen and pelvis February 23, 2018 FINDINGS: Nasogastric tube tip and side port in stomach. There is stool in the right colon. There is a paucity of gas overall. There is no bowel dilatation or air-fluid level to suggest bowel obstruction. No evident free air. There is  contrast in the urinary bladder. IMPRESSION: Nasogastric tube tip and side port in stomach. Overall paucity of gas. While this finding may be seen normally, it may also be indicative of enteritis or early ileus. Bowel obstruction not felt to be likely. Electronically Signed   By: Bretta Bang III M.D.   On: 02/24/2018 10:17    Lennox Solders, MD 03/03/2018, 1:00 AM PGY-2, [facility] FPTS Intern pager: [phone], text pages welcome

## 2018-03-04 LAB — BASIC METABOLIC PANEL
ANION GAP: 6 (ref 5–15)
BUN: 11 mg/dL (ref 6–20)
CALCIUM: 8 mg/dL — AB (ref 8.9–10.3)
CO2: 23 mmol/L (ref 22–32)
Chloride: 107 mmol/L (ref 98–111)
Creatinine, Ser: 0.45 mg/dL (ref 0.44–1.00)
GLUCOSE: 186 mg/dL — AB (ref 70–99)
POTASSIUM: 4.8 mmol/L (ref 3.5–5.1)
Sodium: 136 mmol/L (ref 135–145)

## 2018-03-04 LAB — MAGNESIUM: Magnesium: 1.9 mg/dL (ref 1.7–2.4)

## 2018-03-04 LAB — PHOSPHORUS: PHOSPHORUS: 3.9 mg/dL (ref 2.5–4.6)

## 2018-03-04 LAB — GLUCOSE, CAPILLARY
GLUCOSE-CAPILLARY: 107 mg/dL — AB (ref 70–99)
GLUCOSE-CAPILLARY: 110 mg/dL — AB (ref 70–99)

## 2018-03-04 MED ORDER — TRAVASOL 10 % IV SOLN
INTRAVENOUS | Status: DC
  Filled 2018-03-04: qty 424.8

## 2018-03-04 MED ORDER — FAMOTIDINE 20 MG PO TABS
20.0000 mg | ORAL_TABLET | Freq: Two times a day (BID) | ORAL | Status: DC
  Administered 2018-03-04 – 2018-03-06 (×4): 20 mg via ORAL
  Filled 2018-03-04 (×4): qty 1

## 2018-03-04 MED ORDER — TRAMADOL HCL 50 MG PO TABS
50.0000 mg | ORAL_TABLET | Freq: Four times a day (QID) | ORAL | Status: DC | PRN
  Administered 2018-03-04 (×2): 100 mg via ORAL
  Administered 2018-03-04: 50 mg via ORAL
  Administered 2018-03-05 – 2018-03-06 (×3): 100 mg via ORAL
  Administered 2018-03-06: 50 mg via ORAL
  Filled 2018-03-04 (×3): qty 2
  Filled 2018-03-04: qty 1
  Filled 2018-03-04 (×2): qty 2
  Filled 2018-03-04: qty 1

## 2018-03-04 MED ORDER — ACETAMINOPHEN 325 MG PO TABS
650.0000 mg | ORAL_TABLET | ORAL | Status: DC | PRN
  Administered 2018-03-04 – 2018-03-06 (×4): 650 mg via ORAL
  Filled 2018-03-04 (×4): qty 2

## 2018-03-04 NOTE — Progress Notes (Signed)
Family Medicine Teaching Service Daily Progress Note Intern Pager: [phone]  Patient name: Barbara Ward Medical record number: [id number] Date of birth: 1970-09-18 Age: 47 y.o. Gender: female  Primary Care Provider: Patient, No Pcp Per Consultants:Gen Surg; GI signed off Code Status:Full  Pt Overview and Major Events to Date: 02/23/2018 - Admitted for Gastric Outlet Obstruction 02/25/2018 - EGD 02/27/2018 - Laparoscopic Repair of Hiatal Hernia and Gastrostomy Tube  Assessment and Plan: Barbara Ward is a 47 year old female with history of Prader-Willi syndrome,mental retardation, sleep apnea, hypothyroid, abdominal hernia, hyperlipidemia, and bilateral lower extremity lymphedema with venous stasis presenting withsevere, generalizedabdominal pain radiating to her back.  Gastric Outlet Obstruction, improving: day 5 s/p laparoscopic diaphragmatic hernia repair, doing well.  - Surgery consulted, appreciate recs-slowly advancing diet - Pain control Tramadol 50 PO q6 PRN - continue PT/OT - d/c TPN (7/14), maintain full liquid diet  Unresponsive episode, resolved Patient with episode of unresponsiveness following fentanyl dose at 0459 on 7/7. RRT arrived and gave patient narcan.Pain regimenhas since been decreased. -fentanyl q2hrs PRN Pain -monitor closely  Prader-WilliSyndrome, chronic Patient hascognition of ~5-59 year-old.PWS comes with an insatiable appetite,however she has been restricted to a 1200 calorie diet and has a BMI in the healthy range.  -NG tube removed, patient remains NPO - Strict diet control at 1,200 calories/day - Multivitamin and Calcium supplementation after PO restriction lifted  Hypothyroidism, chronic Home meds:Levothyroxine qDaily PO before breakfast -IV Levothyroxine on at half dose  Hypertension, stable Well-controlled during admission. BP124/82. Home meds:Lasix 40mg  PO qDaily(likely also for venous stasis),Ramipril 10mg   capsule 10mg  PO qAM - holding BP medications while NPO  HLD Home meds:d/cLovastatin 20mg  PO qHS--> Atorvastatin 40mg  PO qDaily -Lipid Panel with elevated LDL 113, otherwise WNL - Start high-intensity Atorvastatin 40mg  PO qDaily after NPO lifted  Anxiety Well-controlled with medications. Home meds:Buspirone 5mg  tablet PO BID,Citalopram 40mg  tablet PO qDaily - Hold Buspirone and Citalopram until NG tube removed - Can consider IV benzo if patient anxious while NPO  Sleep Apnea Patient uses CPAP at night; however, current NG tube complicates mask placement -may restartCPAP  FEN/GI: TPN and clear liquids JYN:WGNF  Disposition:d/c to Group Home pending clinical improvement  Subjective: Patient seen sitting upright in recliner, sipping coffee and soup without problems.  She denies belly pain, nausea, vomiting, diarrhea, and constipation, and states she is doing well and looking forward to eating ice cream and mashed potatoes. She is ambulating frequently in the hallway and has been consuming clear liquids, coffee, and ice pops. She is passing gas.   Objective: Temp:  [97.7 F (36.5 C)-98.3 F (36.8 C)] 98.3 F (36.8 C) (07/14 0512) Pulse Rate:  [77-84] 79 (07/14 0616) Resp:  [19] 19 (07/14 0512) BP: (99-112)/(47-67) 112/67 (07/14 0616) SpO2:  [100 %] 100 % (07/14 0512) Physical Exam: General: sitting in bedside chair, comfortable and talkative L. Central Line (7/10) Cardiovascular:Normal rate,regular rhythm,normal heart soundsand intact distal pulses.  Pulmonary/Chest:Breath sounds normal.  Abdominal:Normal bowel sounds in 4 quadrants, Binder in place, soft to palpation with minimal tenderness to palpation around G-tube. G-tube in upper L quadrant. Skin: Skin iswarmand dry. Capillary refill takesless than 2 seconds.  Laboratory: Recent Labs  Lab 02/27/18 0651 02/28/18 0347 03/01/18 0413  WBC 10.7* 13.3* 11.0*  HGB 11.2* 11.8* 10.6*  HCT 36.5  37.8 34.9*  PLT 238 255 188   Recent Labs  Lab 03/01/18 0413 03/02/18 0326 03/04/18 0427  NA 147* 145 136  K 3.5 3.7 4.8  CL 114*  111 107  CO2 27 26 23   BUN 6 9 11   CREATININE 0.64 0.48 0.45  CALCIUM 7.9* 7.9* 8.0*  PROT 5.0*  --   --   BILITOT 0.5  --   --   ALKPHOS 42  --   --   ALT 15  --   --   AST 15  --   --   GLUCOSE 117* 127* 186*    Imaging/Diagnostic Tests: Ct Abdomen Pelvis W Contrast 02/23/2018 IMPRESSION: Stomach is markedly distended and fluid-filled with evidence of mass-effect on the antral region. This finding is related to herniation of omentum and transverse colon up into the anterior mediastinum, anterior to the heart. The herniated fat and colon generates substantial mass-effect on the distal stomach and proximal duodenum which occupy a high position in the anterior abdomen, in this mass-effect appears to result in a gastric outlet obstruction. There is no evidence of gastric wall thickening, colonic obstruction, or colonic wall thickening. A tiny amount of free fluid is identified in the anterior mediastinal hernia.  Dg Chest Port 1 View 02/27/2018 IMPRESSION: No postprocedure complication following left internal jugular venous catheter placement. Bilateral hypoinflation. Bibasilar subsegmental atelectasis   Dg Chest Portable 1 View 02/23/2018 IMPRESSION: Enteric tube tip is off the field of view but below the left hemidiaphragm. Shallow inspiration with linear atelectasis in the lung bases. Thoracolumbar scoliosis.   Dg Abd Portable 1v 02/24/2018 IMPRESSION: Nasogastric tube tip and side port in stomach. Overall paucity of gas. While this finding may be seen normally, it may also be indicative of enteritis or early ileus. Bowel obstruction not felt to be likely.    Dollene ClevelandAnderson, Jessah Danser C, DO 03/04/2018, 9:35 AM PGY-1, [facility] FPTS Intern pager: [phone], text pages welcome

## 2018-03-04 NOTE — Progress Notes (Signed)
     Assessment & Plan:  POD#5 - S/p laparoscopic repair anterior diaphragmatic hernia, gastrostomy tube 7/9 - Dr. Ezzard StandingNewman  Gastric outlet obstruction Diaphragmatic hernia  Prader-Willi Syndrome Hypothyroidism HTN HLD Anxiety Sleep apnea  FEN -advance to full liquid diet today VTE -SCDs, lovenox  Plan:             G-tube clamped             Advance to full liquid diet today             Pain controlled - po pain Rx             Encouraged OOB, ambulation         [name], MD, [facility]       [facility]       Office: [phone]   Chief Complaint: Diaphragmatic hernia  Subjective: Patient doing very well, ambulating with assist.  Tolerating clear liquids.  Wants po pain Rx.  Objective: Vital signs in last 24 hours: Temp:  [97.7 F (36.5 C)-98.3 F (36.8 C)] 98.3 F (36.8 C) (07/14 0512) Pulse Rate:  [77-84] 79 (07/14 0616) Resp:  [19] 19 (07/14 0512) BP: (99-112)/(47-67) 112/67 (07/14 0616) SpO2:  [100 %] 100 % (07/14 0512) Last BM Date: 03/03/18  Intake/Output from previous day: 07/13 0701 - 07/14 0700 In: 2070 [P.O.:120; I.V.:1920] Out: 1200 [Urine:1200] Intake/Output this shift: No intake/output data recorded.  Physical Exam: HEENT - sclerae clear, mucous membranes moist Neck - soft Chest - clear bilaterally Cor - RRR Abdomen - soft, mild tenderness Ext - no edema, non-tender  Lab Results:  No results for input(s): WBC, HGB, HCT, PLT in the last 72 hours. BMET Recent Labs    03/02/18 0326 03/04/18 0427  NA 145 136  K 3.7 4.8  CL 111 107  CO2 26 23  GLUCOSE 127* 186*  BUN 9 11  CREATININE 0.48 0.45  CALCIUM 7.9* 8.0*   PT/INR No results for input(s): LABPROT, INR in the last 72 hours. Comprehensive Metabolic Panel:    Component Value Date/Time   NA 136 03/04/2018 0427   NA 145 03/02/2018 0326   K 4.8 03/04/2018 0427   K 3.7 03/02/2018 0326   CL 107 03/04/2018 0427   CL 111 03/02/2018 0326   CO2 23  03/04/2018 0427   CO2 26 03/02/2018 0326   BUN 11 03/04/2018 0427   BUN 9 03/02/2018 0326   CREATININE 0.45 03/04/2018 0427   CREATININE 0.48 03/02/2018 0326   GLUCOSE 186 (H) 03/04/2018 0427   GLUCOSE 127 (H) 03/02/2018 0326   CALCIUM 8.0 (L) 03/04/2018 0427   CALCIUM 7.9 (L) 03/02/2018 0326   AST 15 03/01/2018 0413   AST 15 02/24/2018 0218   ALT 15 03/01/2018 0413   ALT 11 02/24/2018 0218   ALKPHOS 42 03/01/2018 0413   ALKPHOS 47 02/24/2018 0218   BILITOT 0.5 03/01/2018 0413   BILITOT 0.8 02/24/2018 0218   PROT 5.0 (L) 03/01/2018 0413   PROT 5.9 (L) 02/24/2018 0218   ALBUMIN 2.3 (L) 03/01/2018 0413   ALBUMIN 3.3 (L) 02/24/2018 0218    Studies/Results: No results found.    Barbara Ward M 03/04/2018  Patient ID: Barbara Ward L Liddell, female   DOB: Apr 12, 1971, 47 y.o.   MRN: [id number]

## 2018-03-04 NOTE — Progress Notes (Signed)
Patient requires no assistance with home CPAP, currently on and resting at this time, chest rise and fall observed, RCP will continue to follow.

## 2018-03-04 NOTE — Progress Notes (Addendum)
PHARMACY - ADULT TOTAL PARENTERAL NUTRITION CONSULT NOTE   Pharmacy Consult:  TPN Indication:  GOO  Patient Measurements: Height: 4\' 6"  (137.2 cm) Weight: 175 lb (79.4 kg) IBW/kg (Calculated) : 31.7 TPN AdjBW (KG): 43.6 Body mass index is 42.19 kg/m.  Assessment:  [age] YOF with Prader-Willi syndrome presented on 02/23/18 with abdominal pain.  CT showed markedly distended stomach causing GOO.  EGD negative for internal lesion.  Patient underwent repair of diaphragmatic hernia and G-tube placement on 02/27/18.  Patient has been NPO for 6 days, will continue to be NPO for the next couple of days, and G-tube is only for draining as of now per Surgery.  Pharmacy consulted to manage TPN.    Patient reports that she has been maintaining her weight around 175 lbs.  She used to follow-up with an RD and her diet is consistently at 1200 kCal per day (3 meals and 3 snacks).  She also has edema and her fluid is restricted to 64 oz per day.  GI: BL prealbumin low at 11.2, LBM 7/12, no drain O/P - Pepcid in TPN.  Ate 100% of clear liquid diet and looks forward to diet advancement to order mushroom cream soup Endo: hypothyroid on Synthroid, TSH WNL.  No hx DM - CBGs controlled Insulin requirements in the past 24 hours: 0 unit Lytes: all WNL (K high normal, Na / Mag trending down) Renal: SCr 0.45, BUN WNL - UOP 0.6 ml/kg/hr, D5LR at 100 ml/hr, NS at 10 ml/hr Pulm: stable on RA Cards: HTN - VSS Hepatobil: LFTs / tbili / TG WNL Neuro: Prader-Willi syndrome.  Pain score 0-9, PRN Fentanyl ID: afebrile, WBC down to 11 - not on abx TPN Access: CVC double lumen placed 02/27/18 TPN start date: 02/28/18  Nutritional Goals (RD rec on 7/10): 1250-1450 kCal and 80-95gm protein per day   Current Nutrition:  TPN Advance to full liquid diet   Plan:  Reduce TPN to 30 ml/hr (goal rate 60 ml/hr) per discussion with Surgery TPN will provide 42g AA, 101g CHO and 14g ILE for a total of 657 kCal, meeting ~50% of patient's  needs Electrolytes in TPN: increase Mag slightly, reduce K / Phos slightly, no Na, max acetate - fluctuation with TPN reduction Daily multivitamin and trace elements in TPN Daily Pepcid 40mg  in TPN D/C IVF given normalized sodium, per discussion with Surgery - watch trend off IVF D/C SSI/CBG checks F/U AM labs, PO intake/diet advancement to wean off of TPN   Yovan Leeman D. Laney Potashang, PharmD, BCPS, BCCCP 03/04/2018, 9:48 AM   ============================   Addendum: Stop TPN today per FMTS Reduce TPN to 30 ml/hr, then stop at 1800 Pepcid 20mg  PO BID D/C TPN labs and nursing care orders   Taesean Reth D. Laney Potashang, PharmD, BCPS, BCCCP 03/04/2018, 12:22 PM

## 2018-03-04 NOTE — Progress Notes (Signed)
Occupational Therapy Treatment Patient Details Name: Barbara Ward MRN: [id number] DOB: 10-Mar-1971 Today's Date: 03/04/2018    History of present illness Pt is a 47 y/o female admitted secondary to incresaed abdominal pain. Found to have hernia of mediastinum. PT is s/p EGD on 7/7 and s/p hernia repair on 7/9. PMH includes cognitive deficits, HTN, Prader Willi syndrome, and OSA on CPAP.   OT comments  Pt. Required encouragement for participation but once up, making gains with skilled OT.  Able to complete bed mobility, and in room ambulation to recliner.  LB ADLs requiring assistance secondary to abdominal pain but pts. Mother present and reports she will assist with LB ADLs until pt. Able to resume her prior levels of activity.    Follow Up Recommendations  Home health OT;Supervision/Assistance - 24 hour    Equipment Recommendations       Recommendations for Other Services      Precautions / Restrictions Precautions Precautions: Fall Precaution Comments: log roll for comfort with abdominal incision Restrictions Weight Bearing Restrictions: No       Mobility Bed Mobility Overal bed mobility: Needs Assistance Bed Mobility: Sidelying to Sit;Supine to Sit   Sidelying to sit: Min assist Supine to sit: Mod assist     General bed mobility comments: increased time to initiate movement of b les. cues to utilize bed rails for ue assistance. able to bring bles to eob but required physicial assistance to guide trunk upright while scooting hips to eob  Transfers Overall transfer level: Needs assistance Equipment used: Rolling walker (2 wheeled) Transfers: Sit to/from [employer] Sit to Stand: Min guard Stand pivot transfers: Min guard       General transfer comment: able to scoot hips back in recliner once seated with use of b ues on arm rests    Balance                                           ADL either performed or assessed with clinical  judgement   ADL Overall ADL's : Needs assistance/impaired             Lower Body Bathing: Maximal assistance;With caregiver independent assisting Lower Body Bathing Details (indicate cue type and reason): pt. and pts. mother decline need for A/E, mother states pt. completed all UB/LB ADLs prior to admit and feels once she is healed she will be able to resume her previous activities       Lower Body Dressing Details (indicate cue type and reason): pt. and pts. mother decline need for A/E, mother states pt. completed all UB/LB ADLs prior to admit and feels once she is healed she will be able to resume her previous activities Toilet Transfer: Min guard;Ambulation;RW Toilet Transfer Details (indicate cue type and reason): cueing for hand placement, increased time required-simulated with oob, amb. around the bed to the recliner (declined need for use and declined amb. to b.room) Toileting- Architect and Hygiene: Min guard;Sit to/from stand;Cueing for sequencing Toileting - Clothing Manipulation Details (indicate cue type and reason): simulated     Functional mobility during ADLs: Min guard;Rolling walker General ADL Comments: increased time for all tasks, pt./mother decline need for A/E for LB ADLs. mother reports she will assist with until pt. able to resume     Vision       Perception     Praxis  Cognition Arousal/Alertness: Awake/alert Behavior During Therapy: WFL for tasks assessed/performed Overall Cognitive Status: History of cognitive impairments - at baseline                                          Exercises     Shoulder Instructions       General Comments      Pertinent Vitals/ Pain       Pain Assessment: No/denies pain  Home Living                                          Prior Functioning/Environment              Frequency  Min 2X/week        Progress Toward Goals  OT Goals(current goals can  now be found in the care plan section)  Progress towards OT goals: Progressing toward goals     Plan Discharge plan remains appropriate;Frequency remains appropriate    Co-evaluation                 AM-PAC PT "6 Clicks" Daily Activity     Outcome Measure   Help from another person eating meals?: None Help from another person taking care of personal grooming?: A Little Help from another person toileting, which includes using toliet, bedpan, or urinal?: A Little Help from another person bathing (including washing, rinsing, drying)?: A Lot Help from another person to put on and taking off regular upper body clothing?: None Help from another person to put on and taking off regular lower body clothing?: A Lot 6 Click Score: 18    End of Session Equipment Utilized During Treatment: Gait belt;Rolling walker  OT Visit Diagnosis: Muscle weakness (generalized) (M62.81);Pain;Unsteadiness on feet (R26.81)   Activity Tolerance Patient tolerated treatment well   Patient Left in chair;with call bell/phone within reach;with family/visitor present   Nurse Communication          Time: [id number] OT Time Calculation (min): 23 min  Charges: OT General Charges $OT Visit: 1 Visit OT Treatments $Self Care/Home Management : 23-37 mins   Robet LeuMorris, Tikisha Molinaro Lorraine, COTA/L 03/04/2018, 8:45 AM

## 2018-03-04 NOTE — Progress Notes (Signed)
Family Medicine Teaching Service Daily Progress Note Intern Pager: [phone]  Patient name: Barbara Ward Medical record number: [id number] Date of birth: 04-25-1971 Age: 47 y.o. Gender: female  Primary Care Provider: Patient, No Pcp Per Consultants:Gen Surg; GI signed off Code Status:Full  Pt Overview and Major Events to Date: 02/23/2018 - Admitted for Gastric Outlet Obstruction 02/25/2018 - EGD 02/27/2018 - Laparoscopic Repair of Hiatal Hernia and Gastrostomy Tube  Assessment and Plan: [name] is a 47 year old female with history of Prader-Willi syndrome,mental retardation, sleep apnea, hypothyroid, abdominal hernia, hyperlipidemia, and bilateral lower extremity lymphedema with venous stasis presenting withsevere, generalizedabdominal pain radiating to her back.  Gastric Outlet Obstruction, improving: day5s/p laparoscopic diaphragmatic hernia repair, doing well.  - Surgery consulted, appreciate recs-slowly advancing diet - Pain control Tramadol 50 PO q6 PRN - continue PT/OT - d/c TPN (7/14), maintain full liquid diet  Prader-WilliSyndrome, chronic Patient hascognition of ~48-30 year-old.PWS comes with an insatiable appetite,however she has been restricted to a 1200 calorie diet and has a BMI in the healthy range.  - Strict diet control at 1,200 calories/day - Multivitamin and Calcium supplementation after PO restriction lifted  Unresponsive episode, resolved Patient with episode of unresponsiveness following fentanyl dose at 0459on 7/7. RRT arrived and gave patient narcan.Pain regimenhas since been decreased. - fentanyl q2hrs PRN Pain - monitor closely  Hypothyroidism, chronic Home meds:Levothyroxine qDaily PO before breakfast -Levothyroxine PO qAM  Hypertension, stable Well-controlled during admission. BP124/82. Home meds:Lasix 40mg  PO qDaily(likely also for venous stasis),Ramipril 10mg  capsule 10mg  PO qAM -holding BP  medications  HLD Home meds:d/cLovastatin 20mg  PO qHS--> Atorvastatin 40mg  PO qDaily -Lipid Panel with elevated LDL 113, otherwise WNL - Atorvastatin 40mg  PO qDaily   Anxiety Well-controlled with medications. Home meds:Buspirone 5mg  tablet PO BID,Citalopram 40mg  tablet PO qDaily - Hold Buspirone and Citalopram until NG tube removed - Can consider IV benzo if patient anxious while NPO  Sleep Apnea Patient uses CPAP at night; however, current NG tube complicates mask placement -may restartCPAP  FEN/GI:Full liquids JYN:WGNF, Lovenox, Famotidine PRN GERD  Disposition:d/c to Group Home pending clinical improvement  Subjective: Patient seen sitting upright in recliner, sipping coffee and milk without problems. She denies belly pain, nausea, vomiting, diarrhea, and constipation, and states she is doing well and looking forward to eating ice cream and mashed potatoes. She is ambulating frequently in the hallway and has been consuming clear liquids, coffee, and ice pops. She is passing gas.   Objective: Vitals:   03/04/18 2033 03/05/18 0547  BP: 122/78 114/65  Pulse: 96 82  Resp: 18   Temp: 97.8 F (36.6 C) 97.7 F (36.5 C)  SpO2: 100% 100%    Physical Exam: General: sitting in bedside chair, comfortable and talkative L. Central Line (7/10) Cardiovascular:Normal rate,regular rhythm,normal heart soundsand intact distal pulses.  Pulmonary/Chest:Breath sounds normal.  Abdominal:Normal bowel sounds in 4 quadrants, Binder in place, soft to palpation with minimal tenderness to palpation around G-tube. G-tube in upper L quadrant. Skin: Skin iswarmand dry. Capillary refill takesless than 2 seconds.  Laboratory: CBC Latest Ref Rng & Units 03/01/2018 02/28/2018 02/27/2018  WBC 4.0 - 10.5 K/uL 11.0(H) 13.3(H) 10.7(H)  Hemoglobin 12.0 - 15.0 g/dL 10.6(L) 11.8(L) 11.2(L)  Hematocrit 36.0 - 46.0 % 34.9(L) 37.8 36.5  Platelets 150 - 400 K/uL 188 255 238   CMP  Latest Ref Rng & Units 03/04/2018 03/02/2018 03/01/2018  Glucose 70 - 99 mg/dL 621(H) 086(V) 784(O)  BUN 6 - 20 mg/dL 11 9 6   Creatinine  0.44 - 1.00 mg/dL 0.450.45 4.090.48 8.110.64  Sodium 135 - 145 mmol/L 136 145 147(H)  Potassium 3.5 - 5.1 mmol/L 4.8 3.7 3.5  Chloride 98 - 111 mmol/L 107 111 114(H)  CO2 22 - 32 mmol/L 23 26 27   Calcium 8.9 - 10.3 mg/dL 8.0(L) 7.9(L) 7.9(L)  Total Protein 6.5 - 8.1 g/dL - - 5.0(L)  Total Bilirubin 0.3 - 1.2 mg/dL - - 0.5  Alkaline Phos 38 - 126 U/L - - 42  AST 15 - 41 U/L - - 15  ALT 0 - 44 U/L - - 15   Imaging/Diagnostic Tests: Ct Abdomen Pelvis W Contrast 02/23/2018 IMPRESSION: Stomach is markedly distended and fluid-filled with evidence of mass-effect on the antral region. This finding is related to herniation of omentum and transverse colon up into the anterior mediastinum, anterior to the heart. The herniated fat and colon generates substantial mass-effect on the distal stomach and proximal duodenum which occupy a high position in the anterior abdomen, in this mass-effect appears to result in a gastric outlet obstruction. There is no evidence of gastric wall thickening, colonic obstruction, or colonic wall thickening. A tiny amount of free fluid is identified in the anterior mediastinal hernia.  Dg Chest Port 1 View 02/27/2018 IMPRESSION: No postprocedure complication following left internal jugular venous catheter placement. Bilateral hypoinflation. Bibasilar subsegmental atelectasis   Dg Chest Portable 1 View 02/23/2018 IMPRESSION: Enteric tube tip is off the field of view but below the left hemidiaphragm. Shallow inspiration with linear atelectasis in the lung bases. Thoracolumbar scoliosis.   Dg Abd Portable 1v 02/24/2018 IMPRESSION: Nasogastric tube tip and side port in stomach. Overall paucity of gas. While this finding may be seen normally, it may also be indicative of enteritis or early ileus. Bowel obstruction not felt to be likely.     Barbara ShoalsHannah Anderson,  DO [facility], PGY-1 03/05/2018 9:59 AM FPTS Intern pager: [phone], text pages welcome

## 2018-03-04 NOTE — Progress Notes (Signed)
Per pharmacist order - Phillips Climeshuy Dang, changed TPN from 60 to 30 ml/hr

## 2018-03-05 ENCOUNTER — Encounter (HOSPITAL_COMMUNITY): Payer: Self-pay | Admitting: Surgery

## 2018-03-05 LAB — GLUCOSE, CAPILLARY: GLUCOSE-CAPILLARY: 116 mg/dL — AB (ref 70–99)

## 2018-03-05 NOTE — Progress Notes (Signed)
Patient has home unit CPAP at beside. Patient does not need any assistance in putting on mask. NO O2 bleed in needed.

## 2018-03-05 NOTE — Progress Notes (Signed)
6 Days Post-Op    CC: Abdominal pain with gastric outlet obstruction  Subjective: Patient is up in chair.  Her mother is with her.  She has been up walking.  She is tolerating the full liquids well.  She is hungry and would like to eat.  She had a bowel movement recorded yesterday.  Port sites all look good.  There is minimal drainage around the gastrostomy tube. Objective: Vital signs in last 24 hours: Temp:  [97.7 F (36.5 C)-97.8 F (36.6 C)] 97.7 F (36.5 C) (07/15 0547) Pulse Rate:  [82-96] 82 (07/15 0547) Resp:  [18] 18 (07/14 2033) BP: (114-122)/(65-78) 114/65 (07/15 0547) SpO2:  [100 %] 100 % (07/15 0547) Last BM Date: 03/03/18 600 PO 1450 urine Afebrile vital signs are stable Glucose 186 BMP otherwise normal Last WBC 7/10 CT abdomen pelvis with contrast 7/5; anterior diaphragmatic hernia with colon and omentum incarcerated in the hernia, adhesions of the omentum to the falciform ligament with a large stomach. Intake/Output from previous day: 07/14 0701 - 07/15 0700 In: 630 [P.O.:600] Out: 1450 [Urine:1450] Intake/Output this shift: Total I/O In: 240 [P.O.:240] Out: 300 [Urine:300]  General appearance: alert, cooperative and no distress Resp: clear to auscultation bilaterally and She is only moving 500 cc on the incentive spirometry.  We will continue to work on that. GI: Soft, sore, port sites all look good.  Gastrostomy site is clean there is a little bit of drainage on the dressing.  She has an abdominal binder in place.  Tolerating full liquids well.  Positive bowel function.  Lab Results:  No results for input(s): WBC, HGB, HCT, PLT in the last 72 hours.  BMET Recent Labs    03/04/18 0427  NA 136  K 4.8  CL 107  CO2 23  GLUCOSE 186*  BUN 11  CREATININE 0.45  CALCIUM 8.0*   PT/INR No results for input(s): LABPROT, INR in the last 72 hours.  Recent Labs  Lab 03/01/18 0413  AST 15  ALT 15  ALKPHOS 42  BILITOT 0.5  PROT 5.0*  ALBUMIN 2.3*      Lipase     Component Value Date/Time   LIPASE 26 02/23/2018 1609     Medications: . atorvastatin  40 mg Oral q1800  . enoxaparin (LOVENOX) injection  40 mg Subcutaneous Q24H  . famotidine  20 mg Oral BID  . levothyroxine  137 mcg Oral QAC breakfast   . sodium chloride 10 mL/hr at 03/04/18 1232  . lactated ringers Stopped (02/28/18 2315)   Anti-infectives (From admission, onward)   Start     Dose/Rate Route Frequency Ordered Stop   02/27/18 1200  cefoTEtan in Dextrose 5% (CEFOTAN) IVPB 2 g  Status:  Discontinued     2 g 100 mL/hr over 30 Minutes Intravenous  Once 02/27/18 1148 02/27/18 1230   02/27/18 1200  cefoTEtan in Dextrose 5% (CEFOTAN) IVPB 2 g  Status:  Discontinued     2 g 100 mL/hr over 30 Minutes Intravenous  Once 02/27/18 1152 02/27/18 1155   02/27/18 1200  cefoTEtan in Dextrose 5% (CEFOTAN) IVPB 2 g  Status:  Discontinued     2 g 100 mL/hr over 30 Minutes Intravenous  Once 02/27/18 1154 02/27/18 1156   02/27/18 1200  cefoTEtan (CEFOTAN) 2 g in sodium chloride 0.9 % 100 mL IVPB  Status:  Discontinued     2 g 200 mL/hr over 30 Minutes Intravenous To Surgery 02/27/18 1154 02/27/18 1523   02/27/18 1200  sodium  chloride 0.9 % 100 mL with cefoTEtan (CEFOTAN) 2 g infusion  Status:  Discontinued     100 mL/hr  Intravenous  Once 02/27/18 1155 02/27/18 1156      Assessment/Plan   Anterior diaphragmatic hernia with colon and omentum incarcerated in the hernia, adhesions of the omentum to the falciform ligament with a large stomach. Gastric outlet obstruction S/p laparoscopic repair anterior diaphragmatic hernia, gastrostomy tube 7/9 - Dr. Ezzard Standing POD 6  Prader-Willi Syndrome - developmental delay Hypothyroidism -levothyroid HTN HLD Anxiety Sleep apnea  FEN - full liquid diet IV saline lock VTE -SCDs, lovenox ID: Cefotetan preop Follow-up: Dr. Ovidio Kin  Plan: Going to advance her to a soft diet.  I will get nutrition to see her and help with her diet.   I will asked them to trim her binder, so it fits her better.  She is using plain Tylenol And tramadol for pain control.  Fentanyl has been discontinued. I will saline lock her IV.  If she tolerates a soft diet well we can start discharge planning.    LOS: 10 days    Barbara Ward 03/05/2018 [phone]

## 2018-03-05 NOTE — Progress Notes (Signed)
IV Team: Spoke with pt's nurse, Delia HeadyLuisa. She will discuss central line DC with MD. Pt has peripheral site in L forearm.

## 2018-03-05 NOTE — Progress Notes (Signed)
Physical Therapy Treatment Patient Details Name: Barbara Ward MRN: [id number] DOB: Apr 14, 1971 Today's Date: 03/05/2018    History of Present Illness Pt is a 47 y/o female admitted secondary to incresaed abdominal pain. Found to have hernia of mediastinum. PT is s/p EGD on 7/7 and s/p hernia repair on 7/9. PMH includes cognitive deficits, HTN, Prader Willi syndrome, and OSA on CPAP.    PT Comments    Pt performed gait training and functional mobility during session this am.  Pt has met all goals with excpetion on bed mobility due to being OOB in recliner on arrival.  Plan for progression of mobility to tolerance and upgrade of goals due to good progression with current goals. Will inform supervisiong PT of progress and need for upgraded goals.    Follow Up Recommendations  Home health PT;Supervision for mobility/OOB     Equipment Recommendations  Other (comment)(new rollator, currently rollator is not safe and has a broken brake on it.  )    Recommendations for Other Services       Precautions / Restrictions Precautions Precautions: Fall Precaution Comments: abdominal incision Required Braces or Orthoses: Other Brace/Splint Other Brace/Splint: abdominal binder Restrictions Weight Bearing Restrictions: No    Mobility  Bed Mobility               General bed mobility comments: Pt sitting in recliner on arrival.  Pt is able to scoot without assistance to the back of the chair.    Transfers Overall transfer level: Needs assistance Equipment used: Rolling walker (2 wheeled) Transfers: Sit to/from Stand Sit to Stand: Supervision         General transfer comment: Cues for safety.  No physical assistance needed.    Ambulation/Gait Ambulation/Gait assistance: Supervision Gait Distance (Feet): 400 Feet Assistive device: Rolling walker (2 wheeled) Gait Pattern/deviations: Step-through pattern;Decreased stride length;Trunk flexed     General Gait Details: No LOB,  Good  cadence and safety with deice.  Cues for upper trunk control and increasing stride length.     Stairs             Wheelchair Mobility    Modified Rankin (Stroke Patients Only)       Balance Overall balance assessment: Needs assistance Sitting-balance support: No upper extremity supported;Feet supported Sitting balance-[name]: Good       Standing balance-[name]: Poor                              Cognition Arousal/Alertness: Awake/alert Behavior During Therapy: WFL for tasks assessed/performed Overall Cognitive Status: History of cognitive impairments - at baseline                                        Exercises Total Joint Exercises Ankle Circles/Pumps: AROM;Both;10 reps;Supine Quad Sets: AROM;Both;10 reps;Supine Heel Slides: AROM;Both;10 reps;Supine Hip ABduction/ADduction: AROM;Both;10 reps;Supine General Exercises - Lower Extremity Long Arc Quad: AROM;Both;10 reps;Seated Hip Flexion/Marching: AROM;Both;10 reps;Seated    General Comments        Pertinent Vitals/Pain Pain Assessment: Faces Faces Pain Scale: Hurts a little bit Pain Location: abdominal incision  Pain Descriptors / Indicators: Sore;Discomfort Pain Intervention(s): Monitored during session;Repositioned;Premedicated before session    Home Living                      Prior Function  PT Goals (current goals can now be found in the care plan section) Acute Rehab PT Goals Patient Stated Goal: to walk  Potential to Achieve Goals: Good Progress towards PT goals: Progressing toward goals    Frequency    Min 3X/week      PT Plan Current plan remains appropriate    Co-evaluation              AM-PAC PT "6 Clicks" Daily Activity  Outcome Measure  Difficulty turning over in bed (including adjusting bedclothes, sheets and blankets)?: A Lot Difficulty moving from lying on back to sitting on the side of the bed? :  Unable Difficulty sitting down on and standing up from a chair with arms (e.g., wheelchair, bedside commode, etc,.)?: Unable Help needed moving to and from a bed to chair (including a wheelchair)?: A Little Help needed walking in hospital room?: A Little Help needed climbing 3-5 steps with a railing? : A Little 6 Click Score: 13    End of Session Equipment Utilized During Treatment: Gait belt Activity Tolerance: Patient tolerated treatment well Patient left: in chair;with call bell/phone within reach;with family/visitor present Nurse Communication: Mobility status PT Visit Diagnosis: Other abnormalities of gait and mobility (R26.89);Unsteadiness on feet (R26.81);Muscle weakness (generalized) (M62.81);Pain Pain - part of body: (abdomen)     Time: 4492-0100 PT Time Calculation (min) (ACUTE ONLY): 21 min  Charges:  $Gait Training: 8-22 mins                    G Codes:       Governor Rooks, PTA pager [phone]    Cristela Blue 03/05/2018, 12:53 PM

## 2018-03-05 NOTE — Progress Notes (Addendum)
Family Medicine Teaching Service Daily Progress Note Intern Pager: [phone]  Patient name: Barbara Ward L Heidecker Medical record number: [id number] Date of birth: [birth year] Age: 47 y.o. Gender: female  Primary Care Provider: Patient, No Pcp Per Consultants:Gen Surg; GI signed off Code Status:Full  Pt Overview and Major Events to Date: 02/23/2018 -Admitted for Gastric Outlet Obstruction 02/25/2018 -EGD 02/27/2018 -Laparoscopic Repair of Hiatal Hernia and Gastrostomy Tube  Assessment and Plan: Barbara Ward is a 47 year old female with history of Prader-Willi syndrome,mental retardation, sleep apnea, hypothyroid, abdominal hernia, hyperlipidemia, and bilateral lower extremity lymphedema with venous stasis presenting withsevere, generalizedabdominal pain radiating to her back.  Gastric Outlet Obstruction, improving: day5s/p laparoscopic diaphragmatic hernia repair, doing well.  - Surgery consulted, appreciate recs-slowly advancing diet - Pain controlTramadol 50 PO q6 PRN -continue PT/OT -d/c TPN (7/14) - Soft Diet (7/16)  Prader-WilliSyndrome, chronic Patient hascognition of ~77-45 year-old.PWS comes with an insatiable appetite,however she has been restricted to a 1200 calorie diet and has a BMI in the healthy range.  - Strict diet control at 1,200 calories/day - Multivitamin and Calcium supplementation after PO restriction lifted  Unresponsive episode, resolved Patient with episode of unresponsiveness following fentanyl dose at 0459on 7/7. RRT arrived and gave patient narcan.Pain regimenhas since been decreased. - d/c fentanyl 25mcg q2hrs - monitor closely  Hypothyroidism, chronic Home meds:Levothyroxine 137mcg qDaily PO before breakfast -Levothyroxine 137mcg PO qAM  Hypertension, stable Well-controlled during admission. BP124/82. Home meds:Lasix 40mg  PO qDaily(likely also for venous stasis),Ramipril 10mg  capsule 10mg  PO qAM -holding BP  medications  HLD Home meds:d/cLovastatin 20mg  PO qHS--> Atorvastatin 40mg  PO qDaily -Lipid Panel with elevated LDL 113, otherwise WNL - Atorvastatin 40mg  PO qDaily   Anxiety Well-controlled with medications. Home meds:Buspirone 5mg  tablet PO BID,Citalopram 40mg  tablet PO qDaily - Hold Buspirone and Citalopram until NG tube removed - Can consider IV benzo if patient anxious while NPO  Sleep Apnea Patient uses CPAP at night; however, current NG tube complicates mask placement -may restartCPAP  FEN/GI:Full liquids JYN:WGNFPPx:SCDs, Lovenox, Famotidine PRN GERD  Disposition:[facility] improvement  Subjective: Patient seen relaxing in recliner.  Excited to start eating soft foods today.  She denies belly pain,nausea, vomiting, diarrhea, and constipation,and states she is doing well and looking forward to eating ice cream and mashed potatoes.She is ambulating frequently in the hallway and has beenconsuming clear liquids, coffee,andice pops. She is passing gas.  Objective: Vitals:   03/05/18 2056 03/06/18 0640  BP: 121/74 137/74  Pulse: 91 95  Resp: 18 17  Temp: 98.5 F (36.9 C) 98.1 F (36.7 C)  SpO2: 97% 99%     Physical Exam: General: sitting in bedside chair, comfortable and talkative L. [place] (7/10) Cardiovascular:Normal rate,regular rhythm,normal heart soundsand intact distal pulses.  Pulmonary/Chest:Breath sounds normal.  Abdominal:Normal bowel sounds in 4 quadrants,Binder in place,softto palpation with minimal tenderness to palpation around G-tube. G-tube in upper L quadrant. Skin: Skin iswarmand dry. Capillary refill takesless than 2 seconds.  Laboratory: CBC Latest Ref Rng & Units 03/01/2018 02/28/2018 02/27/2018  WBC 4.0 - 10.5 K/uL 11.0(H) 13.3(H) 10.7(H)  Hemoglobin 12.0 - 15.0 g/dL 10.6(L) 11.8(L) 11.2(L)  Hematocrit 36.0 - 46.0 % 34.9(L) 37.8 36.5  Platelets 150 - 400 K/uL 188 255 238   CMP Latest  Ref Rng & Units 03/04/2018 03/02/2018 03/01/2018  Glucose 70 - 99 mg/dL 621(H186(H) 086(V127(H) 784(O117(H)  BUN 6 - 20 mg/dL 11 9 6   Creatinine 0.44 - 1.00 mg/dL 9.620.45 9.520.48 8.410.64  Sodium 135 - 145 mmol/L 136 145  147(H)  Potassium 3.5 - 5.1 mmol/L 4.8 3.7 3.5  Chloride 98 - 111 mmol/L 107 111 114(H)  CO2 22 - 32 mmol/L 23 26 27   Calcium 8.9 - 10.3 mg/dL 8.0(L) 7.9(L) 7.9(L)  Total Protein 6.5 - 8.1 g/dL - - 5.0(L)  Total Bilirubin 0.3 - 1.2 mg/dL - - 0.5  Alkaline Phos 38 - 126 U/L - - 42  AST 15 - 41 U/L - - 15  ALT 0 - 44 U/L - - 15   Imaging/Diagnostic Tests: Ct Abdomen Pelvis W Contrast 02/23/2018 IMPRESSION: Stomach is markedly distended and fluid-filled with evidence of mass-effect on the antral region. This finding is related to herniation of omentum and transverse colon up into the anterior mediastinum, anterior to the heart. The herniated fat and colon generates substantial mass-effect on the distal stomach and proximal duodenum which occupy a high position in the anterior abdomen, in this mass-effect appears to result in a gastric outlet obstruction. There is no evidence of gastric wall thickening, colonic obstruction, or colonic wall thickening. A tiny amount of free fluid is identified in the anterior mediastinal hernia.  Dg Chest Port 1 View 02/27/2018 IMPRESSION: No postprocedure complication following left internal jugular venous catheter placement. Bilateral hypoinflation. Bibasilar subsegmental atelectasis   Dg Chest Portable 1 View 02/23/2018 IMPRESSION: Enteric tube tip is off the field of view but below the left hemidiaphragm. Shallow inspiration with linear atelectasis in the lung bases. Thoracolumbar scoliosis.   Dg Abd Portable 1v 02/24/2018 IMPRESSION: Nasogastric tube tip and side port in stomach. Overall paucity of gas. While this finding may be seen normally, it may also be indicative of enteritis or early ileus. Bowel obstruction not felt to be likely.    Barbara Shoals, DO [facility] Family Medicine, PGY-1 03/06/2018 6:49 AM FPTS Intern pager: [phone], text pages welcome

## 2018-03-06 MED ORDER — PRO-STAT SUGAR FREE PO LIQD
30.0000 mL | Freq: Two times a day (BID) | ORAL | Status: DC
  Administered 2018-03-06: 30 mL via ORAL
  Filled 2018-03-06: qty 30

## 2018-03-06 MED ORDER — ATORVASTATIN CALCIUM 40 MG PO TABS: 40.0000 mg | ORAL_TABLET | Freq: Every day | ORAL | 0 refills | Status: AC

## 2018-03-06 NOTE — Discharge Instructions (Signed)
Hiatal Hernia A hiatal hernia occurs when part of the stomach slides above the muscle that separates the abdomen from the chest (diaphragm). A person can be born with a hiatal hernia (congenital), or it may develop over time. In almost all cases of hiatal hernia, only the top part of the stomach pushes through the diaphragm. Many people have a hiatal hernia with no symptoms. The larger the hernia, the more likely it is that you will have symptoms. In some cases, a hiatal hernia allows stomach acid to flow back into the tube that carries food from your mouth to your stomach (esophagus). This may cause heartburn symptoms. Severe heartburn symptoms may mean that you have developed a condition called gastroesophageal reflux disease (GERD). What are the causes? This condition is caused by a weakness in the opening (hiatus) where the esophagus passes through the diaphragm to attach to the upper part of the stomach. A person may be born with a weakness in the hiatus, or a weakness can develop over time. What increases the risk? This condition is more likely to develop in:  Older people. Age is a major risk factor for a hiatal hernia, especially if you are over the age of 9.  Pregnant women.  People who are overweight.  People who have frequent constipation.  What are the signs or symptoms? Symptoms of this condition usually develop in the form of GERD symptoms. Symptoms include:  Heartburn.  Belching.  Indigestion.  Trouble swallowing.  Coughing or wheezing.  Sore throat.  Hoarseness.  Chest pain.  Nausea and vomiting.  How is this diagnosed? This condition may be diagnosed during testing for GERD. Tests that may be done include:  X-rays of your stomach or chest.  An upper gastrointestinal (GI) series. This is an X-ray exam of your GI tract that is taken after you swallow a chalky liquid that shows up clearly on the X-ray.  Endoscopy. This is a procedure to look into your  stomach using a thin, flexible tube that has a tiny camera and light on the end of it.  How is this treated? This condition may be treated by:  Dietary and lifestyle changes to help reduce GERD symptoms.  Medicines. These may include: ? Over-the-counter antacids. ? Medicines that make your stomach empty more quickly. ? Medicines that block the production of stomach acid (H2 blockers). ? Stronger medicines to reduce stomach acid (proton pump inhibitors).  Surgery to repair the hernia, if other treatments are not helping.  If you have no symptoms, you may not need treatment. Follow these instructions at home: Lifestyle and activity  Do not use any products that contain nicotine or tobacco, such as cigarettes and e-cigarettes. If you need help quitting, ask your health care provider.  Try to achieve and maintain a healthy body weight.  Avoid putting pressure on your abdomen. Anything that puts pressure on your abdomen increases the amount of acid that may be pushed up into your esophagus. ? Avoid bending over, especially after eating. ? Raise the head of your bed by putting blocks under the legs. This keeps your head and esophagus higher than your stomach. ? Do not wear tight clothing around your chest or stomach. ? Try not to strain when having a bowel movement, when urinating, or when lifting heavy objects. Eating and drinking  Avoid foods that can worsen GERD symptoms. These may include: ? Fatty foods, like fried foods. ? Citrus fruits, like oranges or lemon. ? Other foods and drinks that  contain acid, like orange juice or tomatoes. ? Spicy food. ? Chocolate.  Eat frequent small meals instead of three large meals a day. This helps prevent your stomach from getting too full. ? Eat slowly. ? Do not lie down right after eating. ? Do not eat 1-2 hours before bed.  Do not drink beverages with caffeine. These include cola, coffee, cocoa, and tea.  Do not drink alcohol. General  instructions  Take over-the-counter and prescription medicines only as told by your health care provider.  Keep all follow-up visits as told by your health care provider. This is important. Contact a health care provider if:  Your symptoms are not controlled with medicines or lifestyle changes.  You are having trouble swallowing.  You have coughing or wheezing that will not go away. Get help right away if:  Your pain is getting worse.  Your pain spreads to your arms, neck, jaw, teeth, or back.  You have shortness of breath.  You sweat for no reason.  You feel sick to your stomach (nauseous) or you vomit.  You vomit blood.  You have bright red blood in your stools.  You have black, tarry stools. This information is not intended to replace advice given to you by your health care provider. Make sure you discuss any questions you have with your health care provider. Document Released: 10/29/2003 Document Revised: 08/01/2016 Document Reviewed: 08/01/2016 Elsevier Interactive Patient Education  2018 Elsevier Inc[employer]   Gastrostomy Tube Home Guide, Adult A gastrostomy tube is a tube that is surgically placed into the stomach. It is also called a G-tube. G-tubes are used when a person is unable to eat and drink enough on their own to stay healthy. The tube is inserted into the stomach through a small cut (incision) in the skin. This tube is used for:  Feeding.  Giving medication.  Gastrostomy tube care  Wash your hands with soap and water.  Remove the old dressing (if any). Some styles of G-tubes may need a dressing inserted between the skin and the G-tube. Other types of G-tubes do not require a dressing. Ask your health care provider if a dressing is needed.  Check the area where the tube enters the skin (insertion site) for redness, swelling, or pus-like (purulent) drainage. A small amount of clear or tan liquid drainage is normal. Check to make sure scar tissue (skin) is not  growing around the insertion site. This could have a raised, bumpy appearance.  A cotton swab can be used to clean the skin around the tube: ? When the G-tube is first put in, a normal saline solution or water can be used to clean the skin. ? Mild soap and warm water can be used when the skin around the G-tube site has healed. ? Roll the cotton swab around the G-tube insertion site to remove any drainage or crusting at the insertion site. Stomach residuals Feeding tube residuals are the amount of liquids that are in the stomach at any given time. Residuals may be checked before giving feedings, medications, or as instructed by your health care provider.  Ask your health care provider if there are instances when you would not start tube feedings depending on the amount or type of contents withdrawn from the stomach.  Check residuals by attaching a syringe to the G-tube and pulling back on the syringe plunger. Note the amount, and return the residual back into the stomach.  Flushing the G-tube  The G-tube should be periodically flushed with  clean warm water to keep it from clogging. ? Flush the G-tube after feedings or medications. Draw up 30 mL of warm water in a syringe. Connect the syringe to the G-tube and slowly push the water into the tube. ? Do not push feedings, medications, or flushes rapidly. Flush the G-tube gently and slowly. ? Only use syringes made for G-tubes to flush medications or feedings. ? Your health care provider may want the G-tube flushed more often or with more water. If this is the case, follow your health care provider's instructions. Feedings Your health care provider will determine whether feedings are given as a bolus (a certain amount given at one time and at scheduled times) or whether feedings will be given continuously on a feeding pump.  Formulas should be given at room temperature.  If feedings are continuous, no more than 4 hours worth of feedings should be  placed in the feeding bag. This helps prevent spoilage or accidental excess infusion.  Cover and place unused formula in the refrigerator.  If feedings are continuous, stop the feedings when medications or flushes are given. Be sure to restart the feedings.  Feeding bags and syringes should be replaced as instructed by your health care provider.  Giving medication  In general, it is best if all medications are in a liquid form for G-tube administration. Liquid medications are less likely to clog the G-tube. ? Mix the liquid medication with 30 mL (or amount recommended by your health care provider) of warm water. ? Draw up the medication into the syringe. ? Attach the syringe to the G-tube and slowly push the mixture into the G-tube. ? After giving the medication, draw up 30 mL of warm water in the syringe and slowly flush the G-tube.  For pills or capsules, check with your health care provider first before crushing medications. Some pills are not effective if they are crushed. Some capsules are sustained-release medications. ? If appropriate, crush the pill or capsule and mix with 30 mL of warm water. Using the syringe, slowly push the medication through the tube, then flush the tube with another 30 mL of tap water. G-tube problems G-tube was pulled out.  Cause: May have been pulled out accidentally.  Solutions: Cover the opening with clean dressing and tape. Call your health care provider right away. The G-tube should be put in as soon as possible (within 4 hours) so the G-tube opening (tract) does not close. The G-tube needs to be put in at a health care setting. An X-ray needs to be done to confirm placement before the G-tube can be used again.  Redness, irritation, soreness, or foul odor around the gastrostomy site.  Cause: May be caused by leakage or infection.  Solutions: Call your health care provider right away.  Large amount of leakage of fluid or mucus-like liquid present (a  large amount means it soaks clothing).  Cause: Many reasons could cause the G-tube to leak.  Solutions: Call your health care provider to discuss the amount of leakage.  Skin or scar tissue appears to be growing where tube enters skin.  Cause: Tissue growth may develop around the insertion site if the G-tube is moved or pulled on excessively.  Solutions: Secure tube with tape so that excess movement does not occur. Call your health care provider.  G-tube is clogged.  Cause: Thick formula or medication.  Solutions: Try to slowly push warm water into the tube with a large syringe. Never try to push any object  into the tube to unclog it. Do not force fluid into the G-tube. If you are unable to unclog the tube, call your health care provider right away.  Tips  Head of bed (HOB) position refers to the upright position of a person's upper body. ? When giving medications or a feeding bolus, keep the [facility] up as told by your health care provider. Do this during the feeding and for 1 hour after the feeding or medication administration. ? If continuous feedings are being given, it is best to keep the [facility] up as told by your health care provider. When ADLs (activities of daily living) are performed and the [facility] needs to be flat, be sure to turn the feeding pump off. Restart the feeding pump when the [facility] is returned to the recommended height.  Do not pull or put tension on the tube.  To prevent fluid backflow, kink the G-tube before removing the cap or disconnecting a syringe.  Check the G-tube length every day. Measure from the insertion site to the end of the G-tube. If the length is longer than previous measurements, the tube may be coming out. Call your health care provider if you notice increasing G-tube length.  Oral care, such as brushing teeth, must be continued.  You may need to remove excess air (vent) from the G-tube. Your health care provider will tell you if this is needed.  Always  call your health care provider if you have questions or problems with the G-tube. Get help right away if:  You have severe abdominal pain, tenderness, or abdominal bloating (distension).  You have nausea or vomiting.  You are constipated or have problems moving your bowels.  The G-tube insertion site is red, swollen, has a foul smell, or has yellow or brown drainage.  You have difficulty breathing or shortness of breath.  You have a fever.  You have a large amount of feeding tube residuals.  The G-tube is clogged and cannot be flushed. This information is not intended to replace advice given to you by your health care provider. Make sure you discuss any questions you have with your health care provider. Document Released: 10/17/2001 Document Revised: 01/14/2016 Document Reviewed: 04/15/2013 Elsevier Interactive Patient Education  2017 Elsevier Inc. [facility] _____[facility] LAPAROSCOPIC SURGERY: POST OP INSTRUCTIONS Always review your discharge instruction sheet given to you by the facility where your surgery was performed. IF YOU HAVE DISABILITY OR FAMILY LEAVE FORMS, YOU MUST BRING THEM TO THE OFFICE FOR PROCESSING.   DO NOT GIVE THEM TO YOUR DOCTOR.  1. A prescription for pain medication may be given to you upon discharge.  Take your pain medication as prescribed, if needed.  If narcotic pain medicine is not needed, then you may take acetaminophen (Tylenol) or ibuprofen (Advil) as needed. 2. Take your usually prescribed medications unless otherwise directed. 3. If you need a refill on your pain medication, please contact your pharmacy.  They will contact our office to request authorization. Prescriptions will not be filled after 5pm or on week-ends. 4. You should follow a light diet the first few days after arrival home, such as soup and crackers, etc.  Be sure to include lots of fluids daily. 5. Most patients will experience some swelling and bruising in the area of  the incisions.  Ice packs will help.  Swelling and bruising can take several days to resolve.  6. It is common to experience some constipation if taking pain medication after surgery.  Increasing  fluid intake and taking a stool softener (such as Colace) will usually help or prevent this problem from occurring.  A mild laxative (Milk of Magnesia or Miralax) should be taken according to package instructions if there are no bowel movements after 48 hours. 7. Unless discharge instructions indicate otherwise, you may remove your bandages 24-48 hours after surgery, and you may shower at that time.  You may have steri-strips (small skin tapes) in place directly over the incision.  These strips should be left on the skin for 7-10 days.  If your surgeon used skin glue on the incision, you may shower in 24 hours.  The glue will flake off over the next 2-3 weeks.  Any sutures or staples will be removed at the office during your follow-up visit. 8. ACTIVITIES:  You may resume regular (light) daily activities beginning the next day--such as daily self-care, walking, climbing stairs--gradually increasing activities as tolerated.  You may have sexual intercourse when it is comfortable.  Refrain from any heavy lifting or straining until approved by your doctor. a. You may drive when you are no longer taking prescription pain medication, you can comfortably wear a seatbelt, and you can safely maneuver your car and apply brakes. b. RETURN TO WORK:  __________________________________________________________ 9. You should see your doctor in the office for a follow-up appointment approximately 2-3 weeks after your surgery.  Make sure that you call for this appointment within a day or two after you arrive home to insure a convenient appointment time. 10. OTHER INSTRUCTIONS: __________________________________________________________________________________________________________________________  __________________________________________________________________________________________________________________________ WHEN TO CALL YOUR DOCTOR: 1. Fever over 101.0 2. Inability to urinate 3. Continued bleeding from incision. 4. Increased pain, redness, or drainage from the incision. 5. Increasing abdominal pain  The clinic staff is available to answer your questions during regular business hours.  Please dont hesitate to call and ask to speak to one of the nurses for clinical concerns.  If you have a medical emergency, go to the nearest emergency room or call 911.  A surgeon from [facility] is always on call at the hospital. [address], [place], [place]  [address] ? [address] [address], [place], [place]   [address] [phone] ? [phone] ? FAX [phone] Web site: [email or web address]

## 2018-03-06 NOTE — Progress Notes (Signed)
Nutrition Follow-up  DOCUMENTATION CODES:   Morbid obesity  INTERVENTION:   - Pro-stat 30 mL BID, each provides 100 kcal and 15 grams protein  - Discussed importance of protein intake in wound healing  NUTRITION DIAGNOSIS:   Inadequate oral intake related to altered GI function as evidenced by NPO status.  Progressing as pt is now on Soft diet  GOAL:   Patient will meet greater than or equal to 90% of their needs  Progressing  MONITOR:   Supplement acceptance, Diet advancement, Weight trends, PO intake, Skin, I & O's, Vent status  REASON FOR ASSESSMENT:   Consult Assessment of nutrition requirement/status  ASSESSMENT:   Barbara Ward is a 47 y.o. female presenting with severe abdominal pain 2/2 diaphragmatic and anterior mediastinal herniation. PMH is significant for pedal edema 2/2 venous insufficiency and Prader-Willi Syndrome.  7/5 - CT scan revealed dilated stomach withtransverse colon and omentum herniating through the diaphragm into the anterior mediastinum suggesting that this was coming across the distal stomach/duodenum creating the obstruction; NGT placed for decompression 7/7 - s/p EGD that revealed large amount of residue in stomach as well as narrowing of prepyloric stomach as well as pyloric channel and duodenal bulb 7/9 - s/p lap repair of anterior diaphragmatic hernia, gastrostomy placement 7/10 - PICC line placed, TPN initiated 7/12 - advanced to clear liquid diet 7/14 - TPN d/c, advanced to full liquid diet 7/15 - advanced to soft diet  Spoke with RN and Surgery PA. Pt likely to discharge today. Per RN, pt has had some popsicles for snacks in between meals.  Pt with breakfast meal tray at time of visit. Pt states she is enjoying eating her breakfast and is glad to be able to eat food again. Pt reports she is tolerating her breakfast well.  Discussed importance of adequate protein intake for wound healing with pt and mother. Pt agreeable to trying  Pro-stat protein supplement prior to discharge. Encouraged adequate protein intake after discharge as well. Noted pt likely to return to group home at discharge.  Pt consumed 100% of breakfast meal tray.  Medications reviewed and include: 20 mg Pepcid BID, 137 mcg levothyroxine daily  Labs reviewed.  I/O's: +9.2 L since admission   Diet Order:   Diet Order           DIET SOFT Room service appropriate? Yes; Fluid consistency: Thin  Diet effective now          EDUCATION NEEDS:   Education needs have been addressed  Skin:  Skin Assessment: Skin Integrity Issues: Skin Integrity Issues:: Incisions Incisions: closed abdomen  Last BM:  03/05/18 small type 7  Height:   Ht Readings from Last 1 Encounters:  02/28/18 4\' 6"  (1.372 m)    Weight:   Wt Readings from Last 1 Encounters:  02/28/18 175 lb (79.4 kg)    Ideal Body Weight:  40.9 kg  BMI:  Body mass index is 42.19 kg/m.  Estimated Nutritional Needs:   Kcal:  1250-1450 kcal/day  Protein:  80-95 grams/day  Fluid:  > 1.2 L/day    Earma ReadingKate Jablonski Arelly Whittenberg, MS, RD, LDN Pager: [phone] Weekend/After Hours: [phone]

## 2018-03-06 NOTE — Progress Notes (Addendum)
Discharge instructions reviewed with the Pt, mother and group home personal. Pt discharged back to group home. Pt to have H/H to assist with care to Gtube and other needs. Pt left with group home via wheelchair

## 2018-03-06 NOTE — Progress Notes (Signed)
7 Days Post-Op    CC: Abdominal pain with gastric outlet obstruction  Subjective: She is feeling quite good this a.m. and eating everything that they will bring her.  She is also having multiple loose stools.  Her mom tells me the current plan is to go back to the group home where she lives.  The dietitian is here to help with her nutritional needs.  Her port sites all look good and gastrostomy tube looks fine.  Objective: Vital signs in last 24 hours: Temp:  [98.1 F (36.7 C)-98.6 F (37 C)] 98.1 F (36.7 C) (07/16 0640) Pulse Rate:  [79-95] 95 (07/16 0640) Resp:  [17-20] 17 (07/16 0640) BP: (121-143)/(74-79) 137/74 (07/16 0640) SpO2:  [94 %-99 %] 99 % (07/16 0640) Last BM Date: 03/03/18 480 PO recorded 300 urine recorded Afebrile, VSS Labs OK, Glucose 186   Intake/Output from previous day: 07/15 0701 - 07/16 0700 In: 480 [P.O.:480] Out: 300 [Urine:300] Intake/Output this shift: No intake/output data recorded.  General appearance: alert, cooperative and no distress Resp: clear to auscultation bilaterally GI: Soft, minimal soreness, sites all look good.  She is having multiple loose stools.  Lab Results:  No results for input(s): WBC, HGB, HCT, PLT in the last 72 hours.  BMET Recent Labs    03/04/18 0427  NA 136  K 4.8  CL 107  CO2 23  GLUCOSE 186*  BUN 11  CREATININE 0.45  CALCIUM 8.0*   PT/INR No results for input(s): LABPROT, INR in the last 72 hours.  Recent Labs  Lab 03/01/18 0413  AST 15  ALT 15  ALKPHOS 42  BILITOT 0.5  PROT 5.0*  ALBUMIN 2.3*     Lipase     Component Value Date/Time   LIPASE 26 02/23/2018 1609     Medications: . atorvastatin  40 mg Oral q1800  . enoxaparin (LOVENOX) injection  40 mg Subcutaneous Q24H  . famotidine  20 mg Oral BID  . 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 Gastric outlet obstruction S/p laparoscopic repair anterior diaphragmatic hernia, gastrostomy tube 7/9-Dr. Ezzard StandingNewman POD 7  Prader-Willi Syndrome - developmental delay Hypothyroidism -levothyroid HTN HLD Anxiety Sleep apnea - CPAP  FEN - soft diet VTE -SCDs, lovenox ID: Cefotetan preop Follow-up: Dr. Ovidio Kinavid Newman - follow up is in the AVS  Plan: From our standpoint the patient can be discharged home.  Discharge instructions and routine gastrostomy tube care is listed in the discharge instructions.  Our office is working on a follow-up appointment with Dr. Ezzard StandingNewman.  Our office will call and provide the family with follow-up appointment.       LOS: 11 days    Tylor Gambrill 03/06/2018 [phone]

## 2018-03-06 NOTE — Progress Notes (Signed)
Occupational Therapy Treatment Patient Details Name: Barbara Ward L Nyland MRN: [id number] DOB: August 11, 1971 Today's Date: 03/06/2018    History of present illness Pt is a 47 y/o female admitted secondary to incresaed abdominal pain. Found to have hernia of mediastinum. PT is s/p EGD on 7/7 and s/p hernia repair on 7/9. PMH includes cognitive deficits, HTN, Prader Willi syndrome, and OSA on CPAP.   OT comments  Pt making good progress with functional goals. Pt stood at sink for grooming and oral care with cues for safety and increased time to complete. Pt completed UB dressing standing at RW. Pt to possibly d/c home today and pt's mother will be assisting pt with LB ADLs until pt able to resume. Pt very pleasant and cooperative. OT will continue to follow acutely  Follow Up Recommendations  Home health OT;Supervision/Assistance - 24 hour    Equipment Recommendations  Other (comment)(reacher)    Recommendations for Other Services      Precautions / Restrictions Precautions Precautions: Fall Precaution Comments: abdominal incision Required Braces or Orthoses: Other Brace/Splint Other Brace/Splint: abdominal binder Restrictions Weight Bearing Restrictions: No       Mobility Bed Mobility               General bed mobility comments: pt up with NT ambulating from bathroom upon OT arrival  Transfers Overall transfer level: Needs assistance Equipment used: Rolling walker (2 wheeled) Transfers: Sit to/from Stand Sit to Stand: Supervision Stand pivot transfers: Supervision       General transfer comment: Cues for safety    Balance Overall balance assessment: Needs assistance Sitting-balance support: No upper extremity supported;Feet supported Sitting balance-[name]: Good     Standing balance support: During functional activity;Single extremity supported Standing balance-[name]: Fair                             ADL either performed or assessed with  clinical judgement   ADL Overall ADL's : Needs assistance/impaired     Grooming: Wash/dry hands;Wash/dry face;Oral care;Standing Grooming Details (indicate cue type and reason): pt stood at sink         Upper Body Dressing : Set up;Supervision/safety;With caregiver independent assisting;Standing       Toilet Transfer: Ambulation;RW;Supervision/safety;With caregiver independent assisting   Toileting- ArchitectClothing Manipulation and Hygiene: Min guard;Sit to/from stand;Cueing for sequencing;With caregiver independent assisting       Functional mobility during ADLs: Rolling walker;Supervision/safety;Cueing for safety;Caregiver able to provide necessary level of assistance General ADL Comments: increased time required for all tasks to complete,  pt's mother reports she will assist  pt able to resume     Vision Baseline Vision/History: Wears glasses Wears Glasses: At all times Patient Visual Report: No change from baseline     Perception     Praxis      Cognition Arousal/Alertness: Awake/alert Behavior During Therapy: WFL for tasks assessed/performed Overall Cognitive Status: History of cognitive impairments - at baseline                                          Exercises     Shoulder Instructions       General Comments      Pertinent Vitals/ Pain       Pain Assessment: Faces Faces Pain Scale: Hurts a little bit Pain Location: abdominal incision  Pain Descriptors / Indicators: Sore;Discomfort;Grimacing  Pain Intervention(s): Monitored during session;RN gave pain meds during session;Repositioned  Home Living                                          Prior Functioning/Environment              Frequency  Min 2X/week        Progress Toward Goals  OT Goals(current goals can now be found in the care plan section)  Progress towards OT goals: Progressing toward goals     Plan Discharge plan remains appropriate;Frequency  remains appropriate    Co-evaluation                 AM-PAC PT "6 Clicks" Daily Activity     Outcome Measure   Help from another person eating meals?: None Help from another person taking care of personal grooming?: A Little Help from another person toileting, which includes using toliet, bedpan, or urinal?: A Little Help from another person bathing (including washing, rinsing, drying)?: A Lot Help from another person to put on and taking off regular upper body clothing?: A Little Help from another person to put on and taking off regular lower body clothing?: A Lot 6 Click Score: 17    End of Session Equipment Utilized During Treatment: Gait belt;Rolling walker  OT Visit Diagnosis: Muscle weakness (generalized) (M62.81);Pain;Unsteadiness on feet (R26.81) Pain - part of body: (abdomen)   Activity Tolerance Patient tolerated treatment well   Patient Left in chair;with call bell/phone within reach;with family/visitor present   Nurse Communication      Functional Assessment Tool Used: AM-PAC 6 Clicks Daily Activity   Time: 1610-9604 OT Time Calculation (min): 36 min  Charges: OT G-codes **NOT FOR INPATIENT CLASS** Functional Assessment Tool Used: AM-PAC 6 Clicks Daily Activity OT General Charges $OT Visit: 1 Visit OT Treatments $Self Care/Home Management : 8-22 mins $Therapeutic Activity: 8-22 mins     Galen Manila 03/06/2018, 12:52 PM

## 2018-03-07 DIAGNOSIS — G4730 Sleep apnea, unspecified: Secondary | ICD-10-CM | POA: Diagnosis not present

## 2018-03-07 DIAGNOSIS — J9859 Other diseases of mediastinum, not elsewhere classified: Secondary | ICD-10-CM | POA: Diagnosis not present

## 2018-03-07 DIAGNOSIS — E039 Hypothyroidism, unspecified: Secondary | ICD-10-CM | POA: Diagnosis not present

## 2018-03-07 DIAGNOSIS — Z431 Encounter for attention to gastrostomy: Secondary | ICD-10-CM | POA: Diagnosis not present

## 2018-03-07 DIAGNOSIS — I10 Essential (primary) hypertension: Secondary | ICD-10-CM | POA: Diagnosis not present

## 2018-03-07 DIAGNOSIS — Q871 Congenital malformation syndromes predominantly associated with short stature: Secondary | ICD-10-CM | POA: Diagnosis not present

## 2018-03-07 DIAGNOSIS — F419 Anxiety disorder, unspecified: Secondary | ICD-10-CM | POA: Diagnosis not present

## 2018-03-07 DIAGNOSIS — E785 Hyperlipidemia, unspecified: Secondary | ICD-10-CM | POA: Diagnosis not present

## 2018-03-07 DIAGNOSIS — F79 Unspecified intellectual disabilities: Secondary | ICD-10-CM | POA: Diagnosis not present

## 2018-03-07 DIAGNOSIS — K311 Adult hypertrophic pyloric stenosis: Secondary | ICD-10-CM | POA: Diagnosis not present

## 2018-03-07 DIAGNOSIS — Z79899 Other long term (current) drug therapy: Secondary | ICD-10-CM | POA: Diagnosis not present

## 2018-03-08 DIAGNOSIS — K311 Adult hypertrophic pyloric stenosis: Secondary | ICD-10-CM | POA: Diagnosis not present

## 2018-03-08 DIAGNOSIS — F79 Unspecified intellectual disabilities: Secondary | ICD-10-CM | POA: Diagnosis not present

## 2018-03-08 DIAGNOSIS — I10 Essential (primary) hypertension: Secondary | ICD-10-CM | POA: Diagnosis not present

## 2018-03-08 DIAGNOSIS — J9859 Other diseases of mediastinum, not elsewhere classified: Secondary | ICD-10-CM | POA: Diagnosis not present

## 2018-03-08 DIAGNOSIS — Q871 Congenital malformation syndromes predominantly associated with short stature: Secondary | ICD-10-CM | POA: Diagnosis not present

## 2018-03-08 DIAGNOSIS — Z431 Encounter for attention to gastrostomy: Secondary | ICD-10-CM | POA: Diagnosis not present

## 2018-03-09 DIAGNOSIS — F79 Unspecified intellectual disabilities: Secondary | ICD-10-CM | POA: Diagnosis not present

## 2018-03-09 DIAGNOSIS — Z431 Encounter for attention to gastrostomy: Secondary | ICD-10-CM | POA: Diagnosis not present

## 2018-03-09 DIAGNOSIS — K311 Adult hypertrophic pyloric stenosis: Secondary | ICD-10-CM | POA: Diagnosis not present

## 2018-03-09 DIAGNOSIS — J9859 Other diseases of mediastinum, not elsewhere classified: Secondary | ICD-10-CM | POA: Diagnosis not present

## 2018-03-09 DIAGNOSIS — I10 Essential (primary) hypertension: Secondary | ICD-10-CM | POA: Diagnosis not present

## 2018-03-09 DIAGNOSIS — Q871 Congenital malformation syndromes predominantly associated with short stature: Secondary | ICD-10-CM | POA: Diagnosis not present

## 2018-03-12 DIAGNOSIS — I890 Lymphedema, not elsewhere classified: Secondary | ICD-10-CM | POA: Diagnosis not present

## 2018-03-12 DIAGNOSIS — I10 Essential (primary) hypertension: Secondary | ICD-10-CM | POA: Diagnosis not present

## 2018-03-12 DIAGNOSIS — R238 Other skin changes: Secondary | ICD-10-CM | POA: Diagnosis not present

## 2018-03-13 DIAGNOSIS — J9859 Other diseases of mediastinum, not elsewhere classified: Secondary | ICD-10-CM | POA: Diagnosis not present

## 2018-03-13 DIAGNOSIS — K311 Adult hypertrophic pyloric stenosis: Secondary | ICD-10-CM | POA: Diagnosis not present

## 2018-03-13 DIAGNOSIS — I10 Essential (primary) hypertension: Secondary | ICD-10-CM | POA: Diagnosis not present

## 2018-03-13 DIAGNOSIS — Z431 Encounter for attention to gastrostomy: Secondary | ICD-10-CM | POA: Diagnosis not present

## 2018-03-13 DIAGNOSIS — F79 Unspecified intellectual disabilities: Secondary | ICD-10-CM | POA: Diagnosis not present

## 2018-03-13 DIAGNOSIS — Q871 Congenital malformation syndromes predominantly associated with short stature: Secondary | ICD-10-CM | POA: Diagnosis not present

## 2018-03-15 DIAGNOSIS — J9859 Other diseases of mediastinum, not elsewhere classified: Secondary | ICD-10-CM | POA: Diagnosis not present

## 2018-03-15 DIAGNOSIS — F79 Unspecified intellectual disabilities: Secondary | ICD-10-CM | POA: Diagnosis not present

## 2018-03-15 DIAGNOSIS — Z431 Encounter for attention to gastrostomy: Secondary | ICD-10-CM | POA: Diagnosis not present

## 2018-03-15 DIAGNOSIS — Q871 Congenital malformation syndromes predominantly associated with short stature: Secondary | ICD-10-CM | POA: Diagnosis not present

## 2018-03-15 DIAGNOSIS — I10 Essential (primary) hypertension: Secondary | ICD-10-CM | POA: Diagnosis not present

## 2018-03-15 DIAGNOSIS — K311 Adult hypertrophic pyloric stenosis: Secondary | ICD-10-CM | POA: Diagnosis not present

## 2018-03-19 DIAGNOSIS — K311 Adult hypertrophic pyloric stenosis: Secondary | ICD-10-CM | POA: Diagnosis not present

## 2018-03-19 DIAGNOSIS — I10 Essential (primary) hypertension: Secondary | ICD-10-CM | POA: Diagnosis not present

## 2018-03-19 DIAGNOSIS — J9859 Other diseases of mediastinum, not elsewhere classified: Secondary | ICD-10-CM | POA: Diagnosis not present

## 2018-03-19 DIAGNOSIS — F79 Unspecified intellectual disabilities: Secondary | ICD-10-CM | POA: Diagnosis not present

## 2018-03-19 DIAGNOSIS — Z431 Encounter for attention to gastrostomy: Secondary | ICD-10-CM | POA: Diagnosis not present

## 2018-03-19 DIAGNOSIS — Q871 Congenital malformation syndromes predominantly associated with short stature: Secondary | ICD-10-CM | POA: Diagnosis not present

## 2018-03-19 DIAGNOSIS — I890 Lymphedema, not elsewhere classified: Secondary | ICD-10-CM | POA: Diagnosis not present

## 2018-03-20 ENCOUNTER — Other Ambulatory Visit: Payer: Self-pay | Admitting: Family Medicine

## 2018-03-21 DIAGNOSIS — K311 Adult hypertrophic pyloric stenosis: Secondary | ICD-10-CM | POA: Diagnosis not present

## 2018-03-21 DIAGNOSIS — Z431 Encounter for attention to gastrostomy: Secondary | ICD-10-CM | POA: Diagnosis not present

## 2018-03-21 DIAGNOSIS — F79 Unspecified intellectual disabilities: Secondary | ICD-10-CM | POA: Diagnosis not present

## 2018-03-21 DIAGNOSIS — I10 Essential (primary) hypertension: Secondary | ICD-10-CM | POA: Diagnosis not present

## 2018-03-21 DIAGNOSIS — J9859 Other diseases of mediastinum, not elsewhere classified: Secondary | ICD-10-CM | POA: Diagnosis not present

## 2018-03-21 DIAGNOSIS — Q871 Congenital malformation syndromes predominantly associated with short stature: Secondary | ICD-10-CM | POA: Diagnosis not present

## 2018-03-27 DIAGNOSIS — Z431 Encounter for attention to gastrostomy: Secondary | ICD-10-CM | POA: Diagnosis not present

## 2018-03-27 DIAGNOSIS — K311 Adult hypertrophic pyloric stenosis: Secondary | ICD-10-CM | POA: Diagnosis not present

## 2018-03-27 DIAGNOSIS — I10 Essential (primary) hypertension: Secondary | ICD-10-CM | POA: Diagnosis not present

## 2018-03-27 DIAGNOSIS — J9859 Other diseases of mediastinum, not elsewhere classified: Secondary | ICD-10-CM | POA: Diagnosis not present

## 2018-03-27 DIAGNOSIS — Q871 Congenital malformation syndromes predominantly associated with short stature: Secondary | ICD-10-CM | POA: Diagnosis not present

## 2018-03-27 DIAGNOSIS — F79 Unspecified intellectual disabilities: Secondary | ICD-10-CM | POA: Diagnosis not present

## 2018-04-05 DIAGNOSIS — Z1231 Encounter for screening mammogram for malignant neoplasm of breast: Secondary | ICD-10-CM | POA: Diagnosis not present

## 2018-04-05 DIAGNOSIS — Z9181 History of falling: Secondary | ICD-10-CM | POA: Diagnosis not present

## 2018-04-05 DIAGNOSIS — Z0000 Encounter for general adult medical examination without abnormal findings: Secondary | ICD-10-CM | POA: Diagnosis not present

## 2018-04-05 DIAGNOSIS — Z6837 Body mass index (BMI) 37.0-37.9, adult: Secondary | ICD-10-CM | POA: Diagnosis not present

## 2018-04-05 DIAGNOSIS — Z1331 Encounter for screening for depression: Secondary | ICD-10-CM | POA: Diagnosis not present

## 2018-04-05 DIAGNOSIS — E785 Hyperlipidemia, unspecified: Secondary | ICD-10-CM | POA: Diagnosis not present

## 2018-04-06 DIAGNOSIS — J9859 Other diseases of mediastinum, not elsewhere classified: Secondary | ICD-10-CM | POA: Diagnosis not present

## 2018-04-06 DIAGNOSIS — Q871 Congenital malformation syndromes predominantly associated with short stature: Secondary | ICD-10-CM | POA: Diagnosis not present

## 2018-04-06 DIAGNOSIS — I10 Essential (primary) hypertension: Secondary | ICD-10-CM | POA: Diagnosis not present

## 2018-04-06 DIAGNOSIS — Z431 Encounter for attention to gastrostomy: Secondary | ICD-10-CM | POA: Diagnosis not present

## 2018-04-06 DIAGNOSIS — F79 Unspecified intellectual disabilities: Secondary | ICD-10-CM | POA: Diagnosis not present

## 2018-04-06 DIAGNOSIS — K311 Adult hypertrophic pyloric stenosis: Secondary | ICD-10-CM | POA: Diagnosis not present

## 2018-04-10 DIAGNOSIS — K311 Adult hypertrophic pyloric stenosis: Secondary | ICD-10-CM | POA: Diagnosis not present

## 2018-04-10 DIAGNOSIS — F79 Unspecified intellectual disabilities: Secondary | ICD-10-CM | POA: Diagnosis not present

## 2018-04-10 DIAGNOSIS — Q871 Congenital malformation syndromes predominantly associated with short stature: Secondary | ICD-10-CM | POA: Diagnosis not present

## 2018-04-10 DIAGNOSIS — Z431 Encounter for attention to gastrostomy: Secondary | ICD-10-CM | POA: Diagnosis not present

## 2018-04-10 DIAGNOSIS — I10 Essential (primary) hypertension: Secondary | ICD-10-CM | POA: Diagnosis not present

## 2018-04-10 DIAGNOSIS — J9859 Other diseases of mediastinum, not elsewhere classified: Secondary | ICD-10-CM | POA: Diagnosis not present

## 2018-04-18 DIAGNOSIS — J9859 Other diseases of mediastinum, not elsewhere classified: Secondary | ICD-10-CM | POA: Diagnosis not present

## 2018-04-18 DIAGNOSIS — I10 Essential (primary) hypertension: Secondary | ICD-10-CM | POA: Diagnosis not present

## 2018-04-18 DIAGNOSIS — Z431 Encounter for attention to gastrostomy: Secondary | ICD-10-CM | POA: Diagnosis not present

## 2018-04-18 DIAGNOSIS — Q871 Congenital malformation syndromes predominantly associated with short stature: Secondary | ICD-10-CM | POA: Diagnosis not present

## 2018-04-18 DIAGNOSIS — F79 Unspecified intellectual disabilities: Secondary | ICD-10-CM | POA: Diagnosis not present

## 2018-04-18 DIAGNOSIS — K311 Adult hypertrophic pyloric stenosis: Secondary | ICD-10-CM | POA: Diagnosis not present

## 2018-04-25 DIAGNOSIS — K311 Adult hypertrophic pyloric stenosis: Secondary | ICD-10-CM | POA: Diagnosis not present

## 2018-04-25 DIAGNOSIS — I10 Essential (primary) hypertension: Secondary | ICD-10-CM | POA: Diagnosis not present

## 2018-04-25 DIAGNOSIS — J9859 Other diseases of mediastinum, not elsewhere classified: Secondary | ICD-10-CM | POA: Diagnosis not present

## 2018-04-25 DIAGNOSIS — Z431 Encounter for attention to gastrostomy: Secondary | ICD-10-CM | POA: Diagnosis not present

## 2018-04-25 DIAGNOSIS — Q871 Congenital malformation syndromes predominantly associated with short stature: Secondary | ICD-10-CM | POA: Diagnosis not present

## 2018-04-25 DIAGNOSIS — F79 Unspecified intellectual disabilities: Secondary | ICD-10-CM | POA: Diagnosis not present

## 2018-04-26 DIAGNOSIS — I10 Essential (primary) hypertension: Secondary | ICD-10-CM | POA: Diagnosis not present

## 2018-04-26 DIAGNOSIS — F411 Generalized anxiety disorder: Secondary | ICD-10-CM | POA: Diagnosis not present

## 2018-04-26 DIAGNOSIS — Z23 Encounter for immunization: Secondary | ICD-10-CM | POA: Diagnosis not present

## 2018-04-26 DIAGNOSIS — Z1231 Encounter for screening mammogram for malignant neoplasm of breast: Secondary | ICD-10-CM | POA: Diagnosis not present

## 2018-04-26 DIAGNOSIS — E785 Hyperlipidemia, unspecified: Secondary | ICD-10-CM | POA: Diagnosis not present

## 2018-05-02 DIAGNOSIS — Q871 Congenital malformation syndromes predominantly associated with short stature: Secondary | ICD-10-CM | POA: Diagnosis not present

## 2018-05-02 DIAGNOSIS — K311 Adult hypertrophic pyloric stenosis: Secondary | ICD-10-CM | POA: Diagnosis not present

## 2018-05-02 DIAGNOSIS — Z431 Encounter for attention to gastrostomy: Secondary | ICD-10-CM | POA: Diagnosis not present

## 2018-05-02 DIAGNOSIS — F79 Unspecified intellectual disabilities: Secondary | ICD-10-CM | POA: Diagnosis not present

## 2018-05-02 DIAGNOSIS — I10 Essential (primary) hypertension: Secondary | ICD-10-CM | POA: Diagnosis not present

## 2018-05-02 DIAGNOSIS — J9859 Other diseases of mediastinum, not elsewhere classified: Secondary | ICD-10-CM | POA: Diagnosis not present

## 2018-05-25 DIAGNOSIS — Z1231 Encounter for screening mammogram for malignant neoplasm of breast: Secondary | ICD-10-CM | POA: Diagnosis not present

## 2018-08-02 DIAGNOSIS — I890 Lymphedema, not elsewhere classified: Secondary | ICD-10-CM | POA: Diagnosis not present

## 2018-08-02 DIAGNOSIS — F411 Generalized anxiety disorder: Secondary | ICD-10-CM | POA: Diagnosis not present

## 2018-08-02 DIAGNOSIS — E785 Hyperlipidemia, unspecified: Secondary | ICD-10-CM | POA: Diagnosis not present

## 2018-08-02 DIAGNOSIS — I10 Essential (primary) hypertension: Secondary | ICD-10-CM | POA: Diagnosis not present

## 2018-09-07 DIAGNOSIS — S61309A Unspecified open wound of unspecified finger with damage to nail, initial encounter: Secondary | ICD-10-CM | POA: Diagnosis not present

## 2018-09-07 DIAGNOSIS — L03011 Cellulitis of right finger: Secondary | ICD-10-CM | POA: Diagnosis not present

## 2018-09-07 DIAGNOSIS — Z6836 Body mass index (BMI) 36.0-36.9, adult: Secondary | ICD-10-CM | POA: Diagnosis not present

## 2018-10-01 DIAGNOSIS — E785 Hyperlipidemia, unspecified: Secondary | ICD-10-CM | POA: Diagnosis not present

## 2018-10-01 DIAGNOSIS — E890 Postprocedural hypothyroidism: Secondary | ICD-10-CM | POA: Diagnosis not present

## 2018-10-01 DIAGNOSIS — I10 Essential (primary) hypertension: Secondary | ICD-10-CM | POA: Diagnosis not present

## 2018-10-01 DIAGNOSIS — Z6835 Body mass index (BMI) 35.0-35.9, adult: Secondary | ICD-10-CM | POA: Diagnosis not present

## 2018-10-18 DIAGNOSIS — N920 Excessive and frequent menstruation with regular cycle: Secondary | ICD-10-CM | POA: Diagnosis not present

## 2018-10-18 DIAGNOSIS — J069 Acute upper respiratory infection, unspecified: Secondary | ICD-10-CM | POA: Diagnosis not present

## 2018-10-18 DIAGNOSIS — Z6835 Body mass index (BMI) 35.0-35.9, adult: Secondary | ICD-10-CM | POA: Diagnosis not present

## 2018-10-30 DIAGNOSIS — N946 Dysmenorrhea, unspecified: Secondary | ICD-10-CM | POA: Diagnosis not present

## 2018-10-30 DIAGNOSIS — Z6836 Body mass index (BMI) 36.0-36.9, adult: Secondary | ICD-10-CM | POA: Diagnosis not present

## 2018-10-30 DIAGNOSIS — Z124 Encounter for screening for malignant neoplasm of cervix: Secondary | ICD-10-CM | POA: Diagnosis not present

## 2018-11-06 DIAGNOSIS — N83202 Unspecified ovarian cyst, left side: Secondary | ICD-10-CM | POA: Diagnosis not present

## 2018-11-06 DIAGNOSIS — N946 Dysmenorrhea, unspecified: Secondary | ICD-10-CM | POA: Diagnosis not present

## 2018-11-13 DIAGNOSIS — I10 Essential (primary) hypertension: Secondary | ICD-10-CM | POA: Diagnosis not present

## 2018-11-13 DIAGNOSIS — F411 Generalized anxiety disorder: Secondary | ICD-10-CM | POA: Diagnosis not present

## 2018-11-13 DIAGNOSIS — E785 Hyperlipidemia, unspecified: Secondary | ICD-10-CM | POA: Diagnosis not present

## 2018-11-13 DIAGNOSIS — G4710 Hypersomnia, unspecified: Secondary | ICD-10-CM | POA: Diagnosis not present

## 2019-04-09 DIAGNOSIS — Z0000 Encounter for general adult medical examination without abnormal findings: Secondary | ICD-10-CM | POA: Diagnosis not present

## 2019-04-09 DIAGNOSIS — Z6836 Body mass index (BMI) 36.0-36.9, adult: Secondary | ICD-10-CM | POA: Diagnosis not present

## 2019-04-09 DIAGNOSIS — Z9181 History of falling: Secondary | ICD-10-CM | POA: Diagnosis not present

## 2019-04-09 DIAGNOSIS — E785 Hyperlipidemia, unspecified: Secondary | ICD-10-CM | POA: Diagnosis not present

## 2019-04-09 DIAGNOSIS — Z1231 Encounter for screening mammogram for malignant neoplasm of breast: Secondary | ICD-10-CM | POA: Diagnosis not present

## 2019-04-09 DIAGNOSIS — Z1331 Encounter for screening for depression: Secondary | ICD-10-CM | POA: Diagnosis not present

## 2019-06-10 DIAGNOSIS — Z79899 Other long term (current) drug therapy: Secondary | ICD-10-CM | POA: Diagnosis not present

## 2019-06-10 DIAGNOSIS — E785 Hyperlipidemia, unspecified: Secondary | ICD-10-CM | POA: Diagnosis not present

## 2019-06-10 DIAGNOSIS — G4710 Hypersomnia, unspecified: Secondary | ICD-10-CM | POA: Diagnosis not present

## 2019-06-10 DIAGNOSIS — I10 Essential (primary) hypertension: Secondary | ICD-10-CM | POA: Diagnosis not present

## 2019-06-10 DIAGNOSIS — Z23 Encounter for immunization: Secondary | ICD-10-CM | POA: Diagnosis not present

## 2019-06-10 DIAGNOSIS — E890 Postprocedural hypothyroidism: Secondary | ICD-10-CM | POA: Diagnosis not present

## 2019-09-16 DIAGNOSIS — I10 Essential (primary) hypertension: Secondary | ICD-10-CM | POA: Diagnosis not present

## 2019-09-16 DIAGNOSIS — G4710 Hypersomnia, unspecified: Secondary | ICD-10-CM | POA: Diagnosis not present

## 2019-09-16 DIAGNOSIS — E890 Postprocedural hypothyroidism: Secondary | ICD-10-CM | POA: Diagnosis not present

## 2019-09-16 DIAGNOSIS — E785 Hyperlipidemia, unspecified: Secondary | ICD-10-CM | POA: Diagnosis not present

## 2019-12-16 DIAGNOSIS — G4710 Hypersomnia, unspecified: Secondary | ICD-10-CM | POA: Diagnosis not present

## 2019-12-16 DIAGNOSIS — E785 Hyperlipidemia, unspecified: Secondary | ICD-10-CM | POA: Diagnosis not present

## 2019-12-16 DIAGNOSIS — E890 Postprocedural hypothyroidism: Secondary | ICD-10-CM | POA: Diagnosis not present

## 2019-12-16 DIAGNOSIS — I10 Essential (primary) hypertension: Secondary | ICD-10-CM | POA: Diagnosis not present

## 2020-03-17 DIAGNOSIS — Z79899 Other long term (current) drug therapy: Secondary | ICD-10-CM | POA: Diagnosis not present

## 2020-03-17 DIAGNOSIS — I10 Essential (primary) hypertension: Secondary | ICD-10-CM | POA: Diagnosis not present

## 2020-03-17 DIAGNOSIS — G4710 Hypersomnia, unspecified: Secondary | ICD-10-CM | POA: Diagnosis not present

## 2020-03-17 DIAGNOSIS — E559 Vitamin D deficiency, unspecified: Secondary | ICD-10-CM | POA: Diagnosis not present

## 2020-03-17 DIAGNOSIS — E785 Hyperlipidemia, unspecified: Secondary | ICD-10-CM | POA: Diagnosis not present

## 2020-03-17 DIAGNOSIS — Q8711 Prader-Willi syndrome: Secondary | ICD-10-CM | POA: Diagnosis not present

## 2020-03-17 DIAGNOSIS — E669 Obesity, unspecified: Secondary | ICD-10-CM | POA: Diagnosis not present

## 2020-03-17 DIAGNOSIS — F411 Generalized anxiety disorder: Secondary | ICD-10-CM | POA: Diagnosis not present

## 2020-03-17 DIAGNOSIS — Z6837 Body mass index (BMI) 37.0-37.9, adult: Secondary | ICD-10-CM | POA: Diagnosis not present

## 2020-03-17 DIAGNOSIS — E890 Postprocedural hypothyroidism: Secondary | ICD-10-CM | POA: Diagnosis not present

## 2020-03-17 DIAGNOSIS — G4730 Sleep apnea, unspecified: Secondary | ICD-10-CM | POA: Diagnosis not present

## 2020-06-18 DIAGNOSIS — E785 Hyperlipidemia, unspecified: Secondary | ICD-10-CM | POA: Diagnosis not present

## 2020-06-18 DIAGNOSIS — F411 Generalized anxiety disorder: Secondary | ICD-10-CM | POA: Diagnosis not present

## 2020-06-18 DIAGNOSIS — E890 Postprocedural hypothyroidism: Secondary | ICD-10-CM | POA: Diagnosis not present

## 2020-06-18 DIAGNOSIS — E559 Vitamin D deficiency, unspecified: Secondary | ICD-10-CM | POA: Diagnosis not present

## 2020-06-18 DIAGNOSIS — G4710 Hypersomnia, unspecified: Secondary | ICD-10-CM | POA: Diagnosis not present

## 2020-06-18 DIAGNOSIS — Z1331 Encounter for screening for depression: Secondary | ICD-10-CM | POA: Diagnosis not present

## 2020-06-18 DIAGNOSIS — Z23 Encounter for immunization: Secondary | ICD-10-CM | POA: Diagnosis not present

## 2020-06-18 DIAGNOSIS — Q8711 Prader-Willi syndrome: Secondary | ICD-10-CM | POA: Diagnosis not present

## 2020-06-18 DIAGNOSIS — I10 Essential (primary) hypertension: Secondary | ICD-10-CM | POA: Diagnosis not present

## 2020-06-18 DIAGNOSIS — Z79899 Other long term (current) drug therapy: Secondary | ICD-10-CM | POA: Diagnosis not present

## 2020-06-18 DIAGNOSIS — Z6837 Body mass index (BMI) 37.0-37.9, adult: Secondary | ICD-10-CM | POA: Diagnosis not present

## 2020-06-18 DIAGNOSIS — G4730 Sleep apnea, unspecified: Secondary | ICD-10-CM | POA: Diagnosis not present

## 2020-06-21 IMAGING — CT CT ABD-PELV W/ CM
2 of 5 series · 15 of 46 positions shown, 17 images · IV contrast (Omni 300)
Comparison: None.

CLINICAL DATA: Mid to lower abdominal pain.

EXAM:
CT ABDOMEN AND PELVIS WITH CONTRAST
TECHNIQUE: Multidetector CT imaging of the abdomen and pelvis was performed
using the standard protocol following bolus administration of
intravenous contrast.
CONTRAST:  100mL OMNIPAQUE IOHEXOL 300 MG/ML  SOLN

[Series 3: a/p w/ 5mm · axial · 0.83mm/px · z∈[+632,+1032]mm · 12 of 90 slices shown, 14 images]
[im 5/90  soft-tissue]
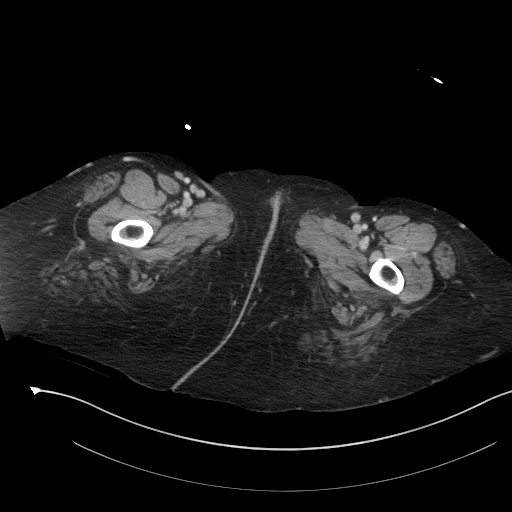
[im 5/90  bone]
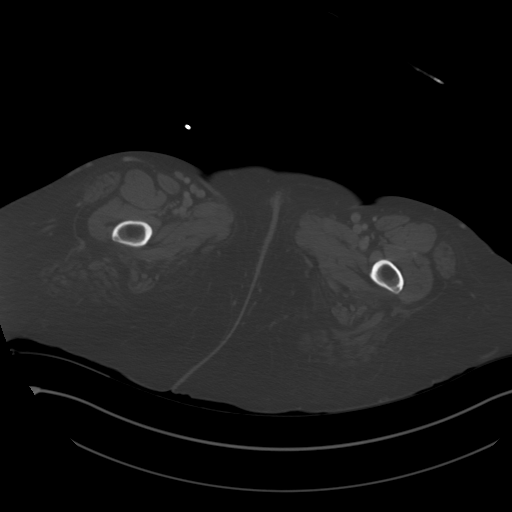
[im 15/90  soft-tissue]
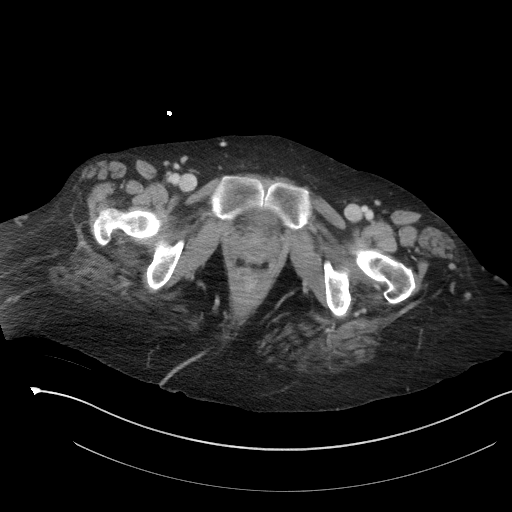
[im 19/90  soft-tissue]
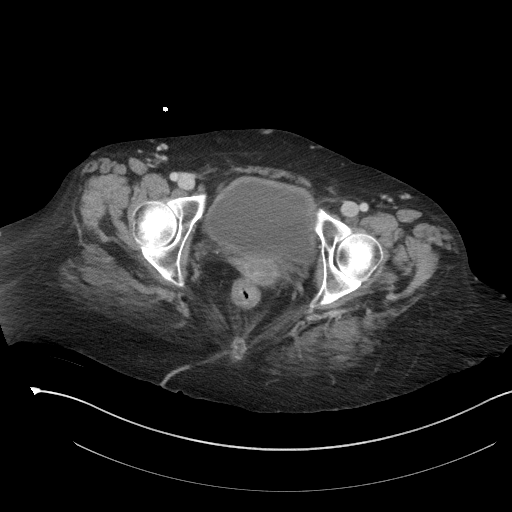
[im 29/90  soft-tissue]
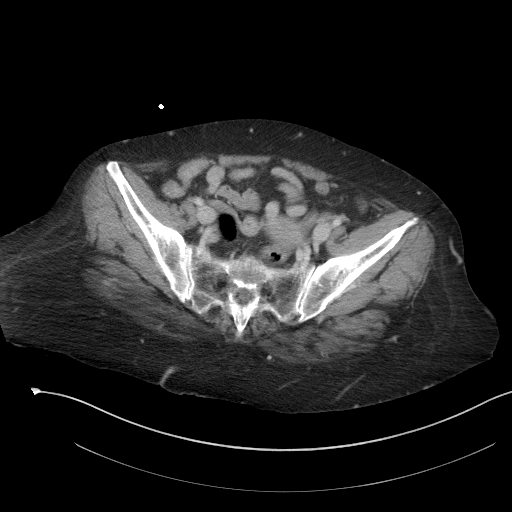
[im 33/90  soft-tissue]
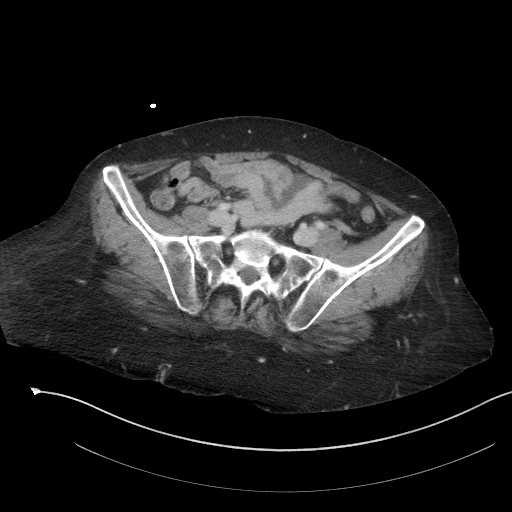
[im 43/90  soft-tissue]
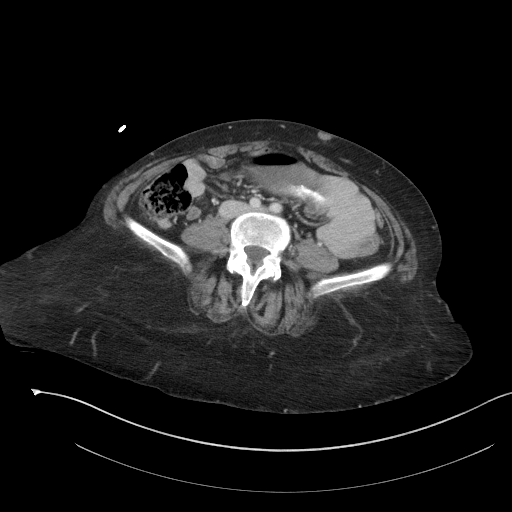
[im 47/90  soft-tissue]
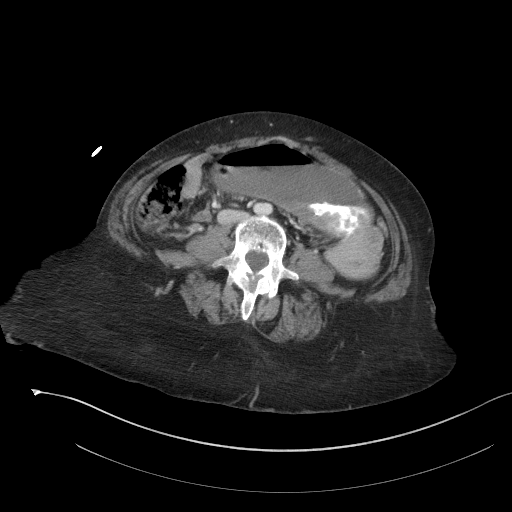
[im 57/90  soft-tissue]
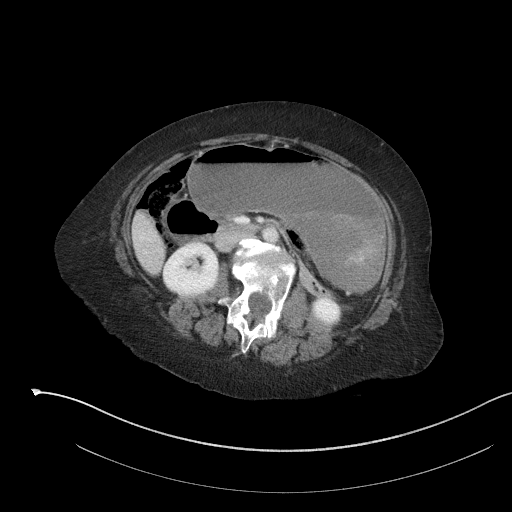
[im 61/90  soft-tissue]
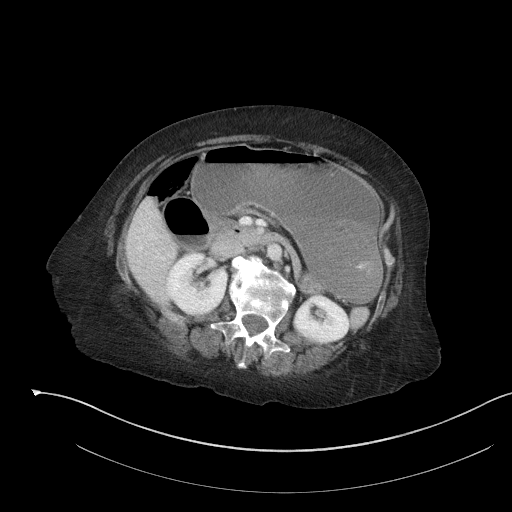
[im 61/90  bone]
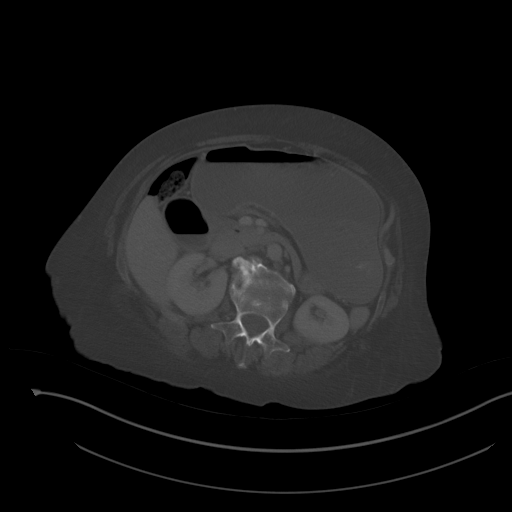
[im 71/90  soft-tissue]
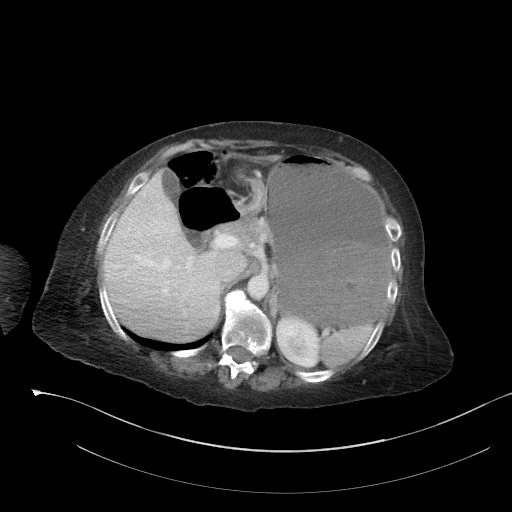
[im 75/90  soft-tissue]
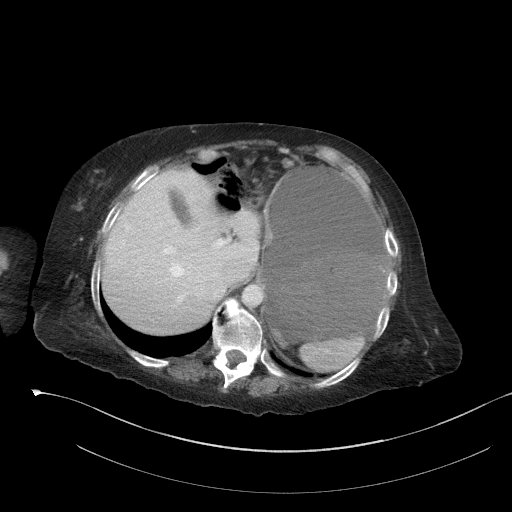
[im 85/90  soft-tissue]
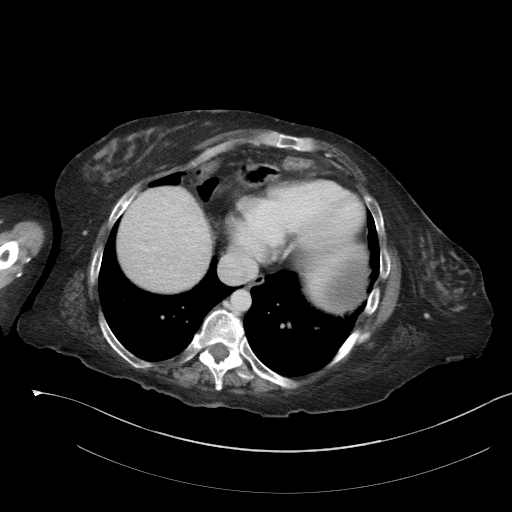

[Series 6: a/p w/ cor · coronal · 0.85mm/px · 3 of 132 slices shown]
[im 44/132  soft-tissue]
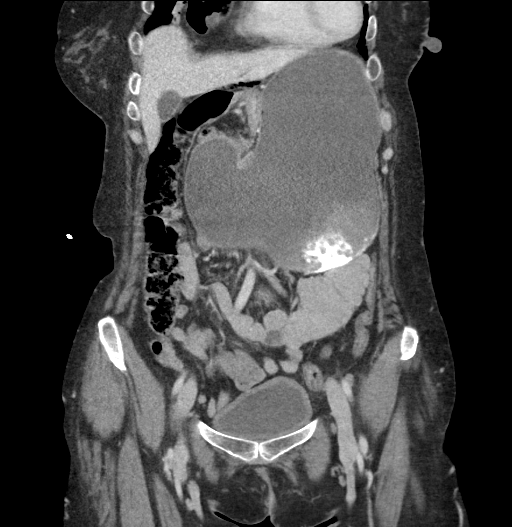
[im 59/132  soft-tissue]
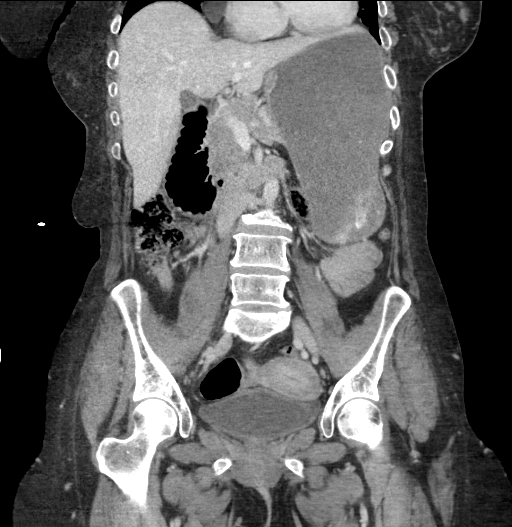
[im 73/132  soft-tissue]
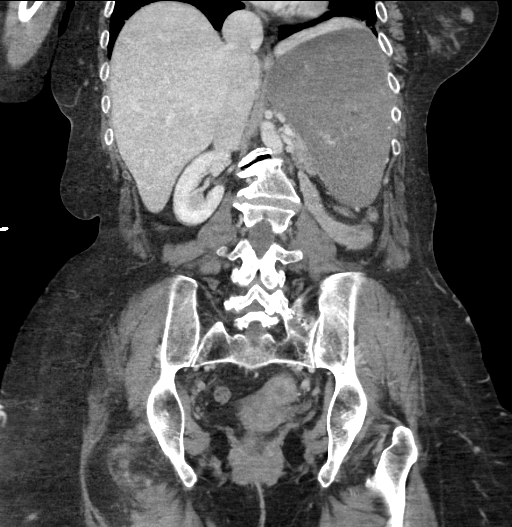

[15 of 46 positions shown; findings below may reference images not displayed]

FINDINGS: Lower chest: Herniated colon is identified anterior to the liver and
heart.

Hepatobiliary: No focal abnormality within the liver parenchyma.
There is no evidence for gallstones, gallbladder wall thickening, or
pericholecystic fluid. No intrahepatic or extrahepatic biliary
dilation.

Pancreas: No focal mass lesion. No dilatation of the main duct. No
intraparenchymal cyst. No peripancreatic edema.

Spleen: No splenomegaly. No focal mass lesion.

Adrenals/Urinary Tract: No adrenal nodule or mass. Right kidney
unremarkable. 6 mm low-density lesion upper pole left kidney is too
small to characterize but likely a cyst. No evidence for
hydroureter. The urinary bladder appears normal for the degree of
distention.

Stomach/Bowel: Stomach is massively distended. There is substantial
mass-effect on the pyloric region of the stomach (axial image 21
series 3 and coronal image 48 of series 6. Duodenum is moderately
distended. Jejunal loops and ileal loops are nondilated. The
terminal ileum is normal. The appendix is not visualized, but there
is no edema or inflammation in the region of the cecum. Ascending
colon unremarkable. Transverse colon extends up into a hernia
involving the anterior mediastinum with nondilated segments of
transverse colon identified anterior to the heart. Transverse colon
than exits down through the hernia defect along the duodenum and
then courses posterior to the stomach. Colon can be identified
between the stomach and the portal splenic confluence on image 29 of
series 3. Distal colon is decompressed and unremarkable.

Vascular/Lymphatic: No abdominal aortic aneurysm. No abdominal
aortic atherosclerotic calcification. Portal vein and superior
mesenteric vein are patent. There is no gastrohepatic or
hepatoduodenal ligament lymphadenopathy. No intraperitoneal or
retroperitoneal lymphadenopathy. No pelvic sidewall lymphadenopathy.

Reproductive: The uterus has normal CT imaging appearance. There is
no adnexal mass.

Other: No intraperitoneal free fluid.

Musculoskeletal: No worrisome lytic or sclerotic osseous
abnormality. sclerotic focus in the posterior sacrum is likely a
bone island.
IMPRESSION: Stomach is markedly distended and fluid-filled with evidence of
mass-effect on the antral region. This finding is related to
herniation of omentum and transverse colon up into the anterior
mediastinum, anterior to the heart. The herniated fat and colon
generates substantial mass-effect on the distal stomach and proximal
duodenum which occupy a high position in the anterior abdomen, in
this mass-effect appears to result in a gastric outlet obstruction.
There is no evidence of gastric wall thickening, colonic
obstruction, or colonic wall thickening. A tiny amount of free fluid
is identified in the anterior mediastinal hernia.

I discussed these results by telephone at the time of interpretation
on 02/23/2018 at [DATE] with FIORELLA DU, PA .

## 2020-07-04 DIAGNOSIS — Z23 Encounter for immunization: Secondary | ICD-10-CM | POA: Diagnosis not present

## 2020-08-25 DIAGNOSIS — Z1231 Encounter for screening mammogram for malignant neoplasm of breast: Secondary | ICD-10-CM | POA: Diagnosis not present

## 2020-08-31 DIAGNOSIS — Z03818 Encounter for observation for suspected exposure to other biological agents ruled out: Secondary | ICD-10-CM | POA: Diagnosis not present

## 2020-09-03 DIAGNOSIS — E669 Obesity, unspecified: Secondary | ICD-10-CM | POA: Diagnosis not present

## 2020-09-03 DIAGNOSIS — Z9181 History of falling: Secondary | ICD-10-CM | POA: Diagnosis not present

## 2020-09-03 DIAGNOSIS — Z1331 Encounter for screening for depression: Secondary | ICD-10-CM | POA: Diagnosis not present

## 2020-09-03 DIAGNOSIS — E785 Hyperlipidemia, unspecified: Secondary | ICD-10-CM | POA: Diagnosis not present

## 2020-09-03 DIAGNOSIS — Z0000 Encounter for general adult medical examination without abnormal findings: Secondary | ICD-10-CM | POA: Diagnosis not present

## 2020-09-18 DIAGNOSIS — G4730 Sleep apnea, unspecified: Secondary | ICD-10-CM | POA: Diagnosis not present

## 2020-09-18 DIAGNOSIS — Z23 Encounter for immunization: Secondary | ICD-10-CM | POA: Diagnosis not present

## 2020-09-18 DIAGNOSIS — E890 Postprocedural hypothyroidism: Secondary | ICD-10-CM | POA: Diagnosis not present

## 2020-09-18 DIAGNOSIS — I10 Essential (primary) hypertension: Secondary | ICD-10-CM | POA: Diagnosis not present

## 2020-09-18 DIAGNOSIS — E785 Hyperlipidemia, unspecified: Secondary | ICD-10-CM | POA: Diagnosis not present

## 2020-09-18 DIAGNOSIS — E559 Vitamin D deficiency, unspecified: Secondary | ICD-10-CM | POA: Diagnosis not present

## 2020-09-18 DIAGNOSIS — Z79899 Other long term (current) drug therapy: Secondary | ICD-10-CM | POA: Diagnosis not present

## 2020-09-18 DIAGNOSIS — E669 Obesity, unspecified: Secondary | ICD-10-CM | POA: Diagnosis not present

## 2020-09-18 DIAGNOSIS — Z6838 Body mass index (BMI) 38.0-38.9, adult: Secondary | ICD-10-CM | POA: Diagnosis not present

## 2020-09-18 DIAGNOSIS — Q8711 Prader-Willi syndrome: Secondary | ICD-10-CM | POA: Diagnosis not present

## 2020-09-18 DIAGNOSIS — F411 Generalized anxiety disorder: Secondary | ICD-10-CM | POA: Diagnosis not present

## 2020-09-18 DIAGNOSIS — G4710 Hypersomnia, unspecified: Secondary | ICD-10-CM | POA: Diagnosis not present

## 2020-12-14 DIAGNOSIS — E669 Obesity, unspecified: Secondary | ICD-10-CM | POA: Diagnosis not present

## 2020-12-14 DIAGNOSIS — Z6838 Body mass index (BMI) 38.0-38.9, adult: Secondary | ICD-10-CM | POA: Diagnosis not present

## 2020-12-14 DIAGNOSIS — G4710 Hypersomnia, unspecified: Secondary | ICD-10-CM | POA: Diagnosis not present

## 2020-12-14 DIAGNOSIS — E559 Vitamin D deficiency, unspecified: Secondary | ICD-10-CM | POA: Diagnosis not present

## 2020-12-14 DIAGNOSIS — F411 Generalized anxiety disorder: Secondary | ICD-10-CM | POA: Diagnosis not present

## 2020-12-14 DIAGNOSIS — G4730 Sleep apnea, unspecified: Secondary | ICD-10-CM | POA: Diagnosis not present

## 2020-12-14 DIAGNOSIS — I10 Essential (primary) hypertension: Secondary | ICD-10-CM | POA: Diagnosis not present

## 2020-12-14 DIAGNOSIS — E876 Hypokalemia: Secondary | ICD-10-CM | POA: Diagnosis not present

## 2020-12-14 DIAGNOSIS — E785 Hyperlipidemia, unspecified: Secondary | ICD-10-CM | POA: Diagnosis not present

## 2020-12-14 DIAGNOSIS — Z79899 Other long term (current) drug therapy: Secondary | ICD-10-CM | POA: Diagnosis not present

## 2020-12-14 DIAGNOSIS — Q8711 Prader-Willi syndrome: Secondary | ICD-10-CM | POA: Diagnosis not present

## 2020-12-14 DIAGNOSIS — E890 Postprocedural hypothyroidism: Secondary | ICD-10-CM | POA: Diagnosis not present

## 2021-03-15 DIAGNOSIS — G4730 Sleep apnea, unspecified: Secondary | ICD-10-CM | POA: Diagnosis not present

## 2021-03-15 DIAGNOSIS — F411 Generalized anxiety disorder: Secondary | ICD-10-CM | POA: Diagnosis not present

## 2021-03-15 DIAGNOSIS — Q8711 Prader-Willi syndrome: Secondary | ICD-10-CM | POA: Diagnosis not present

## 2021-03-15 DIAGNOSIS — E669 Obesity, unspecified: Secondary | ICD-10-CM | POA: Diagnosis not present

## 2021-03-15 DIAGNOSIS — E785 Hyperlipidemia, unspecified: Secondary | ICD-10-CM | POA: Diagnosis not present

## 2021-03-15 DIAGNOSIS — Z6838 Body mass index (BMI) 38.0-38.9, adult: Secondary | ICD-10-CM | POA: Diagnosis not present

## 2021-03-15 DIAGNOSIS — G4710 Hypersomnia, unspecified: Secondary | ICD-10-CM | POA: Diagnosis not present

## 2021-03-15 DIAGNOSIS — E559 Vitamin D deficiency, unspecified: Secondary | ICD-10-CM | POA: Diagnosis not present

## 2021-03-15 DIAGNOSIS — E890 Postprocedural hypothyroidism: Secondary | ICD-10-CM | POA: Diagnosis not present

## 2021-03-15 DIAGNOSIS — I10 Essential (primary) hypertension: Secondary | ICD-10-CM | POA: Diagnosis not present

## 2021-03-15 DIAGNOSIS — Z79899 Other long term (current) drug therapy: Secondary | ICD-10-CM | POA: Diagnosis not present

## 2021-03-15 DIAGNOSIS — E876 Hypokalemia: Secondary | ICD-10-CM | POA: Diagnosis not present

## 2021-06-03 DIAGNOSIS — Z6838 Body mass index (BMI) 38.0-38.9, adult: Secondary | ICD-10-CM | POA: Diagnosis not present

## 2021-06-03 DIAGNOSIS — G4730 Sleep apnea, unspecified: Secondary | ICD-10-CM | POA: Diagnosis not present

## 2021-06-03 DIAGNOSIS — Q8711 Prader-Willi syndrome: Secondary | ICD-10-CM | POA: Diagnosis not present

## 2021-06-03 DIAGNOSIS — E785 Hyperlipidemia, unspecified: Secondary | ICD-10-CM | POA: Diagnosis not present

## 2021-06-03 DIAGNOSIS — E669 Obesity, unspecified: Secondary | ICD-10-CM | POA: Diagnosis not present

## 2021-06-03 DIAGNOSIS — I10 Essential (primary) hypertension: Secondary | ICD-10-CM | POA: Diagnosis not present

## 2021-06-03 DIAGNOSIS — Z23 Encounter for immunization: Secondary | ICD-10-CM | POA: Diagnosis not present

## 2021-06-03 DIAGNOSIS — E559 Vitamin D deficiency, unspecified: Secondary | ICD-10-CM | POA: Diagnosis not present

## 2021-06-03 DIAGNOSIS — G4710 Hypersomnia, unspecified: Secondary | ICD-10-CM | POA: Diagnosis not present

## 2021-06-03 DIAGNOSIS — Z79899 Other long term (current) drug therapy: Secondary | ICD-10-CM | POA: Diagnosis not present

## 2021-06-03 DIAGNOSIS — F411 Generalized anxiety disorder: Secondary | ICD-10-CM | POA: Diagnosis not present

## 2021-06-03 DIAGNOSIS — E890 Postprocedural hypothyroidism: Secondary | ICD-10-CM | POA: Diagnosis not present

## 2021-09-09 DIAGNOSIS — Z6838 Body mass index (BMI) 38.0-38.9, adult: Secondary | ICD-10-CM | POA: Diagnosis not present

## 2021-09-09 DIAGNOSIS — E890 Postprocedural hypothyroidism: Secondary | ICD-10-CM | POA: Diagnosis not present

## 2021-09-09 DIAGNOSIS — Z79899 Other long term (current) drug therapy: Secondary | ICD-10-CM | POA: Diagnosis not present

## 2021-09-09 DIAGNOSIS — E559 Vitamin D deficiency, unspecified: Secondary | ICD-10-CM | POA: Diagnosis not present

## 2021-09-09 DIAGNOSIS — F411 Generalized anxiety disorder: Secondary | ICD-10-CM | POA: Diagnosis not present

## 2021-09-09 DIAGNOSIS — G4730 Sleep apnea, unspecified: Secondary | ICD-10-CM | POA: Diagnosis not present

## 2021-09-09 DIAGNOSIS — E876 Hypokalemia: Secondary | ICD-10-CM | POA: Diagnosis not present

## 2021-09-09 DIAGNOSIS — Q8711 Prader-Willi syndrome: Secondary | ICD-10-CM | POA: Diagnosis not present

## 2021-09-09 DIAGNOSIS — I10 Essential (primary) hypertension: Secondary | ICD-10-CM | POA: Diagnosis not present

## 2021-09-09 DIAGNOSIS — I890 Lymphedema, not elsewhere classified: Secondary | ICD-10-CM | POA: Diagnosis not present

## 2021-09-09 DIAGNOSIS — E669 Obesity, unspecified: Secondary | ICD-10-CM | POA: Diagnosis not present

## 2021-09-09 DIAGNOSIS — G4710 Hypersomnia, unspecified: Secondary | ICD-10-CM | POA: Diagnosis not present

## 2021-10-11 DIAGNOSIS — Z23 Encounter for immunization: Secondary | ICD-10-CM | POA: Diagnosis not present

## 2021-12-09 DIAGNOSIS — G4730 Sleep apnea, unspecified: Secondary | ICD-10-CM | POA: Diagnosis not present

## 2021-12-09 DIAGNOSIS — I890 Lymphedema, not elsewhere classified: Secondary | ICD-10-CM | POA: Diagnosis not present

## 2021-12-09 DIAGNOSIS — E890 Postprocedural hypothyroidism: Secondary | ICD-10-CM | POA: Diagnosis not present

## 2021-12-09 DIAGNOSIS — G4710 Hypersomnia, unspecified: Secondary | ICD-10-CM | POA: Diagnosis not present

## 2021-12-09 DIAGNOSIS — I10 Essential (primary) hypertension: Secondary | ICD-10-CM | POA: Diagnosis not present

## 2021-12-09 DIAGNOSIS — E785 Hyperlipidemia, unspecified: Secondary | ICD-10-CM | POA: Diagnosis not present

## 2021-12-09 DIAGNOSIS — E559 Vitamin D deficiency, unspecified: Secondary | ICD-10-CM | POA: Diagnosis not present

## 2021-12-09 DIAGNOSIS — E876 Hypokalemia: Secondary | ICD-10-CM | POA: Diagnosis not present

## 2021-12-09 DIAGNOSIS — J309 Allergic rhinitis, unspecified: Secondary | ICD-10-CM | POA: Diagnosis not present

## 2021-12-09 DIAGNOSIS — Q8711 Prader-Willi syndrome: Secondary | ICD-10-CM | POA: Diagnosis not present

## 2021-12-09 DIAGNOSIS — F411 Generalized anxiety disorder: Secondary | ICD-10-CM | POA: Diagnosis not present

## 2022-03-05 ENCOUNTER — Emergency Department (HOSPITAL_COMMUNITY): Payer: Medicare Other

## 2022-03-05 ENCOUNTER — Other Ambulatory Visit: Payer: Self-pay

## 2022-03-05 ENCOUNTER — Inpatient Hospital Stay (HOSPITAL_COMMUNITY)
Admission: EM | Admit: 2022-03-05 | Discharge: 2022-04-22 | DRG: 326 | Disposition: E | Payer: Medicare Other | Attending: Internal Medicine | Admitting: Internal Medicine

## 2022-03-05 ENCOUNTER — Encounter (HOSPITAL_COMMUNITY): Payer: Self-pay

## 2022-03-05 DIAGNOSIS — E039 Hypothyroidism, unspecified: Secondary | ICD-10-CM | POA: Diagnosis not present

## 2022-03-05 DIAGNOSIS — D62 Acute posthemorrhagic anemia: Secondary | ICD-10-CM | POA: Diagnosis not present

## 2022-03-05 DIAGNOSIS — Q430 Meckel's diverticulum (displaced) (hypertrophic): Secondary | ICD-10-CM | POA: Diagnosis not present

## 2022-03-05 DIAGNOSIS — Z885 Allergy status to narcotic agent status: Secondary | ICD-10-CM

## 2022-03-05 DIAGNOSIS — E669 Obesity, unspecified: Secondary | ICD-10-CM | POA: Diagnosis present

## 2022-03-05 DIAGNOSIS — J9811 Atelectasis: Secondary | ICD-10-CM | POA: Diagnosis not present

## 2022-03-05 DIAGNOSIS — E890 Postprocedural hypothyroidism: Secondary | ICD-10-CM | POA: Diagnosis present

## 2022-03-05 DIAGNOSIS — D72829 Elevated white blood cell count, unspecified: Secondary | ICD-10-CM | POA: Diagnosis not present

## 2022-03-05 DIAGNOSIS — K449 Diaphragmatic hernia without obstruction or gangrene: Secondary | ICD-10-CM | POA: Diagnosis not present

## 2022-03-05 DIAGNOSIS — Q790 Congenital diaphragmatic hernia: Secondary | ICD-10-CM

## 2022-03-05 DIAGNOSIS — R531 Weakness: Secondary | ICD-10-CM | POA: Diagnosis not present

## 2022-03-05 DIAGNOSIS — Z79890 Hormone replacement therapy (postmenopausal): Secondary | ICD-10-CM

## 2022-03-05 DIAGNOSIS — I248 Other forms of acute ischemic heart disease: Secondary | ICD-10-CM | POA: Diagnosis not present

## 2022-03-05 DIAGNOSIS — E876 Hypokalemia: Secondary | ICD-10-CM | POA: Diagnosis present

## 2022-03-05 DIAGNOSIS — J90 Pleural effusion, not elsewhere classified: Secondary | ICD-10-CM | POA: Diagnosis not present

## 2022-03-05 DIAGNOSIS — I890 Lymphedema, not elsewhere classified: Secondary | ICD-10-CM | POA: Diagnosis present

## 2022-03-05 DIAGNOSIS — J982 Interstitial emphysema: Secondary | ICD-10-CM | POA: Diagnosis not present

## 2022-03-05 DIAGNOSIS — E785 Hyperlipidemia, unspecified: Secondary | ICD-10-CM | POA: Diagnosis not present

## 2022-03-05 DIAGNOSIS — K469 Unspecified abdominal hernia without obstruction or gangrene: Secondary | ICD-10-CM | POA: Diagnosis not present

## 2022-03-05 DIAGNOSIS — Z452 Encounter for adjustment and management of vascular access device: Secondary | ICD-10-CM | POA: Diagnosis not present

## 2022-03-05 DIAGNOSIS — D649 Anemia, unspecified: Secondary | ICD-10-CM | POA: Diagnosis not present

## 2022-03-05 DIAGNOSIS — Z66 Do not resuscitate: Secondary | ICD-10-CM | POA: Diagnosis not present

## 2022-03-05 DIAGNOSIS — Z4682 Encounter for fitting and adjustment of non-vascular catheter: Secondary | ICD-10-CM | POA: Diagnosis not present

## 2022-03-05 DIAGNOSIS — K3189 Other diseases of stomach and duodenum: Secondary | ICD-10-CM | POA: Diagnosis present

## 2022-03-05 DIAGNOSIS — R012 Other cardiac sounds: Secondary | ICD-10-CM | POA: Diagnosis not present

## 2022-03-05 DIAGNOSIS — Q8711 Prader-Willi syndrome: Secondary | ICD-10-CM | POA: Diagnosis not present

## 2022-03-05 DIAGNOSIS — K311 Adult hypertrophic pyloric stenosis: Secondary | ICD-10-CM | POA: Diagnosis not present

## 2022-03-05 DIAGNOSIS — N3000 Acute cystitis without hematuria: Secondary | ICD-10-CM | POA: Diagnosis not present

## 2022-03-05 DIAGNOSIS — R0689 Other abnormalities of breathing: Secondary | ICD-10-CM

## 2022-03-05 DIAGNOSIS — Z515 Encounter for palliative care: Secondary | ICD-10-CM | POA: Diagnosis not present

## 2022-03-05 DIAGNOSIS — N390 Urinary tract infection, site not specified: Secondary | ICD-10-CM | POA: Diagnosis not present

## 2022-03-05 DIAGNOSIS — I10 Essential (primary) hypertension: Secondary | ICD-10-CM | POA: Diagnosis present

## 2022-03-05 DIAGNOSIS — G8918 Other acute postprocedural pain: Secondary | ICD-10-CM | POA: Diagnosis not present

## 2022-03-05 DIAGNOSIS — R34 Anuria and oliguria: Secondary | ICD-10-CM | POA: Diagnosis not present

## 2022-03-05 DIAGNOSIS — F419 Anxiety disorder, unspecified: Secondary | ICD-10-CM | POA: Diagnosis present

## 2022-03-05 DIAGNOSIS — N951 Menopausal and female climacteric states: Secondary | ICD-10-CM | POA: Diagnosis present

## 2022-03-05 DIAGNOSIS — L89312 Pressure ulcer of right buttock, stage 2: Secondary | ICD-10-CM | POA: Diagnosis not present

## 2022-03-05 DIAGNOSIS — R739 Hyperglycemia, unspecified: Secondary | ICD-10-CM | POA: Diagnosis not present

## 2022-03-05 DIAGNOSIS — I959 Hypotension, unspecified: Secondary | ICD-10-CM | POA: Diagnosis not present

## 2022-03-05 DIAGNOSIS — E874 Mixed disorder of acid-base balance: Secondary | ICD-10-CM | POA: Diagnosis not present

## 2022-03-05 DIAGNOSIS — L309 Dermatitis, unspecified: Secondary | ICD-10-CM | POA: Diagnosis not present

## 2022-03-05 DIAGNOSIS — R011 Cardiac murmur, unspecified: Secondary | ICD-10-CM | POA: Diagnosis present

## 2022-03-05 DIAGNOSIS — K440 Diaphragmatic hernia with obstruction, without gangrene: Principal | ICD-10-CM | POA: Diagnosis present

## 2022-03-05 DIAGNOSIS — K567 Ileus, unspecified: Secondary | ICD-10-CM | POA: Diagnosis not present

## 2022-03-05 DIAGNOSIS — M79605 Pain in left leg: Secondary | ICD-10-CM | POA: Diagnosis not present

## 2022-03-05 DIAGNOSIS — J9602 Acute respiratory failure with hypercapnia: Secondary | ICD-10-CM | POA: Diagnosis not present

## 2022-03-05 DIAGNOSIS — W19XXXA Unspecified fall, initial encounter: Secondary | ICD-10-CM | POA: Diagnosis not present

## 2022-03-05 DIAGNOSIS — R000 Tachycardia, unspecified: Secondary | ICD-10-CM | POA: Diagnosis not present

## 2022-03-05 DIAGNOSIS — R1084 Generalized abdominal pain: Principal | ICD-10-CM

## 2022-03-05 DIAGNOSIS — R32 Unspecified urinary incontinence: Secondary | ICD-10-CM | POA: Diagnosis not present

## 2022-03-05 DIAGNOSIS — Z6839 Body mass index (BMI) 39.0-39.9, adult: Secondary | ICD-10-CM | POA: Diagnosis not present

## 2022-03-05 DIAGNOSIS — R6250 Unspecified lack of expected normal physiological development in childhood: Secondary | ICD-10-CM | POA: Diagnosis present

## 2022-03-05 DIAGNOSIS — R10811 Right upper quadrant abdominal tenderness: Secondary | ICD-10-CM | POA: Diagnosis present

## 2022-03-05 DIAGNOSIS — E8770 Fluid overload, unspecified: Secondary | ICD-10-CM | POA: Diagnosis not present

## 2022-03-05 DIAGNOSIS — K56609 Unspecified intestinal obstruction, unspecified as to partial versus complete obstruction: Secondary | ICD-10-CM | POA: Diagnosis present

## 2022-03-05 DIAGNOSIS — L89322 Pressure ulcer of left buttock, stage 2: Secondary | ICD-10-CM | POA: Diagnosis not present

## 2022-03-05 DIAGNOSIS — J95821 Acute postprocedural respiratory failure: Secondary | ICD-10-CM | POA: Diagnosis not present

## 2022-03-05 DIAGNOSIS — R571 Hypovolemic shock: Secondary | ICD-10-CM | POA: Diagnosis not present

## 2022-03-05 DIAGNOSIS — R6889 Other general symptoms and signs: Secondary | ICD-10-CM | POA: Diagnosis not present

## 2022-03-05 DIAGNOSIS — J9859 Other diseases of mediastinum, not elsewhere classified: Secondary | ICD-10-CM | POA: Diagnosis present

## 2022-03-05 DIAGNOSIS — J309 Allergic rhinitis, unspecified: Secondary | ICD-10-CM | POA: Diagnosis present

## 2022-03-05 DIAGNOSIS — R197 Diarrhea, unspecified: Secondary | ICD-10-CM | POA: Diagnosis not present

## 2022-03-05 DIAGNOSIS — I9581 Postprocedural hypotension: Secondary | ICD-10-CM | POA: Diagnosis not present

## 2022-03-05 DIAGNOSIS — G4733 Obstructive sleep apnea (adult) (pediatric): Secondary | ICD-10-CM | POA: Diagnosis not present

## 2022-03-05 DIAGNOSIS — F79 Unspecified intellectual disabilities: Secondary | ICD-10-CM | POA: Diagnosis present

## 2022-03-05 DIAGNOSIS — I3139 Other pericardial effusion (noninflammatory): Secondary | ICD-10-CM | POA: Diagnosis not present

## 2022-03-05 DIAGNOSIS — R159 Full incontinence of feces: Secondary | ICD-10-CM | POA: Diagnosis not present

## 2022-03-05 DIAGNOSIS — R600 Localized edema: Secondary | ICD-10-CM | POA: Diagnosis not present

## 2022-03-05 DIAGNOSIS — Z79899 Other long term (current) drug therapy: Secondary | ICD-10-CM

## 2022-03-05 DIAGNOSIS — K565 Intestinal adhesions [bands], unspecified as to partial versus complete obstruction: Secondary | ICD-10-CM | POA: Diagnosis not present

## 2022-03-05 DIAGNOSIS — R109 Unspecified abdominal pain: Secondary | ICD-10-CM | POA: Diagnosis not present

## 2022-03-05 DIAGNOSIS — B9620 Unspecified Escherichia coli [E. coli] as the cause of diseases classified elsewhere: Secondary | ICD-10-CM | POA: Diagnosis not present

## 2022-03-05 DIAGNOSIS — Z880 Allergy status to penicillin: Secondary | ICD-10-CM

## 2022-03-05 DIAGNOSIS — Z9989 Dependence on other enabling machines and devices: Secondary | ICD-10-CM | POA: Diagnosis not present

## 2022-03-05 DIAGNOSIS — R0602 Shortness of breath: Secondary | ICD-10-CM | POA: Diagnosis not present

## 2022-03-05 DIAGNOSIS — R0682 Tachypnea, not elsewhere classified: Secondary | ICD-10-CM | POA: Diagnosis not present

## 2022-03-05 DIAGNOSIS — Z91048 Other nonmedicinal substance allergy status: Secondary | ICD-10-CM

## 2022-03-05 DIAGNOSIS — E559 Vitamin D deficiency, unspecified: Secondary | ICD-10-CM | POA: Diagnosis not present

## 2022-03-05 DIAGNOSIS — K5669 Other partial intestinal obstruction: Secondary | ICD-10-CM | POA: Diagnosis not present

## 2022-03-05 DIAGNOSIS — J439 Emphysema, unspecified: Secondary | ICD-10-CM | POA: Diagnosis not present

## 2022-03-05 LAB — URINALYSIS, ROUTINE W REFLEX MICROSCOPIC
Bilirubin Urine: NEGATIVE
Glucose, UA: NEGATIVE mg/dL
Hgb urine dipstick: NEGATIVE
Ketones, ur: 5 mg/dL — AB
Leukocytes,Ua: NEGATIVE
Nitrite: NEGATIVE
Protein, ur: NEGATIVE mg/dL
Specific Gravity, Urine: >1.046 — ABNORMAL HIGH (ref 1.005–1.030)
pH: 5 (ref 5.0–8.0)

## 2022-03-05 LAB — COMPREHENSIVE METABOLIC PANEL
ALT: 16 U/L (ref 0–44)
AST: 22 U/L (ref 15–41)
Albumin: 3.7 g/dL (ref 3.5–5.0)
Alkaline Phosphatase: 78 U/L (ref 38–126)
Anion gap: 15 (ref 5–15)
BUN: 14 mg/dL (ref 6–20)
CO2: 25 mmol/L (ref 22–32)
Calcium: 9.5 mg/dL (ref 8.9–10.3)
Chloride: 102 mmol/L (ref 98–111)
Creatinine, Ser: 0.91 mg/dL (ref 0.44–1.00)
GFR, Estimated: >60 mL/min (ref >60)
Glucose, Bld: 125 mg/dL — ABNORMAL HIGH (ref 70–99)
Potassium: 3.5 mmol/L (ref 3.5–5.1)
Sodium: 142 mmol/L (ref 135–145)
Total Bilirubin: 1.1 mg/dL (ref 0.3–1.2)
Total Protein: 7 g/dL (ref 6.5–8.1)

## 2022-03-05 LAB — CBC
HCT: 46.6 % — ABNORMAL HIGH (ref 36.0–46.0)
Hemoglobin: 15.5 g/dL — ABNORMAL HIGH (ref 12.0–15.0)
MCH: 31.1 pg (ref 26.0–34.0)
MCHC: 33.3 g/dL (ref 30.0–36.0)
MCV: 93.6 fL (ref 80.0–100.0)
Platelets: 274 10*3/uL (ref 150–400)
RBC: 4.98 MIL/uL (ref 3.87–5.11)
RDW: 13.5 % (ref 11.5–15.5)
WBC: 8.5 10*3/uL (ref 4.0–10.5)
nRBC: 0 % (ref 0.0–0.2)

## 2022-03-05 LAB — LIPASE, BLOOD: Lipase: 24 U/L (ref 11–51)

## 2022-03-05 MED ORDER — IOHEXOL 300 MG/ML  SOLN
80.0000 mL | Freq: Once | INTRAMUSCULAR | Status: AC | PRN
  Administered 2022-03-05: 80 mL via INTRAVENOUS

## 2022-03-05 MED ORDER — ALBUTEROL SULFATE (2.5 MG/3ML) 0.083% IN NEBU
2.5000 mg | INHALATION_SOLUTION | RESPIRATORY_TRACT | Status: DC | PRN
  Administered 2022-03-21: 2.5 mg via RESPIRATORY_TRACT
  Filled 2022-03-05: qty 3

## 2022-03-05 MED ORDER — SENNOSIDES-DOCUSATE SODIUM 8.6-50 MG PO TABS: 1.0000 | ORAL_TABLET | Freq: Every evening | ORAL | Status: DC | PRN

## 2022-03-05 MED ORDER — HYDROMORPHONE HCL 1 MG/ML IJ SOLN
0.5000 mg | Freq: Once | INTRAMUSCULAR | Status: AC
  Administered 2022-03-05: 0.5 mg via INTRAVENOUS
  Filled 2022-03-05: qty 1

## 2022-03-05 MED ORDER — ONDANSETRON HCL 4 MG/2ML IJ SOLN: 4.0000 mg | Freq: Four times a day (QID) | INTRAMUSCULAR | Status: DC | PRN

## 2022-03-05 MED ORDER — HYDROMORPHONE HCL 1 MG/ML IJ SOLN
0.5000 mg | Freq: Four times a day (QID) | INTRAMUSCULAR | Status: DC | PRN
  Administered 2022-03-05 – 2022-03-09 (×5): 0.5 mg via INTRAVENOUS
  Filled 2022-03-05: qty 0.5
  Filled 2022-03-05 (×4): qty 1

## 2022-03-05 MED ORDER — ENOXAPARIN SODIUM 40 MG/0.4ML IJ SOSY
40.0000 mg | PREFILLED_SYRINGE | INTRAMUSCULAR | Status: DC
  Administered 2022-03-05 – 2022-03-06 (×2): 40 mg via SUBCUTANEOUS
  Filled 2022-03-05 (×2): qty 0.4

## 2022-03-05 MED ORDER — ONDANSETRON HCL 4 MG/2ML IJ SOLN
4.0000 mg | Freq: Once | INTRAMUSCULAR | Status: AC
  Administered 2022-03-05: 4 mg via INTRAVENOUS
  Filled 2022-03-05: qty 2

## 2022-03-05 MED ORDER — SODIUM CHLORIDE 0.9 % IV BOLUS
500.0000 mL | Freq: Once | INTRAVENOUS | Status: AC
  Administered 2022-03-05: 500 mL via INTRAVENOUS

## 2022-03-05 MED ORDER — ACETAMINOPHEN 650 MG RE SUPP: 650.0000 mg | Freq: Four times a day (QID) | RECTAL | Status: DC | PRN

## 2022-03-05 MED ORDER — ACETAMINOPHEN 325 MG PO TABS
650.0000 mg | ORAL_TABLET | Freq: Four times a day (QID) | ORAL | Status: DC | PRN
  Administered 2022-03-06: 650 mg via ORAL
  Filled 2022-03-05: qty 2

## 2022-03-05 MED ORDER — ONDANSETRON HCL 4 MG PO TABS: 4.0000 mg | ORAL_TABLET | Freq: Four times a day (QID) | ORAL | Status: DC | PRN

## 2022-03-05 MED ORDER — SODIUM CHLORIDE 0.9 % IV SOLN: INTRAVENOUS | Status: DC

## 2022-03-05 NOTE — ED Notes (Signed)
IV attempted x2- unsuccessful. 

## 2022-03-05 NOTE — ED Provider Notes (Signed)
Signout from [place] PA-C at shift change. Briefly, patient presents for abdominal pain and nausea.  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    Plan: CT abdomen and pelvis.   4:39 PM Reassessment performed. Patient appears somewhat uncomfortable but no vomiting.  Reports waves of pain every several minutes.  She "spit up" a few minutes ago.  Labs and imaging personally reviewed and interpreted including: CBC with white blood cell count 8.5, hemoglobin 15.5 mildly elevated; CMP glucose 125 otherwise unremarkable; lipase normal.  CT of the abdomen pelvis demonstrates large anterior diaphragmatic hernia resulting in small bowel obstruction.   Reviewed additional pertinent lab work and imaging with patient and caregiver at bedside including: CT findings   Most current vital signs reviewed and are as follows: BP 127/88   Pulse 74   Temp 98.2 F (36.8 C) (Oral)   Resp 18   Ht 4\' 8"  (1.422 m)   Wt 79.4 kg   SpO2 98%   BMI 39.23 kg/m   Plan: Patient will need admission, surgical consultation.  NG tube ordered.  Will treat pain and nausea, initiate IV fluids.  4:57 PM Consulted with Dr. with general surgery by telephone. She will consult. Requests NG tube and hospitalist admission.   5:27 PM Discussed with IMTS resident who will see for admission.       Freida Busman, PA-C 02/28/2022 1727    [place], 03/07/22, MD 02/20/2022 2236

## 2022-03-05 NOTE — H&P (Signed)
Date: 03/07/2022              Patient Name:  Barbara Ward MRN: [id number]  DOB: Mar 29, 1971 Age / Sex: 51 y.o., female   PCP: Hurshel Party, NP         Medical Service: Internal Medicine Teaching Service         Attending Physician: Dr. Earl Lagos, MD    First Contact: Dr. Benito Mccreedy Pager: [phone]  Second Contact: Dr. Kirke Corin Pager: [phone]       After Hours (After 5p/  First Contact Pager: [phone]  weekends / holidays): Second Contact Pager: [phone]   Chief Complaint: Abdominal pain  History of Present Illness: Abdominal pain  Patient reports she thinks she has another hernia. She started having abd pain around 1 am yesterday and woke her up from sleep. The pain is constant.  It feels similar to the pain she felt prior to her previous surgery for hernia.  It prevents her from lying flat.  Per coordinator, no one was made aware of patient's pain until this morning. She when she ate breakfast, she almost passed out but was able to keep it down.  Staff at the patient's group home called her mother who transported her to the ED by POV.  Had a small BM today and had some last night. She reports some nausea, hot flashes.  She also notes some new redness around the site of her laparoscopy incision from 2019.  She denies fevers, chills.  ED course: Laboratory work-up largely unremarkable.  No leukocytosis or electrolyte derangements.  CT A/P showed large diaphragmatic hernia with evidence of small bowel obstruction.  No evidence of bowel ischemia.  Surgery consulted.  Zofran and Dilaudid administered for symptomatic control. NS 500 mL bolus.  Meds:  Current Meds  Medication Sig   albuterol (VENTOLIN HFA) 108 (90 Base) MCG/ACT inhaler Inhale 2 puffs into the lungs every 6 (six) hours as needed for wheezing or shortness of breath.   ALPRAZolam (XANAX) 0.25 MG tablet Take 0.25 mg by mouth every 12 (twelve) hours.   atorvastatin (LIPITOR) 40 MG tablet Take 1 tablet (40 mg  total) by mouth daily at 6 PM.   busPIRone (BUSPAR) 15 MG tablet Take 15 mg by mouth 2 (two) times daily.   busPIRone (BUSPAR) 15 MG tablet Take 15 mg by mouth 3 (three) times daily.   Cholecalciferol (VITAMIN D3) 50 MCG (2000 UT) capsule Take 2,000 Units by mouth daily.   citalopram (CELEXA) 40 MG tablet Take 40 mg by mouth daily.   docusate sodium (COLACE) 100 MG capsule Take 100 mg by mouth every evening.   fluticasone (FLONASE) 50 MCG/ACT nasal spray Place 1 spray into both nostrils every evening.    furosemide (LASIX) 40 MG tablet Take 40 mg by mouth daily.   glucosamine-chondroitin 500-400 MG tablet Take 1 tablet by mouth every morning.   levothyroxine (SYNTHROID, LEVOTHROID) 137 MCG tablet Take 137 mcg by mouth daily before breakfast.   loratadine (CLARITIN) 10 MG tablet Take 10 mg by mouth every morning.   Multiple Vitamin (MULTIVITAMIN WITH MINERALS) TABS tablet Take 1 tablet by mouth every morning.   potassium chloride (K-DUR) 10 MEQ tablet Take 10 mEq by mouth every morning.   PSYLLIUM FIBER PO Take 1 tablet by mouth in the morning and at bedtime.   ramipril (ALTACE) 10 MG capsule Take 10 mg by mouth daily.   Zinc Oxide (DESITIN RAPID RELIEF) 13 % CREA Apply 1 Application  topically daily as needed (Apply to buttock and genital area after using restroom).     Allergies: Allergies as of 2022-03-28 - Review Complete 03/28/2022  Allergen Reaction Noted   Codeine Other (See Comments) 02/18/2014   Penicillins Other (See Comments) 02/18/2014   Tape Other (See Comments) 02/18/2014   Past Medical History:  Diagnosis Date   Allergic rhinitis    Hyperlipidemia    Hypokalemia    MR (mental retardation)    Prader-Willi syndrome    Sleep apnea    Thyroid disease     Family History: Father with history of hiatal hernia  Social History:  Lives in group home since 2011. No ETOH, tobacco or illicit use.   Review of Systems: A complete ROS was negative except as per HPI.    Physical Exam: Blood pressure 104/64, pulse 94, temperature 98.2 F (36.8 C), temperature source Oral, resp. rate 20, height 4\' 8"  (1.422 m), weight 79.4 kg, SpO2 93 %. General: Female sitting upright.  Apparent intermittent discomfort. Cardiovascular: Regular rate and rhythm. Respiratory: Clear to auscultation bilaterally.  Normal work of breathing noted. Abdominal: Diffuse abdominal tenderness.  Soft.  No rebound tenderness. Extremities: Left radial pulse 2+. Skin: Erythema around site of old laparoscopy incision. Neuro: Alert and oriented. Psych: Appropriate mood and affect.  Labs: UA negative No leukocytosis BMP normal Lipase normal  CT A/P w contrast: 1. Large anterior diaphragmatic hernia, markedly increased since prior study. Significant portions of large and small bowel extend into the diaphragmatic hernia, with segmental small-bowel obstruction within the hernia sac as well as within the right mid abdomen as above. No evidence of bowel ischemia. 2. Distension of the stomach, which is normally located in the left upper quadrant, likely as result of herniation of the duodenum described above. Decompression with enteric catheter may be useful.  Assessment & Plan  [name] is a 51 y.o. with a PMH of diaphragmatic hernia, Prader-Willi syndrome, developmental delay, who presented with abdominal pain and nausea and was admitted for small bowel obstruction secondary to diaphragmatic hernia.  Principal Problem:   Small bowel obstruction (HCC)  Small bowel obstruction Diaphragmatic hernia We will attempt NG tube decompression.  Patient had 2 small bowel movements as recently as this morning.  She continues to pass gas.  Mother reports significant respiratory depression after treatment with pain medicine during last episode.  Will need to be cautious with opioid pain medicine.  Surgery consulted by ED.  Appreciate their input.  No indication for emergent surgery at this  time. - Serial abdominal examinations. - N.p.o. and hold home oral meds. - NG tube to low suction - Start NS 125 mL/h IV infusion - Continue hydromorphone .5 mg IV every 6 hours as needed for severe pain. - Continue Zofran 4 mg p.o. every 6 hours as needed for nausea with IV backup. - Continue Tylenol 650 mg p.o./rectal every 6 hours as needed for mild pain or fever.  Hypertension - Hold home ramipril.  Hyperlipidemia - Hold home atorvastatin.  Hypothyroidism - Hold home Synthroid.  Vitamin D deficiency - Hold home cholecalciferol.  Lymphedema - Hold home Lasix.  Anxiety Holding home meds due to n.p.o. status.  Patient is taking a lot of psychotropics at home.  We will restart as soon as we are able.  VTE prophylaxis - Lovenox  Advance care planning Patient and mother wish for full scope of care including CPR, intubation, ICU level treatment.  Mother is legal guardian and medical power of attorney.  Dispo: Admit patient to Observation with expected length of stay less than 2 midnights.  Signed: Marrianne Mood, MD 02/22/2022, 8:25 PM  Pager: [phone] After 5pm on weekdays and 1pm on weekends: On Call pager: [phone]

## 2022-03-05 NOTE — ED Notes (Signed)
General surgery was messaged about unsuccessful attempt of inserting NG tube by previous nurse. Awaiting reply and new orders.

## 2022-03-05 NOTE — ED Notes (Signed)
Admitting provider at bedside.

## 2022-03-05 NOTE — ED Triage Notes (Signed)
Pt arrived POV from home c/o generalized abdominal pain that started last night. Pt denies any N/V/D.

## 2022-03-05 NOTE — ED Provider Notes (Addendum)
[facility] EMERGENCY DEPARTMENT Provider Note   CSN: [id number] Arrival date & time: 02/23/2022  1032     History  Chief Complaint  Patient presents with   Abdominal Pain    Barbara Ward is a 51 y.o. female.  Pt complains of abdominal pain since this am. Pt complains of nausea and vomiting.  Pt reports similar pain in the past with a hernia.  Pt nauseated. No vomiting, no diarrhea.  No fever or chills   The history is provided by the patient. No language interpreter was used.  Abdominal Pain Pain location:  Generalized Pain quality: aching   Pain radiates to:  Does not radiate Pain severity:  Moderate Onset quality:  Sudden Duration:  1 day Progression:  Worsening Chronicity:  New Context: previous surgery   Context: not alcohol use   Relieved by:  Nothing Worsened by:  Nothing Ineffective treatments:  None tried Associated symptoms: no fever        Home Medications Prior to Admission medications   Medication Sig Start Date End Date Taking? Authorizing Provider  atorvastatin (LIPITOR) 40 MG tablet Take 1 tablet (40 mg total) by mouth daily at 6 PM. 03/06/18   Riccio, Marcell Anger, DO  busPIRone (BUSPAR) 5 MG tablet Take 5 mg by mouth 2 (two) times daily.    [provider]  Calcium Polycarbophil (FIBER-CAPS PO) Take 1 capsule by mouth 2 (two) times daily.    [provider]  citalopram (CELEXA) 40 MG tablet Take 40 mg by mouth daily.    [provider]  docusate sodium (COLACE) 100 MG capsule Take 100 mg by mouth every evening.    [provider]  fluticasone (FLONASE) 50 MCG/ACT nasal spray Place 1 spray into both nostrils every evening.     [provider]  furosemide (LASIX) 40 MG tablet Take 40-80 mg by mouth 2 (two) times daily. Take 2 tabs (80mg ) in the morning, and 1 tab (40mg ) in the evening.    [provider]  glucosamine-chondroitin 500-400 MG tablet Take 1 tablet by mouth every morning.     [provider]  levothyroxine (SYNTHROID, LEVOTHROID) 137 MCG tablet Take 137 mcg by mouth daily before breakfast.    [provider]  loratadine (CLARITIN) 10 MG tablet Take 10 mg by mouth every morning.    [provider]  Multiple Vitamin (MULTIVITAMIN WITH MINERALS) TABS tablet Take 1 tablet by mouth every morning.    [provider]  potassium chloride (K-DUR) 10 MEQ tablet Take 10 mEq by mouth every morning.    [provider]      Allergies    Codeine, Penicillins, and Tape    Review of Systems   Review of Systems  Constitutional:  Negative for fever.  Gastrointestinal:  Positive for abdominal pain.  All other systems reviewed and are negative.   Physical Exam Updated Vital Signs BP (!) 135/105 (BP Location: Right Wrist)   Pulse 77   Temp 98 F (36.7 C) (Oral)   Resp 20   Ht 4\' 8"  (1.422 m)   Wt 79.4 kg   SpO2 95%   BMI 39.23 kg/m  Physical Exam Vitals and nursing note reviewed.  Constitutional:      Appearance: She is well-developed.  HENT:     Head: Normocephalic.  Cardiovascular:     Rate and Rhythm: Normal rate and regular rhythm.  Pulmonary:     Effort: Pulmonary effort is normal.  Abdominal:  General: Abdomen is flat. There is no distension.     Palpations: Abdomen is soft.     Tenderness: There is abdominal tenderness in the right upper quadrant.  Musculoskeletal:        General: Normal range of motion.     Cervical back: Normal range of motion.  Skin:    General: Skin is warm.  Neurological:     General: No focal deficit present.     Mental Status: She is alert and oriented to person, place, and time.     ED Results / Procedures / Treatments   Labs (all labs ordered are listed, but only abnormal results are displayed) Labs Reviewed  COMPREHENSIVE METABOLIC PANEL - Abnormal; Notable for the following components:      Result Value   Glucose, Bld 125 (*)    All other components within normal  limits  CBC - Abnormal; Notable for the following components:   Hemoglobin 15.5 (*)    HCT 46.6 (*)    All other components within normal limits  LIPASE, BLOOD  URINALYSIS, ROUTINE W REFLEX MICROSCOPIC    EKG None  Radiology No results found.  Procedures Procedures    Medications Ordered in ED Medications - No data to display  ED Course/ Medical Decision Making/ A&P                           Medical Decision Making Amount and/or Complexity of Data Reviewed Independent Historian: parent    Details: Family reports pt has been complaining of abdominal pain External Data Reviewed: notes.    Details: primary care notes reviewed Labs: ordered. Decision-making details documented in ED Course.    Details: labs ordered reviewed and interpreted  no elevation in wbc ount   glucose 125 Radiology: ordered.   MDM:  Labs and ct scan ordered  Pt's care turned over to [facility] geiple PA         Final Clinical Impression(s) / ED Diagnoses Final diagnoses:  Generalized abdominal pain    Rx / DC Orders ED Discharge Orders     None         Osie Cheeks 03/12/2022 1327    Elson Areas, PA-C 03/19/2022 1508    Elson Areas, [place] 03/16/2022 1534    Terald Sleeper, MD 03/13/2022 [phone]

## 2022-03-05 NOTE — ED Notes (Signed)
RN attempted to place NG tube unsuccessfully. Pt refused another attempt. Kinsinger MD made aware

## 2022-03-05 NOTE — ED Notes (Addendum)
Internal medicine provider messaged about unsuccessful attempt of NG tube after receiving no new orders or answer from general surgery. Message was seen by general surgery.   Awaiting for reeval by internal medicine.   2354  Per internal medicine "Hold off NG tube for right now. Let us know if vitals signs become unstable".

## 2022-03-05 NOTE — ED Notes (Signed)
Pt SpO2 decreased tp 8% on RA after receiving 0.5mg  dilaudid, RN placed pt on 3L O2 via nasal cannula, SpO2 increased to 93%

## 2022-03-05 NOTE — ED Notes (Signed)
Patient and mother aware she needs a urine spec. However mother states patient went to [facility] just after arriving to ed and now doesn't have to go

## 2022-03-05 NOTE — Consult Note (Signed)
Barbara Ward 1970/12/29  [id number].    Requesting MD: Renne Crigler, PA-C Chief Complaint/Reason for Consult: SBO, recurrent diaphragmatic hernia  HPI:  Barbara Ward is a 51 yo female with a history of prior diaphragmatic hernia repair, who presented to the ED today with abdominal pain that began earlier today. She denies vomiting but has had nausea. She has been having bowel movements, her most recent was this morning. Labs in the ED are unremarkable and she is hemodynamically stable. A CT scan showed a recurrent diaphragmatic hernia containing small bowel and a portion of colon, with an associated small bowel obstruction. General surgery was consulted.  The patient's previous repair was done laparoscopically by Dr. Ezzard Standing in 2019, and a G tube was placed at that time. Her family reports the G tube was removed that same year. She has not had any recurrent symptoms before today. She has not had any prior abdominal surgeries. She has Prader-Willi syndrome.  ROS: Review of Systems  Constitutional:  Negative for chills and fever.  Respiratory:  Negative for stridor.   Cardiovascular:  Negative for chest pain.  Gastrointestinal:  Positive for abdominal pain and nausea. Negative for constipation and vomiting.  Neurological:  Negative for loss of consciousness and weakness.    History reviewed. No pertinent family history.  Past Medical History:  Diagnosis Date   Allergic rhinitis    Hyperlipidemia    Hypokalemia    MR (mental retardation)    Prader-Willi syndrome    Sleep apnea    Thyroid disease     Past Surgical History:  Procedure Laterality Date   ESOPHAGOGASTRODUODENOSCOPY (EGD) WITH PROPOFOL N/A 02/25/2018   Procedure: ESOPHAGOGASTRODUODENOSCOPY (EGD) WITH PROPOFOL;  Surgeon: Kathi Der, MD;  Location: [facility] ENDOSCOPY;  Service: Gastroenterology;  Laterality: N/A;   HERNIA REPAIR     HIATAL HERNIA REPAIR N/A 02/27/2018   Procedure: LAPAROSCOPIC REPAIR OF HIATAL HERNIA;   Surgeon: Ovidio Kin, MD;  Location: [facility] OR;  Service: General;  Laterality: N/A;   LAPAROSCOPIC GASTROSTOMY N/A 02/27/2018   Procedure: LAPAROSCOPIC GASTROSTOMY;  Surgeon: Ovidio Kin, MD;  Location: [facility] OR;  Service: General;  Laterality: N/A;   THYROIDECTOMY      Social History:  reports that she has never smoked. She has never used smokeless tobacco. She reports that she does not drink alcohol and does not use drugs.  Allergies:  Allergies  Allergen Reactions   Codeine Other (See Comments)    Unknown reaction   Penicillins Other (See Comments)    Unknown reaction   Tape Other (See Comments)    "surgical tape" - unknown reaction    (Not in a hospital admission)    Physical Exam: Blood pressure 104/64, pulse 94, temperature 98.2 F (36.8 C), temperature source Oral, resp. rate 20, height 4\' 8"  (1.422 m), weight 79.4 kg, SpO2 93 %. General: resting comfortably, NAD Neuro: alert and oriented Resp: normal work of breathing on room air Abdomen: soft, nondistended, nontender, well-healed port site scars. Extremities: warm and well-perfused  Results for orders placed or performed during the hospital encounter of 2022-03-14 (from the past 48 hour(s))  Lipase, blood     Status: None   Collection Time: 03-14-22 11:08 AM  Result Value Ref Range   Lipase 24 11 - 51 U/L    Comment: Performed at [facility], [address], [place], [place]  Comprehensive metabolic panel     Status: Abnormal   Collection Time: 03/14/2022 11:08 AM  Result Value Ref  Range   Sodium 142 135 - 145 mmol/L   Potassium 3.5 3.5 - 5.1 mmol/L   Chloride 102 98 - 111 mmol/L   CO2 25 22 - 32 mmol/L   Glucose, Bld 125 (H) 70 - 99 mg/dL    Comment: Glucose reference range applies only to samples taken after fasting for at least 8 hours.   BUN 14 6 - 20 mg/dL   Creatinine, Ser 6.21 0.44 - 1.00 mg/dL   Calcium 9.5 8.9 - 30.8 mg/dL   Total Protein 7.0 6.5 - 8.1 g/dL   Albumin 3.7 3.5 - 5.0 g/dL    AST 22 15 - 41 U/L   ALT 16 0 - 44 U/L   Alkaline Phosphatase 78 38 - 126 U/L   Total Bilirubin 1.1 0.3 - 1.2 mg/dL   GFR, Estimated >65 >78 mL/min    Comment: (NOTE) Calculated using the CKD-EPI Creatinine Equation (2021)    Anion gap 15 5 - 15    Comment: Performed at [facility], [address], [place], [place] [address]  CBC     Status: Abnormal   Collection Time: 03/01/2022 11:08 AM  Result Value Ref Range   WBC 8.5 4.0 - 10.5 K/uL   RBC 4.98 3.87 - 5.11 MIL/uL   Hemoglobin 15.5 (H) 12.0 - 15.0 g/dL   HCT 95.2 (H) 84.1 - 32.4 %   MCV 93.6 80.0 - 100.0 fL   MCH 31.1 26.0 - 34.0 pg   MCHC 33.3 30.0 - 36.0 g/dL   RDW 40.1 02.7 - 25.3 %   Platelets 274 150 - 400 K/uL   nRBC 0.0 0.0 - 0.2 %    Comment: Performed at [facility], [address], [place], [place] [address]  Urinalysis, Routine w reflex microscopic Urine, Clean Catch     Status: Abnormal   Collection Time: 03/02/2022  7:32 PM  Result Value Ref Range   Color, Urine YELLOW YELLOW   APPearance CLEAR CLEAR   Specific Gravity, Urine >1.046 (H) 1.005 - 1.030   pH 5.0 5.0 - 8.0   Glucose, UA NEGATIVE NEGATIVE mg/dL   Hgb urine dipstick NEGATIVE NEGATIVE   Bilirubin Urine NEGATIVE NEGATIVE   Ketones, ur 5 (A) NEGATIVE mg/dL   Protein, ur NEGATIVE NEGATIVE mg/dL   Nitrite NEGATIVE NEGATIVE   Leukocytes,Ua NEGATIVE NEGATIVE    Comment: Performed at [facility], [address], [place], [place] [address]   CT ABDOMEN PELVIS W CONTRAST  Result Date: 02/21/2022 CLINICAL DATA:  Generalized abdominal pain since yesterday EXAM: CT ABDOMEN AND PELVIS WITH CONTRAST TECHNIQUE: Multidetector CT imaging of the abdomen and pelvis was performed using the standard protocol following bolus administration of intravenous contrast. RADIATION DOSE REDUCTION: This exam was performed according to the departmental dose-optimization program which includes automated exposure control, adjustment of the mA and/or kV according to  patient size and/or use of iterative reconstruction technique. CONTRAST:  5mL OMNIPAQUE IOHEXOL 300 MG/ML  SOLN COMPARISON:  02/23/2018 FINDINGS: Lower chest: Since the prior exam, and anterior diaphragmatic hernia has enlarged, now containing numerous loops of small bowel as well as transverse colon. No acute airspace disease, effusion, or pneumothorax. Hepatobiliary: No focal liver abnormality is seen. No gallstones, gallbladder wall thickening, or biliary dilatation. Pancreas: Unremarkable. No pancreatic ductal dilatation or surrounding inflammatory changes. Spleen: Normal in size without focal abnormality. Adrenals/Urinary Tract: Adrenal glands are unremarkable. Kidneys are normal, without renal calculi, focal lesion, or hydronephrosis. Bladder is unremarkable. Stomach/Bowel: As  discussed above, there is a large anterior diaphragmatic hernia, extending anterior to the heart and occupying a significant portion of the right anterior hemithorax. This diaphragmatic hernia contains large portions of the proximal small bowel extending from the duodenum through at least the mid jejunum. Portions of the transverse colon are also contained within the hernia sac. There are multiple dilated loops of small bowel within the hernia sac, measuring up to 3.9 cm in diameter, with numerous gas fluid levels, consistent with obstruction. Within the right hemiabdomen, separate from the hernia sac, there are additional dilated loops of distal jejunum or ileum measuring up to 2.9 cm in diameter, representing a second segment of small bowel obstruction. The stomach, normally located below the left hemidiaphragm, is also distended. Decompression with enteric catheter may be useful. I do not see any bowel wall thickening or inflammatory change. The appendix cannot be identified. Vascular/Lymphatic: No significant vascular findings are present. No enlarged abdominal or pelvic lymph nodes. Reproductive: Uterus and bilateral adnexa are  unremarkable. Other: As discussed above, there is a large anterior diaphragmatic hernia. The defect within the anterior aspect of the diaphragm measures approximately 5.2 cm in AP dimension and at least 6.5 cm in transverse dimension. There is no free fluid or free intraperitoneal gas. Musculoskeletal: No acute or destructive bony lesions. Reconstructed images demonstrate no additional findings. IMPRESSION: 1. Large anterior diaphragmatic hernia, markedly increased since prior study. Significant portions of large and small bowel extend into the diaphragmatic hernia, with segmental small-bowel obstruction within the hernia sac as well as within the right mid abdomen as above. No evidence of bowel ischemia. 2. Distension of the stomach, which is normally located in the left upper quadrant, likely as result of herniation of the duodenum described above. Decompression with enteric catheter may be useful. Electronically Signed   By: Randa Ngo M.D.   On: 03/15/2022 16:26      Assessment/Plan This is a 51 yo female with Prader-Willi syndrome and prior diaphragmatic hernia repair who presents with acute abdominal pain. CT scan shows a recurrent anterior diaphragmatic hernia, containing multiple loops of small bowel with air-fluid levels. There is also gastric distension. Clinically the patient's abdominal exam is benign and labs are unremarkable; my suspicion for strangulated bowel is low. Recommend NG decompression for now, but her hernia will ultimately need to be repaired, which I discussed with her and her family. - Place NG tube to low intermittent suction - NPO, IV fluid hydration - No indication for emergent surgery unless patient develops signs of strangulation. Will likely plan to repair this week, but will discuss this further with [facility]. I discussed this plan with the patient and her family, who were present at bedside.  Michaelle Birks, [facility] General, Hepatobiliary and  Pancreatic Surgery 03/09/2022 8:13 PM

## 2022-03-05 NOTE — ED Notes (Signed)
Patient ambulated to restroom with assist of walker. No needs or concerns expressed at this time.

## 2022-03-06 DIAGNOSIS — E559 Vitamin D deficiency, unspecified: Secondary | ICD-10-CM | POA: Diagnosis not present

## 2022-03-06 DIAGNOSIS — J982 Interstitial emphysema: Secondary | ICD-10-CM | POA: Diagnosis not present

## 2022-03-06 DIAGNOSIS — L89322 Pressure ulcer of left buttock, stage 2: Secondary | ICD-10-CM | POA: Diagnosis not present

## 2022-03-06 DIAGNOSIS — Z9989 Dependence on other enabling machines and devices: Secondary | ICD-10-CM | POA: Diagnosis not present

## 2022-03-06 DIAGNOSIS — K449 Diaphragmatic hernia without obstruction or gangrene: Secondary | ICD-10-CM | POA: Diagnosis not present

## 2022-03-06 DIAGNOSIS — E890 Postprocedural hypothyroidism: Secondary | ICD-10-CM | POA: Diagnosis present

## 2022-03-06 DIAGNOSIS — I959 Hypotension, unspecified: Secondary | ICD-10-CM | POA: Diagnosis not present

## 2022-03-06 DIAGNOSIS — Z452 Encounter for adjustment and management of vascular access device: Secondary | ICD-10-CM | POA: Diagnosis not present

## 2022-03-06 DIAGNOSIS — R34 Anuria and oliguria: Secondary | ICD-10-CM | POA: Diagnosis not present

## 2022-03-06 DIAGNOSIS — W19XXXA Unspecified fall, initial encounter: Secondary | ICD-10-CM | POA: Diagnosis not present

## 2022-03-06 DIAGNOSIS — J9602 Acute respiratory failure with hypercapnia: Secondary | ICD-10-CM | POA: Diagnosis not present

## 2022-03-06 DIAGNOSIS — Z66 Do not resuscitate: Secondary | ICD-10-CM | POA: Diagnosis not present

## 2022-03-06 DIAGNOSIS — D649 Anemia, unspecified: Secondary | ICD-10-CM | POA: Diagnosis not present

## 2022-03-06 DIAGNOSIS — N3000 Acute cystitis without hematuria: Secondary | ICD-10-CM | POA: Diagnosis not present

## 2022-03-06 DIAGNOSIS — I248 Other forms of acute ischemic heart disease: Secondary | ICD-10-CM | POA: Diagnosis not present

## 2022-03-06 DIAGNOSIS — J9859 Other diseases of mediastinum, not elsewhere classified: Secondary | ICD-10-CM | POA: Diagnosis present

## 2022-03-06 DIAGNOSIS — E785 Hyperlipidemia, unspecified: Secondary | ICD-10-CM | POA: Diagnosis not present

## 2022-03-06 DIAGNOSIS — D72829 Elevated white blood cell count, unspecified: Secondary | ICD-10-CM | POA: Diagnosis not present

## 2022-03-06 DIAGNOSIS — D62 Acute posthemorrhagic anemia: Secondary | ICD-10-CM | POA: Diagnosis not present

## 2022-03-06 DIAGNOSIS — Q8711 Prader-Willi syndrome: Secondary | ICD-10-CM | POA: Diagnosis not present

## 2022-03-06 DIAGNOSIS — R1084 Generalized abdominal pain: Secondary | ICD-10-CM | POA: Diagnosis not present

## 2022-03-06 DIAGNOSIS — N390 Urinary tract infection, site not specified: Secondary | ICD-10-CM | POA: Diagnosis not present

## 2022-03-06 DIAGNOSIS — R571 Hypovolemic shock: Secondary | ICD-10-CM | POA: Diagnosis not present

## 2022-03-06 DIAGNOSIS — K469 Unspecified abdominal hernia without obstruction or gangrene: Secondary | ICD-10-CM | POA: Diagnosis not present

## 2022-03-06 DIAGNOSIS — K567 Ileus, unspecified: Secondary | ICD-10-CM | POA: Diagnosis not present

## 2022-03-06 DIAGNOSIS — E669 Obesity, unspecified: Secondary | ICD-10-CM | POA: Diagnosis present

## 2022-03-06 DIAGNOSIS — K56609 Unspecified intestinal obstruction, unspecified as to partial versus complete obstruction: Secondary | ICD-10-CM

## 2022-03-06 DIAGNOSIS — E039 Hypothyroidism, unspecified: Secondary | ICD-10-CM | POA: Diagnosis not present

## 2022-03-06 DIAGNOSIS — R0682 Tachypnea, not elsewhere classified: Secondary | ICD-10-CM | POA: Diagnosis not present

## 2022-03-06 DIAGNOSIS — E874 Mixed disorder of acid-base balance: Secondary | ICD-10-CM | POA: Diagnosis not present

## 2022-03-06 DIAGNOSIS — Z4682 Encounter for fitting and adjustment of non-vascular catheter: Secondary | ICD-10-CM | POA: Diagnosis not present

## 2022-03-06 DIAGNOSIS — R012 Other cardiac sounds: Secondary | ICD-10-CM | POA: Diagnosis not present

## 2022-03-06 DIAGNOSIS — G4733 Obstructive sleep apnea (adult) (pediatric): Secondary | ICD-10-CM | POA: Diagnosis not present

## 2022-03-06 DIAGNOSIS — J439 Emphysema, unspecified: Secondary | ICD-10-CM | POA: Diagnosis not present

## 2022-03-06 DIAGNOSIS — J95821 Acute postprocedural respiratory failure: Secondary | ICD-10-CM | POA: Diagnosis not present

## 2022-03-06 DIAGNOSIS — J90 Pleural effusion, not elsewhere classified: Secondary | ICD-10-CM | POA: Diagnosis not present

## 2022-03-06 DIAGNOSIS — K311 Adult hypertrophic pyloric stenosis: Secondary | ICD-10-CM | POA: Diagnosis present

## 2022-03-06 DIAGNOSIS — R6889 Other general symptoms and signs: Secondary | ICD-10-CM | POA: Diagnosis not present

## 2022-03-06 DIAGNOSIS — I10 Essential (primary) hypertension: Secondary | ICD-10-CM | POA: Diagnosis present

## 2022-03-06 DIAGNOSIS — R600 Localized edema: Secondary | ICD-10-CM | POA: Diagnosis not present

## 2022-03-06 DIAGNOSIS — K565 Intestinal adhesions [bands], unspecified as to partial versus complete obstruction: Secondary | ICD-10-CM | POA: Diagnosis present

## 2022-03-06 DIAGNOSIS — Z515 Encounter for palliative care: Secondary | ICD-10-CM | POA: Diagnosis not present

## 2022-03-06 DIAGNOSIS — L89312 Pressure ulcer of right buttock, stage 2: Secondary | ICD-10-CM | POA: Diagnosis not present

## 2022-03-06 DIAGNOSIS — R000 Tachycardia, unspecified: Secondary | ICD-10-CM | POA: Diagnosis not present

## 2022-03-06 DIAGNOSIS — R0602 Shortness of breath: Secondary | ICD-10-CM | POA: Diagnosis not present

## 2022-03-06 DIAGNOSIS — R109 Unspecified abdominal pain: Secondary | ICD-10-CM | POA: Diagnosis not present

## 2022-03-06 DIAGNOSIS — K5669 Other partial intestinal obstruction: Secondary | ICD-10-CM | POA: Diagnosis not present

## 2022-03-06 DIAGNOSIS — R0689 Other abnormalities of breathing: Secondary | ICD-10-CM | POA: Diagnosis not present

## 2022-03-06 DIAGNOSIS — Q430 Meckel's diverticulum (displaced) (hypertrophic): Secondary | ICD-10-CM | POA: Diagnosis not present

## 2022-03-06 DIAGNOSIS — I3139 Other pericardial effusion (noninflammatory): Secondary | ICD-10-CM | POA: Diagnosis not present

## 2022-03-06 DIAGNOSIS — K440 Diaphragmatic hernia with obstruction, without gangrene: Secondary | ICD-10-CM | POA: Diagnosis present

## 2022-03-06 DIAGNOSIS — Q790 Congenital diaphragmatic hernia: Secondary | ICD-10-CM | POA: Diagnosis not present

## 2022-03-06 DIAGNOSIS — J9811 Atelectasis: Secondary | ICD-10-CM | POA: Diagnosis not present

## 2022-03-06 DIAGNOSIS — Z6839 Body mass index (BMI) 39.0-39.9, adult: Secondary | ICD-10-CM | POA: Diagnosis not present

## 2022-03-06 LAB — BASIC METABOLIC PANEL
Anion gap: 10 (ref 5–15)
BUN: 10 mg/dL (ref 6–20)
CO2: 26 mmol/L (ref 22–32)
Calcium: 8.3 mg/dL — ABNORMAL LOW (ref 8.9–10.3)
Chloride: 103 mmol/L (ref 98–111)
Creatinine, Ser: 0.91 mg/dL (ref 0.44–1.00)
GFR, Estimated: >60 mL/min (ref >60)
Glucose, Bld: 100 mg/dL — ABNORMAL HIGH (ref 70–99)
Potassium: 3.5 mmol/L (ref 3.5–5.1)
Sodium: 139 mmol/L (ref 135–145)

## 2022-03-06 LAB — CBC
HCT: 42.8 % (ref 36.0–46.0)
Hemoglobin: 13.4 g/dL (ref 12.0–15.0)
MCH: 31.3 pg (ref 26.0–34.0)
MCHC: 31.3 g/dL (ref 30.0–36.0)
MCV: 100 fL (ref 80.0–100.0)
Platelets: 211 10*3/uL (ref 150–400)
RBC: 4.28 MIL/uL (ref 3.87–5.11)
RDW: 14 % (ref 11.5–15.5)
WBC: 7.2 10*3/uL (ref 4.0–10.5)
nRBC: 0 % (ref 0.0–0.2)

## 2022-03-06 LAB — MRSA NEXT GEN BY PCR, NASAL: MRSA by PCR Next Gen: NOT DETECTED

## 2022-03-06 LAB — HIV ANTIBODY (ROUTINE TESTING W REFLEX): HIV Screen 4th Generation wRfx: NONREACTIVE

## 2022-03-06 LAB — MAGNESIUM: Magnesium: 2.1 mg/dL (ref 1.7–2.4)

## 2022-03-06 LAB — PHOSPHORUS: Phosphorus: 2.7 mg/dL (ref 2.5–4.6)

## 2022-03-06 LAB — TSH: TSH: 0.474 u[IU]/mL (ref 0.350–4.500)

## 2022-03-06 MED ORDER — LORAZEPAM 2 MG/ML IJ SOLN
0.5000 mg | Freq: Four times a day (QID) | INTRAMUSCULAR | Status: DC | PRN
  Administered 2022-03-06: 0.5 mg via INTRAVENOUS
  Filled 2022-03-06: qty 1

## 2022-03-06 NOTE — Progress Notes (Signed)
   Subjective: Patient assessed at bedside after receiving page from the nurse after failed NG tube placement.  Patient was reluctant to reattempt NG tube placement.  She notes that she admitted medication to help her calm down of prior admission to place an NG tube.  Patient and patient's mother state that a another physician had told him earlier that it could wait until the morning due to her stable clinical state.  She denies any worsening abdominal pain and notes that it has mostly abated after getting pain medication.  Objective:  Vital signs in last 24 hours: Vitals:   03/18/2022 1430 03/13/2022 1432 03/11/2022 1659 03/10/2022 2220  BP: 127/88 127/88 104/64 (!) 131/54  Pulse: 74 74 94 91  Resp: 18  20 18   Temp: 98.2 F (36.8 C)   98.4 F (36.9 C)  TempSrc: Oral   Oral  SpO2: 97% 98% 93% 96%  Weight:      Height:         Assessment/Plan:  Principal Problem:   Small bowel obstruction (HCC)  After counseling the patient about therapeutic need for NG tube placement setting of small bowel obstruction, she refused reattempt at this time and requested that it be done at a later time.  With her stable clinical picture and lack of worsening abdominal pain we will hold off on further attempts to place an NG tube until after reassessing the patient in the morning.  , DO 03/06/2022, 2:15 AM Pager: [phone] After 5pm on weekdays and 1pm on weekends: On Call pager [phone]

## 2022-03-06 NOTE — Progress Notes (Signed)
Subjective: No acute complaints. Subjective feels better, denies pain. Unable to place NG yesterday. Reports some flatus but no BM.   Objective: Vital signs in last 24 hours: Temp:  [98.1 F (36.7 C)-98.4 F (36.9 C)] 98.1 F (36.7 C) (07/16 1429) Pulse Rate:  [83-94] 89 (07/16 1429) Resp:  [15-20] 16 (07/16 1429) BP: (104-131)/(53-64) 128/58 (07/16 1429) SpO2:  [93 %-99 %] 95 % (07/16 1429)    Intake/Output from previous day: 07/15 0701 - 07/16 0700 In: 4.8 [IV Piggyback:4.8] Out: -  Intake/Output this shift: No intake/output data recorded.  PE: General: resting comfortably, NAD Resp: normal work of breathing Abdomen: soft, nondistended, nontender to palpation.  Extremities: warm and well-perfused   Lab Results:  Recent Labs    03/04/2022 1108 03/06/22 0450  WBC 8.5 7.2  HGB 15.5* 13.4  HCT 46.6* 42.8  PLT 274 211   BMET Recent Labs    03/02/2022 1108 03/06/22 0552  NA 142 139  K 3.5 3.5  CL 102 103  CO2 25 26  GLUCOSE 125* 100*  BUN 14 10  CREATININE 0.91 0.91  CALCIUM 9.5 8.3*   PT/INR No results for input(s): "LABPROT", "INR" in the last 72 hours. CMP     Component Value Date/Time   NA 139 03/06/2022 0552   K 3.5 03/06/2022 0552   CL 103 03/06/2022 0552   CO2 26 03/06/2022 0552   GLUCOSE 100 (H) 03/06/2022 0552   BUN 10 03/06/2022 0552   CREATININE 0.91 03/06/2022 0552   CALCIUM 8.3 (L) 03/06/2022 0552   PROT 7.0 02/24/2022 1108   ALBUMIN 3.7 02/26/2022 1108   AST 22 02/28/2022 1108   ALT 16 02/20/2022 1108   ALKPHOS 78 03/09/2022 1108   BILITOT 1.1 03/12/2022 1108   GFRNONAA >60 03/06/2022 0552   GFRAA >60 03/04/2018 0427   Lipase     Component Value Date/Time   LIPASE 24 03/14/2022 1108       Studies/Results: CT ABDOMEN PELVIS W CONTRAST  Result Date: 03/18/2022 CLINICAL DATA:  Generalized abdominal pain since yesterday EXAM: CT ABDOMEN AND PELVIS WITH CONTRAST TECHNIQUE: Multidetector CT imaging of the abdomen and  pelvis was performed using the standard protocol following bolus administration of intravenous contrast. RADIATION DOSE REDUCTION: This exam was performed according to the departmental dose-optimization program which includes automated exposure control, adjustment of the mA and/or kV according to patient size and/or use of iterative reconstruction technique. CONTRAST:  65mL OMNIPAQUE IOHEXOL 300 MG/ML  SOLN COMPARISON:  02/23/2018 FINDINGS: Lower chest: Since the prior exam, and anterior diaphragmatic hernia has enlarged, now containing numerous loops of small bowel as well as transverse colon. No acute airspace disease, effusion, or pneumothorax. Hepatobiliary: No focal liver abnormality is seen. No gallstones, gallbladder wall thickening, or biliary dilatation. Pancreas: Unremarkable. No pancreatic ductal dilatation or surrounding inflammatory changes. Spleen: Normal in size without focal abnormality. Adrenals/Urinary Tract: Adrenal glands are unremarkable. Kidneys are normal, without renal calculi, focal lesion, or hydronephrosis. Bladder is unremarkable. Stomach/Bowel: As discussed above, there is a large anterior diaphragmatic hernia, extending anterior to the heart and occupying a significant portion of the right anterior hemithorax. This diaphragmatic hernia contains large portions of the proximal small bowel extending from the duodenum through at least the mid jejunum. Portions of the transverse colon are also contained within the hernia sac. There are multiple dilated loops of small bowel within the hernia sac, measuring up to 3.9 cm in diameter, with numerous gas fluid levels, consistent  with obstruction. Within the right hemiabdomen, separate from the hernia sac, there are additional dilated loops of distal jejunum or ileum measuring up to 2.9 cm in diameter, representing a second segment of small bowel obstruction. The stomach, normally located below the left hemidiaphragm, is also distended.  Decompression with enteric catheter may be useful. I do not see any bowel wall thickening or inflammatory change. The appendix cannot be identified. Vascular/Lymphatic: No significant vascular findings are present. No enlarged abdominal or pelvic lymph nodes. Reproductive: Uterus and bilateral adnexa are unremarkable. Other: As discussed above, there is a large anterior diaphragmatic hernia. The defect within the anterior aspect of the diaphragm measures approximately 5.2 cm in AP dimension and at least 6.5 cm in transverse dimension. There is no free fluid or free intraperitoneal gas. Musculoskeletal: No acute or destructive bony lesions. Reconstructed images demonstrate no additional findings. IMPRESSION: 1. Large anterior diaphragmatic hernia, markedly increased since prior study. Significant portions of large and small bowel extend into the diaphragmatic hernia, with segmental small-bowel obstruction within the hernia sac as well as within the right mid abdomen as above. No evidence of bowel ischemia. 2. Distension of the stomach, which is normally located in the left upper quadrant, likely as result of herniation of the duodenum described above. Decompression with enteric catheter may be useful. Electronically Signed   By: Sharlet Salina M.D.   On: 03/12/2022 16:26    Anti-infectives: Anti-infectives (From admission, onward)    None        Assessment/Plan 51 yo female with a recurrent diaphragmatic hernia and associated obstruction.  - Ok for ice chips and sips, do not advance diet given obstruction - Will discuss repair with [facility] this week, vs outpatient follow up if patient has return of bowel function and exam remains benign - Surgery will continue to follow    LOS: 0 days    Sophronia Simas, MD [facility] General, Hepatobiliary and Pancreatic Surgery 03/06/22 2:40 PM

## 2022-03-06 NOTE — ED Notes (Signed)
Patient assisted to restroom.  

## 2022-03-06 NOTE — Plan of Care (Signed)
  Problem: Coping: Goal: Level of anxiety will decrease Outcome: Progressing   Problem: Pain Managment: Goal: General experience of comfort will improve Outcome: Progressing   

## 2022-03-06 NOTE — Progress Notes (Signed)
  Date: 03/06/2022  Patient name: Barbara Ward  Medical record number: [id number]  Date of birth: [birth year]   I have seen and evaluated Alvera Singh and discussed their care with the Residency Team.  In brief, patient is a 50 year old female with past medical history of Prader-Willi syndrome with developmental delay, hyperlipidemia, sleep apnea, hypothyroidism, anterior diaphragmatic hernia status post laparoscopic repair in 2019 who presented to the ED with abdominal pain x1 day.  Pain is similar to her previous diaphragmatic hernia pain.  Patient did have a bowel movement on the night prior to her admission.  She does complain of associated nausea and decreased oral intake.  Patient was sent to the hospital from her group home for further evaluation.  In the ED, patient had imaging which showed large diaphragmatic hernia with evidence of small bowel obstruction and she was admitted for further work-up.  Patient does state that her pain is better this morning and she does have flatulence but has not had a bowel movement yet today.  PMHx, Fam Hx, and/or Soc Hx : As per resident admit note  Vitals:   03/06/22 0806 03/06/22 1202  BP: 131/62 130/60  Pulse: 84 83  Resp: 16 15  Temp:  98.1 F (36.7 C)  SpO2: 99% 99%   General: Awake, alert, oriented x3, NAD CVS: Regular rate and rhythm, normal heart sounds, Lungs: CTA bilaterally Abdomen: Soft, nontender, nondistended, no active bowel sounds Extremities: No edema noted, nontender to palpation Psych: Normal mood and affect HEENT: Normocephalic, traumatic Skin: Warm and dry  Assessment and Plan: I have seen and evaluated the patient as outlined above. I agree with the formulated Assessment and Plan as detailed in the residents' note, with the following changes:   1.  Small bowel obstruction secondary to diaphragmatic hernia: -Patient presented to ED with worsening abdominal pain x1 day associated with nausea and decreased oral intake and  was found to have a large anterior diaphragmatic hernia with small bowel obstruction.  No evidence of ischemia noted on CT. -Surgery follow-up and recommendations appreciated. -Patient was initially recommended to have an NG tube placed but first attempt was unsuccessful and patient refused further attempts.  Since patient's pain is better and she is having flatulence I do not think we need to replace NG tube at this time.  Will discuss with surgery. -We will continue with IV fluids for now -Maintain n.p.o. -No indication for emergent surgery unless patient develops signs of strangulation -We will hold antihypertensives and other oral medications while patient is n.p.o. Of note, patient is on chronic Xanax at home for anxiety.  We do not want stop this medication suddenly.  We will put the patient on IV Ativan twice daily to prevent benzo withdrawal. -Patient will likely need diaphragmatic hernia repair once her bowel obstruction improves (hopefully later this week). -Case was discussed with surgery by resident today.  We will consider transfer to surgery service in a.m. given there are no active medical issues to manage at this time.  Earl Lagos, MD 7/16/20231:01 PM

## 2022-03-06 NOTE — ED Notes (Signed)
Pt ambulated to bathroom with minimal assist. Pt used Rolator walker to assist herself to the bathroom. Pt back in bed and resting comfortably at this time.

## 2022-03-06 NOTE — Progress Notes (Signed)
Subjective:   Hospital day 0.  Interval events: refused NG tube placement.  Patient feels much better this morning.  She is passing flatus.  She can hear and feel her stomach rumbling.  She has a good appetite this morning.  Her abdominal pain has resolved.  She denies nausea and vomiting.  She wishes she could eat.  Objective:  Vital signs: Blood pressure 130/60, pulse 83, temperature 98.1 F (36.7 C), temperature source Oral, resp. rate 15, height 4\' 8"  (1.422 m), weight 79.4 kg, SpO2 99 %.  Physical exam: Constitutional: Comfortable appearing female. Cardiovascular: Regular rate and rhythm. Pulmonary: Clear to auscultation.  Normal work of breathing. Abdominal: Soft and nontender on light and deep palpation.  Bowel sounds present in left chest. Skin: Warm and dry. Extremities: Right radial pulse 2+. Neuro: Alert and oriented. Psych: Pleasant mood and affect.   Intake/Output Summary (Last 24 hours) at 03/06/2022 1252 Last data filed at 02/24/2022 1747 Gross per 24 hour  Intake 4.82 ml  Output --  Net 4.82 ml    Pertinent Labs:    Latest Ref Rng & Units 03/06/2022    4:50 AM 02/26/2022   11:08 AM 03/01/2018    4:13 AM  CBC  WBC 4.0 - 10.5 K/uL 7.2  8.5  11.0   Hemoglobin 12.0 - 15.0 g/dL 05/02/2018  65.7  84.6   Hematocrit 36.0 - 46.0 % 42.8  46.6  34.9   Platelets 150 - 400 K/uL 211  274  188        Latest Ref Rng & Units 03/06/2022    5:52 AM 03/11/2022   11:08 AM 03/04/2018    4:27 AM  CMP  Glucose 70 - 99 mg/dL 03/06/2018  952  841   BUN 6 - 20 mg/dL 10  14  11    Creatinine 0.44 - 1.00 mg/dL 324   4.01   Sodium 135 - 145 mmol/L 139  142  136   Potassium 3.5 - 5.1 mmol/L 3.5  3.5  4.8   Chloride 98 - 111 mmol/L 103  102  107   CO2 22 - 32 mmol/L 26  25  23    Calcium 8.9 - 10.3 mg/dL 8.3  9.5  8.0   Total Protein 6.5 - 8.1 g/dL  7.0    Total Bilirubin 0.3 - 1.2 mg/dL  1.1    Alkaline Phos 38 - 126 U/L  78    AST 15 - 41 U/L  22    ALT 0 - 44  U/L  16     TSH: 0.474 Magnesium: 2.1 Phosphorus: 2.7  Assessment/Plan:   Principal Problem:   Small bowel obstruction (HCC)   Patient Summary: Barbara Ward is a 51 y.o. with a PMH of developmental delay, diaphragmatic hernia, who presented with abdominal pain and nausea and was admitted for small bowel obstruction without ischemia secondary to diaphragmatic hernia.   SBO Diaphragmatic hernia Patient refused NG tube decompression overnight.  However, she is feeling much improved today without nausea or abdominal pain.  Her abdominal exam is nontender and she has active bowel sounds with passing of flatus.  Given subjective report and exam findings I suspect that the patient's SBO may have resolved spontaneously.  However this patient still at high risk for obstruction and incarceration of bowel.  Surgery continues to follow. - Maintain n.p.o. and follow-up recommendations from surgery team. - Continue  NS infusion at rate of 125 mL/h - Continue Dilaudid 0.5 mg IV every 6 hours as needed for severe pain - Continue acetaminophen 650 mg p.o./rectal every 6 hours as needed for mild pain or fever - Continue Zofran 4 mg p.o./IV every 6 hours as needed for nausea  Anxiety Chronic prescription benzodiazepine use Patient does not appear anxious today.  Due to concern for benzodiazepine withdrawal we will start low-dose of as needed IV benzodiazepine while patient's home oral medications are being withheld. - Start lorazepam 0.5 mg IV every 6 hours as needed for anxiety, seizure, and benzodiazepine withdrawal syndrome.  Hypertension - Hold home ramipril.   Hyperlipidemia - Hold home atorvastatin.  Hypothyroidism - Hold home Synthroid.  Vitamin D deficiency - Hold home cholecalciferol.  Lymphedema - Hold home Lasix.  Diet: NPO IVF: NS,125cc/hr VTE: Enoxaparin Code: Full PT/OT recs: Pending. Family Update: mother at bedside  Dispo: Anticipated discharge to Home in 2 days  pending definitive surgical plan.   Marrianne Mood, MD 03/06/2022, 12:52 PM  Pager: [phone] After 5pm on weekdays and 1pm on weekends: On Call pager: [phone]

## 2022-03-07 DIAGNOSIS — K440 Diaphragmatic hernia with obstruction, without gangrene: Secondary | ICD-10-CM | POA: Diagnosis not present

## 2022-03-07 LAB — CBC
HCT: 43 % (ref 36.0–46.0)
Hemoglobin: 13.6 g/dL (ref 12.0–15.0)
MCH: 31 pg (ref 26.0–34.0)
MCHC: 31.6 g/dL (ref 30.0–36.0)
MCV: 97.9 fL (ref 80.0–100.0)
Platelets: 223 10*3/uL (ref 150–400)
RBC: 4.39 MIL/uL (ref 3.87–5.11)
RDW: 14 % (ref 11.5–15.5)
WBC: 6.3 10*3/uL (ref 4.0–10.5)
nRBC: 0 % (ref 0.0–0.2)

## 2022-03-07 LAB — BASIC METABOLIC PANEL
Anion gap: 11 (ref 5–15)
BUN: 11 mg/dL (ref 6–20)
CO2: 24 mmol/L (ref 22–32)
Calcium: 8.2 mg/dL — ABNORMAL LOW (ref 8.9–10.3)
Chloride: 109 mmol/L (ref 98–111)
Creatinine, Ser: 0.82 mg/dL (ref 0.44–1.00)
GFR, Estimated: >60 mL/min (ref >60)
Glucose, Bld: 76 mg/dL (ref 70–99)
Potassium: 3.5 mmol/L (ref 3.5–5.1)
Sodium: 144 mmol/L (ref 135–145)

## 2022-03-07 MED ORDER — CELECOXIB 200 MG PO CAPS
200.0000 mg | ORAL_CAPSULE | ORAL | Status: AC
  Administered 2022-03-08: 200 mg via ORAL
  Filled 2022-03-07 (×2): qty 1

## 2022-03-07 MED ORDER — VANCOMYCIN HCL 1500 MG/300ML IV SOLN
1500.0000 mg | INTRAVENOUS | Status: AC
Start: 2022-03-08 — End: 2022-03-08
  Administered 2022-03-08: 1500 mg via INTRAVENOUS
  Filled 2022-03-07: qty 300

## 2022-03-07 MED ORDER — GENTAMICIN SULFATE 40 MG/ML IJ SOLN
300.0000 mg | INTRAVENOUS | Status: AC
  Administered 2022-03-08: 300 mg via INTRAVENOUS
  Filled 2022-03-07: qty 7.5

## 2022-03-07 MED ORDER — BUPIVACAINE LIPOSOME 1.3 % IJ SUSP
20.0000 mL | Freq: Once | INTRAMUSCULAR | Status: DC
  Filled 2022-03-07: qty 20

## 2022-03-07 MED ORDER — CHLORHEXIDINE GLUCONATE CLOTH 2 % EX PADS
6.0000 | MEDICATED_PAD | Freq: Once | CUTANEOUS | Status: AC
  Administered 2022-03-08: 6 via TOPICAL

## 2022-03-07 MED ORDER — ENSURE PRE-SURGERY PO LIQD
296.0000 mL | Freq: Once | ORAL | Status: AC
  Administered 2022-03-08: 296 mL via ORAL
  Filled 2022-03-07: qty 296

## 2022-03-07 MED ORDER — VANCOMYCIN HCL 1.5 G IV SOLR
1500.0000 mg | Freq: Once | INTRAVENOUS | Status: DC
  Filled 2022-03-07: qty 30

## 2022-03-07 MED ORDER — ENSURE PRE-SURGERY PO LIQD
592.0000 mL | Freq: Once | ORAL | Status: DC
  Filled 2022-03-07 (×2): qty 592

## 2022-03-07 MED ORDER — ACETAMINOPHEN 500 MG PO TABS
1000.0000 mg | ORAL_TABLET | ORAL | Status: AC
  Administered 2022-03-08: 1000 mg via ORAL
  Filled 2022-03-07: qty 2

## 2022-03-07 MED ORDER — GABAPENTIN 300 MG PO CAPS
300.0000 mg | ORAL_CAPSULE | ORAL | Status: AC
  Administered 2022-03-08: 300 mg via ORAL
  Filled 2022-03-07: qty 1

## 2022-03-07 MED ORDER — ENOXAPARIN SODIUM 40 MG/0.4ML IJ SOSY
40.0000 mg | PREFILLED_SYRINGE | INTRAMUSCULAR | Status: DC
  Administered 2022-03-07 – 2022-03-23 (×17): 40 mg via SUBCUTANEOUS
  Filled 2022-03-07 (×18): qty 0.4

## 2022-03-07 NOTE — Progress Notes (Signed)
Subjective: No acute complaints. No nausea or abdominal pain.  Passing flatus.  Wants to drink some liquids.   Objective: Vital signs in last 24 hours: Temp:  [97.5 F (36.4 C)-99.9 F (37.7 C)] 98 F (36.7 C) (07/17 0940) Pulse Rate:  [83-108] 99 (07/17 0940) Resp:  [15-19] 19 (07/17 0940) BP: (105-130)/(58-97) 105/76 (07/17 0940) SpO2:  [91 %-100 %] 91 % (07/17 0940) Last BM Date : 03/16/2022  Intake/Output from previous day: 07/16 0701 - 07/17 0700 In: 3944.8 [P.O.:240; I.V.:3704.8] Out: 500 [Urine:500] Intake/Output this shift: No intake/output data recorded.  PE: General: resting comfortably, NAD Abdomen: soft, nondistended, nontender to palpation.     Lab Results:  Recent Labs    03/06/22 0450 03/07/22 0244  WBC 7.2 6.3  HGB 13.4 13.6  HCT 42.8 43.0  PLT 211 223   BMET Recent Labs    03/06/22 0552 03/07/22 0244  NA 139 144  K 3.5 3.5  CL 103 109  CO2 26 24  GLUCOSE 100* 76  BUN 10 11  CREATININE 0.91 0.82  CALCIUM 8.3* 8.2*   PT/INR No results for input(s): "LABPROT", "INR" in the last 72 hours. CMP     Component Value Date/Time   NA 144 03/07/2022 0244   K 3.5 03/07/2022 0244   CL 109 03/07/2022 0244   CO2 24 03/07/2022 0244   GLUCOSE 76 03/07/2022 0244   BUN 11 03/07/2022 0244   CREATININE 0.82 03/07/2022 0244   CALCIUM 8.2 (L) 03/07/2022 0244   PROT 7.0 03/09/2022 1108   ALBUMIN 3.7 03/18/2022 1108   AST 22 03/18/2022 1108   ALT 16 03/16/2022 1108   ALKPHOS 78 02/26/2022 1108   BILITOT 1.1 02/27/2022 1108   GFRNONAA >60 03/07/2022 0244   GFRAA >60 03/04/2018 0427   Lipase     Component Value Date/Time   LIPASE 24 02/19/2022 1108       Studies/Results: CT ABDOMEN PELVIS W CONTRAST  Result Date: 02/28/2022 CLINICAL DATA:  Generalized abdominal pain since yesterday EXAM: CT ABDOMEN AND PELVIS WITH CONTRAST TECHNIQUE: Multidetector CT imaging of the abdomen and pelvis was performed using the standard protocol  following bolus administration of intravenous contrast. RADIATION DOSE REDUCTION: This exam was performed according to the departmental dose-optimization program which includes automated exposure control, adjustment of the mA and/or kV according to patient size and/or use of iterative reconstruction technique. CONTRAST:  50mL OMNIPAQUE IOHEXOL 300 MG/ML  SOLN COMPARISON:  02/23/2018 FINDINGS: Lower chest: Since the prior exam, and anterior diaphragmatic hernia has enlarged, now containing numerous loops of small bowel as well as transverse colon. No acute airspace disease, effusion, or pneumothorax. Hepatobiliary: No focal liver abnormality is seen. No gallstones, gallbladder wall thickening, or biliary dilatation. Pancreas: Unremarkable. No pancreatic ductal dilatation or surrounding inflammatory changes. Spleen: Normal in size without focal abnormality. Adrenals/Urinary Tract: Adrenal glands are unremarkable. Kidneys are normal, without renal calculi, focal lesion, or hydronephrosis. Bladder is unremarkable. Stomach/Bowel: As discussed above, there is a large anterior diaphragmatic hernia, extending anterior to the heart and occupying a significant portion of the right anterior hemithorax. This diaphragmatic hernia contains large portions of the proximal small bowel extending from the duodenum through at least the mid jejunum. Portions of the transverse colon are also contained within the hernia sac. There are multiple dilated loops of small bowel within the hernia sac, measuring up to 3.9 cm in diameter, with numerous gas fluid levels, consistent with obstruction. Within the right hemiabdomen, separate from  the hernia sac, there are additional dilated loops of distal jejunum or ileum measuring up to 2.9 cm in diameter, representing a second segment of small bowel obstruction. The stomach, normally located below the left hemidiaphragm, is also distended. Decompression with enteric catheter may be useful. I do not  see any bowel wall thickening or inflammatory change. The appendix cannot be identified. Vascular/Lymphatic: No significant vascular findings are present. No enlarged abdominal or pelvic lymph nodes. Reproductive: Uterus and bilateral adnexa are unremarkable. Other: As discussed above, there is a large anterior diaphragmatic hernia. The defect within the anterior aspect of the diaphragm measures approximately 5.2 cm in AP dimension and at least 6.5 cm in transverse dimension. There is no free fluid or free intraperitoneal gas. Musculoskeletal: No acute or destructive bony lesions. Reconstructed images demonstrate no additional findings. IMPRESSION: 1. Large anterior diaphragmatic hernia, markedly increased since prior study. Significant portions of large and small bowel extend into the diaphragmatic hernia, with segmental small-bowel obstruction within the hernia sac as well as within the right mid abdomen as above. No evidence of bowel ischemia. 2. Distension of the stomach, which is normally located in the left upper quadrant, likely as result of herniation of the duodenum described above. Decompression with enteric catheter may be useful. Electronically Signed   By: Sharlet Salina M.D.   On: 03/09/2022 16:26    Anti-infectives: Anti-infectives (From admission, onward)    None        Assessment/Plan Recurrent diaphragmatic hernia and associated obstruction.  - appears to have obstruction improving as she has no further N/V and is passing flatus. -will allow clear liquids today, NPO p MN.  If she has N/V with clears, then back down to NPO -await to hear from regarding possible OR tomorrow for repair. -discussed with patient and her mother at bedside today  FEN: CLD, IVFs VTE: Lovenox ID: none currently warranted  Prader-Willi syndrome MR HLD OSA   LOS: 1 day    Barbara Ward Please consult amion for pager number from 7-4:30pm 03/07/22 10:13 AM

## 2022-03-07 NOTE — H&P (View-Only) (Signed)
Recurrent anterior diaphragmatic hernia.  Patient sitting up smiling and trying clear liquids.  Wants broth.  Family at bedside.  Patient denies any pain.  Abdomen obese but soft.  We will redo with invasively.  Suture with mesh reinforcement this time.  Most likely will need drain in mediastinum for a while.    The anatomy & physiology of the abdominal wall was discussed.  The pathophysiology of hernias was discussed.  Natural history risks without surgery including progeressive enlargement, pain, incarceration, & strangulation was discussed.   Contributors to complications such as smoking, obesity, diabetes, prior surgery, etc were discussed.   I feel the risks of no intervention will lead to serious problems that outweigh the operative risks; therefore, I recommended surgery to reduce and repair the hernia.  I explained laparoscopic techniques with possible need for an open approach.  Mesh reinforcement.  I noted the probable use of mesh to patch and/or buttress the hernia repair  Risks such as bleeding, infection, abscess, need for further treatment, injury to other organs, need for repair of tissues / organs, stroke, heart attack, death, and other risks were discussed.  I noted a good likelihood this will help address the problem.   Goals of post-operative recovery were discussed as well.  Possibility that this will not correct all symptoms was explained.  I stressed the importance of low-impact activity, aggressive pain control, avoiding constipation, & not pushing through pain to minimize risk of post-operative chronic pain or injury. Possibility of reherniation especially with smoking, obesity, diabetes, immunosuppression, and other health conditions was discussed.  We will work to minimize complications.    Questions were answered.  The patient & parent express understanding & wishes to proceed with surgery.  Ardeth Sportsman, MD, FACS, MASCRS Esophageal, Gastrointestinal & Colorectal  Surgery Robotic and Minimally Invasive Surgery  [facility] [address], [address] [place], [place] [address] [phone] Fax [phone] Main  CONTACT INFORMATION:  Weekday (9AM-5PM): Call [facility] main office at [phone]  Weeknight (5PM-9AM) or Weekend/Holiday: Check [email or web address] (password " [email or web address]") for General Surgery [facility] coverage  (Please, do not use SecureChat as it is not reliable communication to reach operating surgeons for immediate patient care)

## 2022-03-07 NOTE — Progress Notes (Incomplete)
The anatomy & physiology of the abdominal wall was discussed.  The pathophysiology of hernias was discussed.  Natural history risks without surgery including progeressive enlargement, pain, incarceration, & strangulation was discussed.   Contributors to complications such as smoking, obesity, diabetes, prior surgery, etc were discussed.   I feel the risks of no intervention will lead to serious problems that outweigh the operative risks; therefore, I recommended surgery to reduce and repair the hernia.  I explained laparoscopic techniques with possible need for an open approach.  Mesh reinforcement.  I noted the probable use of mesh to patch and/or buttress the hernia repair  Risks such as bleeding, infection, abscess, need for further treatment, injury to other organs, need for repair of tissues / organs, stroke, heart attack, death, and other risks were discussed.  I noted a good likelihood this will help address the problem.   Goals of post-operative recovery were discussed as well.  Possibility that this will not correct all symptoms was explained.  I stressed the importance of low-impact activity, aggressive pain control, avoiding constipation, & not pushing through pain to minimize risk of post-operative chronic pain or injury. Possibility of reherniation especially with smoking, obesity, diabetes, immunosuppression, and other health conditions was discussed.  We will work to minimize complications.     An educational handout further explaining the pathology & treatment options was given as well.  Questions were answered.  The patient & parents express understanding & wishes to proceed with surgery.  Ardeth Sportsman, MD, FACS, MASCRS Esophageal, Gastrointestinal & Colorectal Surgery Robotic and Minimally Invasive Surgery  [facility] [address], [address] [place], [place] [address] [phone] Fax [phone] Main  CONTACT  INFORMATION:  Weekday (9AM-5PM): Call [facility] main office at [phone]  Weeknight (5PM-9AM) or Weekend/Holiday: Check [email or web address] (password " [email or web address]") for General Surgery [facility] coverage  (Please, do not use SecureChat as it is not reliable communication to reach operating surgeons for immediate patient care)

## 2022-03-07 NOTE — Progress Notes (Signed)
Subjective:   Hospital day 1.  Interval events: None.  Feels good today.  No abdominal pain.  No nausea.  No vomiting.  Passing gas.  Endorses bowel movement this morning.  Has a good appetite today and would like to eat.  Objective:  Vital signs: Blood pressure 116/72, pulse (!) 108, temperature 99.9 F (37.7 C), temperature source Oral, resp. rate 18, height 4\' 8"  (1.422 m), weight 79.4 kg, SpO2 94 %.  Physical exam: Constitutional: Female sitting upright in bed.  In no apparent distress. Cardiovascular: Regular rate and rhythm. Pulmonary: Normal work of breathing noted. Abdominal: Nontender.  Nondistended. Skin: Warm and dry. Extremities: Right radial pulse 2+. Neuro: Alert and oriented. Psych: Pleasant mood and affect.   Intake/Output Summary (Last 24 hours) at 03/07/2022 0644 Last data filed at 03/07/2022 0542 Gross per 24 hour  Intake 3444.79 ml  Output 500 ml  Net 2944.79 ml    Pertinent Labs:    Latest Ref Rng & Units 03/07/2022    2:44 AM 03/06/2022    4:50 AM 03/17/2022   11:08 AM  CBC  WBC 4.0 - 10.5 K/uL 6.3  7.2  8.5   Hemoglobin 12.0 - 15.0 g/dL 03/07/2022  59.9  35.7   Hematocrit 36.0 - 46.0 % 43.0  42.8  46.6   Platelets 150 - 400 K/uL 223  211  274        Latest Ref Rng & Units 03/07/2022    2:44 AM 03/06/2022    5:52 AM 03/04/2022   11:08 AM  CMP  Glucose 70 - 99 mg/dL 76  03/07/2022  793   BUN 6 - 20 mg/dL 11  10  14    Creatinine 0.44 - 1.00 mg/dL 903   0.09   Sodium 135 - 145 mmol/L 144  139  142   Potassium 3.5 - 5.1 mmol/L 3.5  3.5  3.5   Chloride 98 - 111 mmol/L 109  103  102   CO2 22 - 32 mmol/L 24  26  25    Calcium 8.9 - 10.3 mg/dL 8.2  8.3  9.5   Total Protein 6.5 - 8.1 g/dL   7.0   Total Bilirubin 0.3 - 1.2 mg/dL   1.1   Alkaline Phos 38 - 126 U/L   78   AST 15 - 41 U/L   22   ALT 0 - 44 U/L   16    Assessment/Plan:   Principal Problem:   Small bowel obstruction (HCC)   Patient Summary: Barbara Ward is a 51  y.o. with a PMH of Prader-Willi syndrome, diaphragmatic hernia, who presented with nausea and abdominal pain and was admitted for small bowel obstruction.  SBO Diaphragmatic hernia Still feeling well this morning.  Had a large bowel movement this morning.  Passing gas regularly.  Drinking water and appears well-hydrated, thus will DC fluids.  Signs point to spontaneous resolution of obstruction, however the patient remains at high risk for recurrent SBO, strangulation, and ischemia.  Plan for surgical repair of diaphragmatic hernia tomorrow 03/18/2022. - N.p.o. at midnight. - Prophylactic vancomycin and gentamicin per surgery. - Discontinued IV fluids. - Continue Dilaudid 0.5 mg IV every 6 hours as needed for severe pain - Continue acetaminophen 650 mg p.o./rectal every 6 hours as needed for mild pain or fever - Continue Zofran 4 mg p.o./IV every 6 hours as needed for nausea  Anxiety  Chronic prescription benzodiazepine use 1 dose administered yesterday for anxiety. - Continue lorazepam 0.5 mg IV every 6 hours as needed while n.p.o. for anxiety, seizure, and benzodiazepine withdrawal syndrome.  Chronic stable medical conditions Restart home medicines after surgery.  Refer to yesterday's progress note.  Diet:  Clear liquid IVF: None,None VTE: Enoxaparin Code: Full Family Update: Family at bedside   Dispo: Anticipated discharge to Home in 2 days pending surgery to repair hernia.   Marrianne Mood, MD 03/07/2022, 6:44 AM  Pager: [phone] After 5pm on weekdays and 1pm on weekends: On Call pager: [phone]

## 2022-03-08 ENCOUNTER — Encounter (HOSPITAL_COMMUNITY): Payer: Self-pay | Admitting: Internal Medicine

## 2022-03-08 ENCOUNTER — Inpatient Hospital Stay (HOSPITAL_COMMUNITY): Payer: Medicare Other | Admitting: Anesthesiology

## 2022-03-08 ENCOUNTER — Inpatient Hospital Stay (HOSPITAL_COMMUNITY): Payer: Medicare Other

## 2022-03-08 ENCOUNTER — Encounter (HOSPITAL_COMMUNITY)
Admission: EM | Disposition: E | Payer: Self-pay | Source: Home / Self Care | Attending: Student in an Organized Health Care Education/Training Program

## 2022-03-08 ENCOUNTER — Other Ambulatory Visit: Payer: Self-pay

## 2022-03-08 DIAGNOSIS — I959 Hypotension, unspecified: Secondary | ICD-10-CM | POA: Diagnosis not present

## 2022-03-08 DIAGNOSIS — K440 Diaphragmatic hernia with obstruction, without gangrene: Secondary | ICD-10-CM

## 2022-03-08 DIAGNOSIS — R0689 Other abnormalities of breathing: Secondary | ICD-10-CM | POA: Insufficient documentation

## 2022-03-08 DIAGNOSIS — E039 Hypothyroidism, unspecified: Secondary | ICD-10-CM

## 2022-03-08 DIAGNOSIS — G4733 Obstructive sleep apnea (adult) (pediatric): Secondary | ICD-10-CM

## 2022-03-08 DIAGNOSIS — Z9989 Dependence on other enabling machines and devices: Secondary | ICD-10-CM

## 2022-03-08 DIAGNOSIS — K449 Diaphragmatic hernia without obstruction or gangrene: Secondary | ICD-10-CM

## 2022-03-08 HISTORY — PX: XI ROBOTIC ASSISTED HIATAL HERNIA REPAIR: SHX6889

## 2022-03-08 LAB — BASIC METABOLIC PANEL
Anion gap: 6 (ref 5–15)
BUN: 5 mg/dL — ABNORMAL LOW (ref 6–20)
CO2: 26 mmol/L (ref 22–32)
Calcium: 8.7 mg/dL — ABNORMAL LOW (ref 8.9–10.3)
Chloride: 106 mmol/L (ref 98–111)
Creatinine, Ser: 0.73 mg/dL (ref 0.44–1.00)
GFR, Estimated: >60 mL/min (ref >60)
Glucose, Bld: 124 mg/dL — ABNORMAL HIGH (ref 70–99)
Potassium: 3.1 mmol/L — ABNORMAL LOW (ref 3.5–5.1)
Sodium: 138 mmol/L (ref 135–145)

## 2022-03-08 LAB — POCT I-STAT 7, (LYTES, BLD GAS, ICA,H+H)
Acid-base deficit: 6 mmol/L — ABNORMAL HIGH (ref 0.0–2.0)
Acid-base deficit: 9 mmol/L — ABNORMAL HIGH (ref 0.0–2.0)
Bicarbonate: 21.9 mmol/L (ref 20.0–28.0)
Bicarbonate: 21.4 mmol/L (ref 20.0–28.0)
Calcium, Ion: 1.18 mmol/L (ref 1.15–1.40)
Calcium, Ion: 1.24 mmol/L (ref 1.15–1.40)
HCT: 39 % (ref 36.0–46.0)
HCT: 43 % (ref 36.0–46.0)
Hemoglobin: 13.3 g/dL (ref 12.0–15.0)
Hemoglobin: 14.6 g/dL (ref 12.0–15.0)
O2 Saturation: 100 %
O2 Saturation: 99 %
Patient temperature: 36.2
Patient temperature: 97.6
Potassium: 4.4 mmol/L (ref 3.5–5.1)
Potassium: 3.6 mmol/L (ref 3.5–5.1)
Sodium: 133 mmol/L — ABNORMAL LOW (ref 135–145)
Sodium: 137 mmol/L (ref 135–145)
TCO2: 23 mmol/L (ref 22–32)
TCO2: 23 mmol/L (ref 22–32)
pCO2 arterial: 50.1 mmHg — ABNORMAL HIGH (ref 32–48)
pCO2 arterial: 61.5 mmHg — ABNORMAL HIGH (ref 32–48)
pH, Arterial: 7.244 — ABNORMAL LOW (ref 7.35–7.45)
pH, Arterial: 7.146 — CL (ref 7.35–7.45)
pO2, Arterial: 323 mmHg — ABNORMAL HIGH (ref 83–108)
pO2, Arterial: 152 mmHg — ABNORMAL HIGH (ref 83–108)

## 2022-03-08 LAB — CBC
HCT: 39.9 % (ref 36.0–46.0)
Hemoglobin: 13.4 g/dL (ref 12.0–15.0)
MCH: 31.7 pg (ref 26.0–34.0)
MCHC: 33.6 g/dL (ref 30.0–36.0)
MCV: 94.3 fL (ref 80.0–100.0)
Platelets: 228 10*3/uL (ref 150–400)
RBC: 4.23 MIL/uL (ref 3.87–5.11)
RDW: 13.7 % (ref 11.5–15.5)
WBC: 7.5 10*3/uL (ref 4.0–10.5)
nRBC: 0 % (ref 0.0–0.2)

## 2022-03-08 LAB — ABO/RH: ABO/RH(D): O POS

## 2022-03-08 LAB — GLUCOSE, CAPILLARY
Glucose-Capillary: 209 mg/dL — ABNORMAL HIGH (ref 70–99)
Glucose-Capillary: 213 mg/dL — ABNORMAL HIGH (ref 70–99)

## 2022-03-08 LAB — TYPE AND SCREEN
ABO/RH(D): O POS
Antibody Screen: NEGATIVE

## 2022-03-08 SURGERY — REPAIR, HERNIA, HIATAL, ROBOT-ASSISTED
Anesthesia: General | Site: Abdomen

## 2022-03-08 MED ORDER — ESMOLOL HCL 100 MG/10ML IV SOLN
INTRAVENOUS | Status: AC
  Filled 2022-03-08: qty 10

## 2022-03-08 MED ORDER — PANTOPRAZOLE SODIUM 40 MG IV SOLR
40.0000 mg | INTRAVENOUS | Status: DC
  Administered 2022-03-09 (×2): 40 mg via INTRAVENOUS
  Filled 2022-03-08 (×2): qty 10

## 2022-03-08 MED ORDER — DEXAMETHASONE SODIUM PHOSPHATE 10 MG/ML IJ SOLN
INTRAMUSCULAR | Status: AC
  Filled 2022-03-08: qty 1

## 2022-03-08 MED ORDER — POTASSIUM CHLORIDE 10 MEQ/100ML IV SOLN
10.0000 meq | INTRAVENOUS | Status: DC
  Administered 2022-03-08: 10 meq via INTRAVENOUS
  Filled 2022-03-08 (×4): qty 100

## 2022-03-08 MED ORDER — PHENYLEPHRINE HCL-NACL 20-0.9 MG/250ML-% IV SOLN
INTRAVENOUS | Status: AC
  Filled 2022-03-08: qty 250

## 2022-03-08 MED ORDER — PHENYLEPHRINE HCL (PRESSORS) 10 MG/ML IV SOLN
INTRAVENOUS | Status: AC
  Filled 2022-03-08: qty 1

## 2022-03-08 MED ORDER — POLYETHYLENE GLYCOL 3350 17 G PO PACK
17.0000 g | PACK | Freq: Every day | ORAL | Status: DC
Start: 2022-03-09 — End: 2022-03-09

## 2022-03-08 MED ORDER — FENTANYL CITRATE (PF) 250 MCG/5ML IJ SOLN
INTRAMUSCULAR | Status: AC
  Filled 2022-03-08: qty 5

## 2022-03-08 MED ORDER — BUPIVACAINE-EPINEPHRINE (PF) 0.25% -1:200000 IJ SOLN
INTRAMUSCULAR | Status: AC
  Filled 2022-03-08: qty 60

## 2022-03-08 MED ORDER — ONDANSETRON HCL 4 MG/2ML IJ SOLN
INTRAMUSCULAR | Status: AC
Start: 2022-03-08 — End: ?
  Filled 2022-03-08: qty 2

## 2022-03-08 MED ORDER — ALBUTEROL SULFATE HFA 108 (90 BASE) MCG/ACT IN AERS
INHALATION_SPRAY | RESPIRATORY_TRACT | Status: DC | PRN
  Administered 2022-03-08: 4 via RESPIRATORY_TRACT

## 2022-03-08 MED ORDER — PHENYLEPHRINE HCL-NACL 20-0.9 MG/250ML-% IV SOLN
25.0000 ug/min | INTRAVENOUS | Status: DC
  Administered 2022-03-08: 100 ug/min via INTRAVENOUS
  Administered 2022-03-09: 20 ug/min via INTRAVENOUS
  Filled 2022-03-08 (×2): qty 250

## 2022-03-08 MED ORDER — MIDAZOLAM HCL 2 MG/2ML IJ SOLN
INTRAMUSCULAR | Status: AC
Start: 2022-03-08 — End: ?
  Filled 2022-03-08: qty 2

## 2022-03-08 MED ORDER — PROPOFOL 1000 MG/100ML IV EMUL
5.0000 ug/kg/min | INTRAVENOUS | Status: DC
  Administered 2022-03-08 – 2022-03-09 (×2): 20 ug/kg/min via INTRAVENOUS
  Filled 2022-03-08: qty 100

## 2022-03-08 MED ORDER — CHLORHEXIDINE GLUCONATE 0.12 % MT SOLN: 15.0000 mL | Freq: Once | OROMUCOSAL | Status: AC

## 2022-03-08 MED ORDER — PROPOFOL 10 MG/ML IV BOLUS
INTRAVENOUS | Status: AC
  Filled 2022-03-08: qty 20

## 2022-03-08 MED ORDER — CHLORHEXIDINE GLUCONATE CLOTH 2 % EX PADS
6.0000 | MEDICATED_PAD | Freq: Every day | CUTANEOUS | Status: DC
  Administered 2022-03-08 – 2022-03-24 (×15): 6 via TOPICAL

## 2022-03-08 MED ORDER — PHENYLEPHRINE 80 MCG/ML (10ML) SYRINGE FOR IV PUSH (FOR BLOOD PRESSURE SUPPORT)
PREFILLED_SYRINGE | INTRAVENOUS | Status: AC
  Filled 2022-03-08: qty 10

## 2022-03-08 MED ORDER — PROPOFOL 1000 MG/100ML IV EMUL
INTRAVENOUS | Status: AC
  Filled 2022-03-08: qty 100

## 2022-03-08 MED ORDER — PHENYLEPHRINE 80 MCG/ML (10ML) SYRINGE FOR IV PUSH (FOR BLOOD PRESSURE SUPPORT)
PREFILLED_SYRINGE | INTRAVENOUS | Status: DC | PRN
  Administered 2022-03-08: 160 ug via INTRAVENOUS
  Administered 2022-03-08 (×2): 80 ug via INTRAVENOUS
  Administered 2022-03-08 (×2): 160 ug via INTRAVENOUS
  Administered 2022-03-08 (×2): 200 ug via INTRAVENOUS
  Administered 2022-03-08 (×2): 160 ug via INTRAVENOUS
  Administered 2022-03-08: 80 ug via INTRAVENOUS

## 2022-03-08 MED ORDER — ROCURONIUM BROMIDE 10 MG/ML (PF) SYRINGE
PREFILLED_SYRINGE | INTRAVENOUS | Status: DC | PRN
  Administered 2022-03-08: 50 mg via INTRAVENOUS
  Administered 2022-03-08: 30 mg via INTRAVENOUS
  Administered 2022-03-08: 50 mg via INTRAVENOUS
  Administered 2022-03-08: 20 mg via INTRAVENOUS

## 2022-03-08 MED ORDER — DEXAMETHASONE SODIUM PHOSPHATE 10 MG/ML IJ SOLN
INTRAMUSCULAR | Status: DC | PRN
  Administered 2022-03-08: 5 mg via INTRAVENOUS

## 2022-03-08 MED ORDER — LIDOCAINE 2% (20 MG/ML) 5 ML SYRINGE
INTRAMUSCULAR | Status: DC | PRN
  Administered 2022-03-08: 100 mg via INTRAVENOUS

## 2022-03-08 MED ORDER — LACTATED RINGERS IV SOLN: INTRAVENOUS | Status: DC

## 2022-03-08 MED ORDER — SODIUM CHLORIDE 0.9 % IV SOLN
250.0000 mL | INTRAVENOUS | Status: DC
  Administered 2022-03-09: 250 mL via INTRAVENOUS

## 2022-03-08 MED ORDER — KETAMINE HCL 50 MG/5ML IJ SOSY
PREFILLED_SYRINGE | INTRAMUSCULAR | Status: AC
  Filled 2022-03-08: qty 5

## 2022-03-08 MED ORDER — LIDOCAINE 2% (20 MG/ML) 5 ML SYRINGE
INTRAMUSCULAR | Status: AC
  Filled 2022-03-08: qty 5

## 2022-03-08 MED ORDER — ESMOLOL HCL 100 MG/10ML IV SOLN
INTRAVENOUS | Status: DC | PRN
  Administered 2022-03-08: 20 mg via INTRAVENOUS

## 2022-03-08 MED ORDER — FENTANYL CITRATE (PF) 250 MCG/5ML IJ SOLN
INTRAMUSCULAR | Status: DC | PRN
Start: 2022-03-08 — End: 2022-03-08
  Administered 2022-03-08 (×2): 75 ug via INTRAVENOUS
  Administered 2022-03-08 (×3): 50 ug via INTRAVENOUS

## 2022-03-08 MED ORDER — ROCURONIUM BROMIDE 10 MG/ML (PF) SYRINGE
PREFILLED_SYRINGE | INTRAVENOUS | Status: AC
  Filled 2022-03-08: qty 10

## 2022-03-08 MED ORDER — LIDOCAINE IN D5W 4-5 MG/ML-% IV SOLN
INTRAVENOUS | Status: DC | PRN
  Administered 2022-03-08: 2 mg/min via INTRAVENOUS

## 2022-03-08 MED ORDER — LIDOCAINE IN D5W 4-5 MG/ML-% IV SOLN
INTRAVENOUS | Status: AC
Start: 2022-03-08 — End: ?
  Filled 2022-03-08: qty 500

## 2022-03-08 MED ORDER — SUCCINYLCHOLINE CHLORIDE 200 MG/10ML IV SOSY
PREFILLED_SYRINGE | INTRAVENOUS | Status: AC
  Filled 2022-03-08: qty 10

## 2022-03-08 MED ORDER — ALBUMIN HUMAN 5 % IV SOLN: INTRAVENOUS | Status: DC | PRN

## 2022-03-08 MED ORDER — SUCCINYLCHOLINE CHLORIDE 200 MG/10ML IV SOSY
PREFILLED_SYRINGE | INTRAVENOUS | Status: DC | PRN
  Administered 2022-03-08: 160 mg via INTRAVENOUS

## 2022-03-08 MED ORDER — ORAL CARE MOUTH RINSE
15.0000 mL | Freq: Once | OROMUCOSAL | Status: AC
  Administered 2022-03-09 (×2): 15 mL via OROMUCOSAL

## 2022-03-08 MED ORDER — BUPIVACAINE-EPINEPHRINE 0.25% -1:200000 IJ SOLN
INTRAMUSCULAR | Status: DC | PRN
  Administered 2022-03-08: 60 mL

## 2022-03-08 MED ORDER — PROPOFOL 500 MG/50ML IV EMUL
INTRAVENOUS | Status: DC | PRN
  Administered 2022-03-08: 20 ug/kg/min via INTRAVENOUS

## 2022-03-08 MED ORDER — ARTIFICIAL TEARS OPHTHALMIC OINT
TOPICAL_OINTMENT | OPHTHALMIC | Status: AC
  Filled 2022-03-08: qty 3.5

## 2022-03-08 MED ORDER — KETAMINE HCL 50 MG/ML IJ SOLN
INTRAMUSCULAR | Status: DC | PRN
  Administered 2022-03-08: 10 mg via INTRAMUSCULAR
  Administered 2022-03-08: 20 mg via INTRAMUSCULAR
  Administered 2022-03-08: 10 mg via INTRAMUSCULAR

## 2022-03-08 MED ORDER — ONDANSETRON HCL 4 MG/2ML IJ SOLN
INTRAMUSCULAR | Status: DC | PRN
  Administered 2022-03-08: 4 mg via INTRAVENOUS

## 2022-03-08 MED ORDER — PHENYLEPHRINE HCL-NACL 20-0.9 MG/250ML-% IV SOLN: 0.0000 ug/min | INTRAVENOUS | Status: DC

## 2022-03-08 MED ORDER — LACTATED RINGERS IV SOLN: INTRAVENOUS | Status: DC | PRN

## 2022-03-08 MED ORDER — MIDAZOLAM HCL 2 MG/2ML IJ SOLN
INTRAMUSCULAR | Status: DC | PRN
  Administered 2022-03-08: 2 mg via INTRAVENOUS

## 2022-03-08 MED ORDER — INSULIN ASPART 100 UNIT/ML IJ SOLN
0.0000 [IU] | INTRAMUSCULAR | Status: DC
  Administered 2022-03-08: 5 [IU] via SUBCUTANEOUS
  Administered 2022-03-09: 3 [IU] via SUBCUTANEOUS

## 2022-03-08 MED ORDER — POTASSIUM CHLORIDE 20 MEQ PO PACK: 40.0000 meq | PACK | Freq: Once | ORAL | Status: DC

## 2022-03-08 MED ORDER — VASOPRESSIN 20 UNIT/ML IV SOLN
INTRAVENOUS | Status: DC | PRN
  Administered 2022-03-08: 6 m[IU] via INTRAVENOUS

## 2022-03-08 MED ORDER — BUPIVACAINE LIPOSOME 1.3 % IJ SUSP
INTRAMUSCULAR | Status: AC
  Filled 2022-03-08: qty 20

## 2022-03-08 MED ORDER — 0.9 % SODIUM CHLORIDE (POUR BTL) OPTIME
TOPICAL | Status: DC | PRN
  Administered 2022-03-08: 1000 mL

## 2022-03-08 MED ORDER — CHLORHEXIDINE GLUCONATE 0.12 % MT SOLN
OROMUCOSAL | Status: AC
  Administered 2022-03-08: 15 mL via OROMUCOSAL
  Filled 2022-03-08: qty 15

## 2022-03-08 MED ORDER — BUPIVACAINE LIPOSOME 1.3 % IJ SUSP
INTRAMUSCULAR | Status: DC | PRN
  Administered 2022-03-08: 20 mL

## 2022-03-08 MED ORDER — PHENYLEPHRINE HCL-NACL 20-0.9 MG/250ML-% IV SOLN
INTRAVENOUS | Status: DC | PRN
  Administered 2022-03-08 (×2): 50 ug/min via INTRAVENOUS

## 2022-03-08 MED ORDER — PROPOFOL 10 MG/ML IV BOLUS
INTRAVENOUS | Status: DC | PRN
  Administered 2022-03-08: 110 mg via INTRAVENOUS

## 2022-03-08 MED ORDER — VASOPRESSIN 20 UNIT/ML IV SOLN
INTRAVENOUS | Status: AC
  Filled 2022-03-08: qty 1

## 2022-03-08 SURGICAL SUPPLY — 76 items
APPLIER CLIP 5 13 M/L LIGAMAX5 (MISCELLANEOUS)
CANNULA REDUC XI 12-8 STAPL (CANNULA) ×2
CANNULA REDUCER 12-8 DVNC XI (CANNULA) ×1 IMPLANT
CHLORAPREP W/TINT 26 (MISCELLANEOUS) ×2 IMPLANT
CLIP APPLIE 5 13 M/L LIGAMAX5 (MISCELLANEOUS) IMPLANT
COVER MAYO STAND STRL (DRAPES) ×2 IMPLANT
COVER SURGICAL LIGHT HANDLE (MISCELLANEOUS) ×2 IMPLANT
COVER TIP SHEARS 8 DVNC (MISCELLANEOUS) IMPLANT
COVER TIP SHEARS 8MM DA VINCI (MISCELLANEOUS) ×2
DEFOGGER SCOPE WARMER CLEARIFY (MISCELLANEOUS) ×2 IMPLANT
DERMABOND ADVANCED (GAUZE/BANDAGES/DRESSINGS) ×1
DERMABOND ADVANCED .7 DNX12 (GAUZE/BANDAGES/DRESSINGS) ×1 IMPLANT
DEVICE TROCAR PUNCTURE CLOSURE (ENDOMECHANICALS) ×2 IMPLANT
DRAIN CHANNEL 19F RND (DRAIN) ×1 IMPLANT
DRAIN PENROSE 0.5X18 (DRAIN) IMPLANT
DRAPE ARM DVNC X/XI (DISPOSABLE) ×4 IMPLANT
DRAPE CARDIOVASC SPLIT 88X140 (DRAPES) ×2 IMPLANT
DRAPE COLUMN DVNC XI (DISPOSABLE) ×1 IMPLANT
DRAPE DA VINCI XI ARM (DISPOSABLE) ×8
DRAPE DA VINCI XI COLUMN (DISPOSABLE) ×2
DRAPE ORTHO SPLIT 77X108 STRL (DRAPES) ×2
DRAPE SURG ORHT 6 SPLT 77X108 (DRAPES) ×1 IMPLANT
DRSG TEGADERM 2-3/8X2-3/4 SM (GAUZE/BANDAGES/DRESSINGS) ×4 IMPLANT
DRSG TEGADERM 4X4.75 (GAUZE/BANDAGES/DRESSINGS) ×2 IMPLANT
ELECT REM PT RETURN 9FT ADLT (ELECTROSURGICAL) ×2
ELECTRODE REM PT RTRN 9FT ADLT (ELECTROSURGICAL) ×1 IMPLANT
EVACUATOR SILICONE 100CC (DRAIN) ×1 IMPLANT
GLOVE BIO SURGEON STRL SZ7.5 (GLOVE) ×4 IMPLANT
GLOVE SURG SYN 7.5  E (GLOVE) ×6
GLOVE SURG SYN 7.5 E (GLOVE) ×3 IMPLANT
GLOVE SURG SYN 7.5 PF PI (GLOVE) ×3 IMPLANT
GOWN STRL REUS W/ TWL LRG LVL3 (GOWN DISPOSABLE) ×1 IMPLANT
GOWN STRL REUS W/ TWL XL LVL3 (GOWN DISPOSABLE) ×2 IMPLANT
GOWN STRL REUS W/TWL 2XL LVL3 (GOWN DISPOSABLE) ×2 IMPLANT
GOWN STRL REUS W/TWL LRG LVL3 (GOWN DISPOSABLE) ×2
GOWN STRL REUS W/TWL XL LVL3 (GOWN DISPOSABLE) ×4
KIT BASIN OR (CUSTOM PROCEDURE TRAY) ×2 IMPLANT
KIT TURNOVER KIT B (KITS) IMPLANT
MARKER SKIN DUAL TIP RULER LAB (MISCELLANEOUS) ×2 IMPLANT
MESH VENTRALIGHT ST 8X10 (Mesh General) ×1 IMPLANT
NDL INSUFFLATION 14GA 120MM (NEEDLE) ×1 IMPLANT
NDL SPNL 18GX3.5 QUINCKE PK (NEEDLE) IMPLANT
NEEDLE 22X1 1/2 (OR ONLY) (NEEDLE) ×3 IMPLANT
NEEDLE INSUFFLATION 14GA 120MM (NEEDLE) ×2 IMPLANT
NEEDLE SPNL 18GX3.5 QUINCKE PK (NEEDLE) ×2 IMPLANT
OBTURATOR OPTICAL STANDARD 8MM (TROCAR)
OBTURATOR OPTICAL STND 8 DVNC (TROCAR)
OBTURATOR OPTICALSTD 8 DVNC (TROCAR) IMPLANT
PENCIL SMOKE EVACUATOR (MISCELLANEOUS) IMPLANT
SCISSORS LAP 5X35 DISP (ENDOMECHANICALS) IMPLANT
SEAL CANN UNIV 5-8 DVNC XI (MISCELLANEOUS) ×3 IMPLANT
SEAL XI 5MM-8MM UNIVERSAL (MISCELLANEOUS) ×8
SEALER VESSEL DA VINCI XI (MISCELLANEOUS) ×2
SEALER VESSEL EXT DVNC XI (MISCELLANEOUS) ×1 IMPLANT
SET IRRIG TUBING LAPAROSCOPIC (IRRIGATION / IRRIGATOR) ×2 IMPLANT
SET TUBE SMOKE EVAC HIGH FLOW (TUBING) ×2 IMPLANT
SPIKE FLUID TRANSFER (MISCELLANEOUS) ×1 IMPLANT
STAPLER CANNULA SEAL DVNC XI (STAPLE) ×1 IMPLANT
STAPLER CANNULA SEAL XI (STAPLE) ×2
STAPLER VISISTAT 35W (STAPLE) IMPLANT
STOPCOCK 4 WAY LG BORE MALE ST (IV SETS) ×1 IMPLANT
STRIP CLOSURE SKIN 1/2X4 (GAUZE/BANDAGES/DRESSINGS) ×1 IMPLANT
SUT ETHIBOND 0 36 GRN (SUTURE) ×2 IMPLANT
SUT ETHIBOND 2 0 SH (SUTURE)
SUT ETHIBOND 2 0 SH 36X2 (SUTURE) IMPLANT
SUT MNCRL AB 4-0 PS2 18 (SUTURE) ×3 IMPLANT
SUT PDS AB 1 CTX 36 (SUTURE) ×2 IMPLANT
SUT PROLENE 1 CT (SUTURE) ×4 IMPLANT
SUT PROLENE 2 0 SH DA (SUTURE) ×1 IMPLANT
SUT SILK 0 SH 30 (SUTURE) ×2 IMPLANT
SUT VLOC 180 0 9IN  GS21 (SUTURE) ×8
SUT VLOC 180 0 9IN GS21 (SUTURE) IMPLANT
SYR 30ML SLIP (SYRINGE) ×1 IMPLANT
TRAY FOLEY MTR SLVR 16FR STAT (SET/KITS/TRAYS/PACK) ×2 IMPLANT
TRAY LAPAROSCOPIC MC (CUSTOM PROCEDURE TRAY) ×2 IMPLANT
TROCAR ADV FIXATION 5X100MM (TROCAR) ×2 IMPLANT

## 2022-03-08 NOTE — Transfer of Care (Signed)
Immediate Anesthesia Transfer of Care Note  Patient: Barbara Ward  Procedure(s) Performed: XI ROBOTIC ASSISTED RECURRENT DIAPHRAGMATIC HERNIA REPAIR (Abdomen)  Patient Location: PACU  Anesthesia Type:General  Level of Consciousness: Patient remains intubated per anesthesia plan  Airway & Oxygen Therapy: Patient remains intubated per anesthesia plan and Patient placed on Ventilator (see vital sign flow sheet for setting)  Post-op Assessment: Report given to RN and Post -op Vital signs reviewed and stable  Post vital signs: Reviewed and stable  Last Vitals:  Vitals Value Taken Time  BP 97/51 03/15/22 1930  Temp 36.4 C 03-15-2022 1930  Pulse 106 15-Mar-2022 1930  Resp 16 2022-03-15 1930  SpO2 100 % March 15, 2022 1935    Last Pain:  Vitals:   03-15-2022 1214  TempSrc:   PainSc: 0-No pain      Patients Stated Pain Goal: 0 (03/07/22 0458)  Complications:  Encounter Notable Events  Notable Event Outcome Phase Comment  Difficult to intubate - expected  Intraprocedure Filed from anesthesia note documentation.

## 2022-03-08 NOTE — Anesthesia Procedure Notes (Addendum)
Arterial Line Insertion Start/EndJul 21, 2023 6:30 PM Performed by: Marcene Duos, MD, anesthesiologist  Patient location: OR. Preanesthetic checklist: patient identified, IV checked, site marked, risks and benefits discussed, surgical consent, monitors and equipment checked, pre-op evaluation, timeout performed and anesthesia consent Right, radial was placed Catheter size: 20 G Hand hygiene performed  and maximum sterile barriers used   Attempts: 1 Procedure performed using ultrasound guided technique. Ultrasound Notes:anatomy identified, needle tip was noted to be adjacent to the nerve/plexus identified and no ultrasound evidence of intravascular and/or intraneural injection Following insertion, dressing applied and Biopatch. Post procedure assessment: normal and unchanged  Patient tolerated the procedure well with no immediate complications.

## 2022-03-08 NOTE — Progress Notes (Signed)
eLink Physician-Brief Progress Note Patient Name: Barbara Ward DOB: 02/16/1971 MRN: [id number]   Date of Service  03/19/22  HPI/Events of Note  Patient admitted to the ICU intubated and mechanically ventilated following operative repair of a diaphragmatic hernia.  eICU Interventions  New Patient evaluation.        Migdalia Dk Mar 19, 2022, 8:37 PM

## 2022-03-08 NOTE — Consult Note (Signed)
NAME:  Barbara Ward, MRN:  [id number], DOB:  [birth year], LOS: 2 ADMISSION DATE:  02/23/2022, CONSULTATION DATE:  03/16/2022 REFERRING MD:  Michaell Cowing, CHIEF COMPLAINT:  SBO, hernia repair  History of Present Illness:  Barbara Ward is a 51 y.o. F with PMH significant for Prader-Willi syndrome, HL, cognitive deficit, OSA, hypothyroidism, prior abdominal hernia repair 2019 who presented for abdominal pain on 7/15 and was found to have very large diaphramatic hernia with evidence of SBO.  She denied vomiting on admission and labs were largely WNL.   NG tube was placed and she was seen by surgery who recommended initial medical management; pt admitted Internal Medicine.  The decision to move forward with operative repair made as surgery felt risk of waiting outweighed benefit and she underwent operative repair 7/18.  She had an episode of hypotension and ET CO2 changes, therefore the decision made not to extubate immediately post-operatively and pt was transferred to ICU and PCCM consulted.  OP note mentions significant mediastinal adhesions with dense involvement of the R Pleura and 50% into the L pleura that required transection.  Pt had an episode of decreased end-tidal CO2 and crepitus of the chest, face and neck and large volume CO2 was evacuated.  No post-op pneumothorax or pneumomediastinum, JP drain in the R pleural space.  Pertinent  Medical History   has a past medical history of Allergic rhinitis, Hyperlipidemia, Hypokalemia, MR (mental retardation), Prader-Willi syndrome, Sleep apnea, and Thyroid disease.   Significant Hospital Events: Including procedures, antibiotic start and stop dates in addition to other pertinent events   7/15 admitted for medical management of recurrent diaphragmatic hernia with SBO 7/18 Robotic assisted operative hernia repair, episode hypotension so left intubated and transferred to ICU, on Neo, received [place] and Vanc  Interim History / Subjective:  Pt transferred to the  ICU on propofol and low dose Neo   Objective   Blood pressure 106/73, pulse 74, temperature 98.4 F (36.9 C), resp. rate 17, height 4\' 8"  (1.422 m), weight 79.4 kg, SpO2 92 %.        Intake/Output Summary (Last 24 hours) at 02/28/2022 1931 Last data filed at 03/07/2022 1900 Gross per 24 hour  Intake 2657.5 ml  Output 325 ml  Net 2332.5 ml   Filed Weights   03/14/2022 1101  Weight: 79.4 kg   General:  critically ill F, edematous, intubated and sedated HEENT: MM pink/moist, ETT in place, crepitus of the face, neck, chest Neuro: examined on propofol, RASS -5 CV: s1s2 rrr, no m/r/g PULM:  no significant rhonchi or wheezing, on full vent support GI: soft, JP drain entering the RUQ, blood sero-sanguineous drainage Extremities: warm/dry, 1+ edema  Skin: no rashes or lesions, patchy chest and UE erythema   Resolved Hospital Problem list     Assessment & Plan:    Post-op Ventilator Management Left intubated in the setting of transient hypotension and drop om End tidal CO2 --Maintain full vent support with SAT/SBT as tolerated -titrate Vent setting to maintain SpO2 greater than or equal to 90%. -HOB elevated 30 degrees. -Plateau pressures less than 30 cm H20.  -Follow chest x-ray, ABG prn.   -Bronchial hygiene and RT/bronchodilator protocol. -propofol and Dilaudid for pain and sedation   Large diaphragmatic hernia with SBO With adhesions to the mediastinum, L and R pleura -management per surgery -JP drain in place -CXR without pneumothorax and pneumomediastinum -repeat CXR in AM -got one dose Vanc and Gent   Hypotension -Transient during surgery, required low  dose Neo on presentation to the ICU -check lactic acid -monitor, replace A-line -possibly due to sedation   Hypokalemia K 3.1, repleted -check repeat labs including Mag and replace prn    Best Practice (right click and "Reselect all SmartList Selections" daily)   Diet/type: NPO DVT prophylaxis: SCD GI  prophylaxis: PPI Lines: N/A Foley:  Yes, and it is still needed Code Status:  full code Last date of multidisciplinary goals of care discussion [pending, family updated by surgery]  Labs   CBC: Recent Labs  Lab 03/13/2022 1108 03/06/22 0450 03/07/22 0244 02/27/2022 0551  WBC 8.5 7.2 6.3 7.5  HGB 15.5* 13.4 13.6 13.4  HCT 46.6* 42.8 43.0 39.9  MCV 93.6 100.0 97.9 94.3  PLT 274 211 223 228    Basic Metabolic Panel: Recent Labs  Lab 03/04/2022 1108 03/06/22 0552 03/07/22 0244 03/10/2022 0551  NA 142 139 144 138  K 3.5 3.5 3.5 3.1*  CL 102 103 109 106  CO2 25 26 24 26   GLUCOSE 125* 100* 76 124*  BUN 14 10 11  5*  CREATININE 0.91 0.91 0.82 0.73  CALCIUM 9.5 8.3* 8.2* 8.7*  MG  --  2.1  --   --   PHOS  --  2.7  --   --    GFR: Estimated Creatinine Clearance: 71.8 mL/min (by C-G formula based on SCr of 0.73 mg/dL). Recent Labs  Lab 03/07/2022 1108 03/06/22 0450 03/07/22 0244 02/23/2022 0551  WBC 8.5 7.2 6.3 7.5    Liver Function Tests: Recent Labs  Lab 03/13/2022 1108  AST 22  ALT 16  ALKPHOS 78  BILITOT 1.1  PROT 7.0  ALBUMIN 3.7   Recent Labs  Lab 02/24/2022 1108  LIPASE 24   No results for input(s): "AMMONIA" in the last 168 hours.  ABG No results found for: "PHART", "PCO2ART", "PO2ART", "HCO3", "TCO2", "ACIDBASEDEF", "O2SAT"   Coagulation Profile: No results for input(s): "INR", "PROTIME" in the last 168 hours.  Cardiac Enzymes: No results for input(s): "CKTOTAL", "CKMB", "CKMBINDEX", "TROPONINI" in the last 168 hours.  HbA1C: No results found for: "HGBA1C"  CBG: No results for input(s): "GLUCAP" in the last 168 hours.  Review of Systems:   Unable to obtain  Past Medical History:  She,  has a past medical history of Allergic rhinitis, Hyperlipidemia, Hypokalemia, MR (mental retardation), Prader-Willi syndrome, Sleep apnea, and Thyroid disease.   Surgical History:   Past Surgical History:  Procedure Laterality Date   ESOPHAGOGASTRODUODENOSCOPY  (EGD) WITH PROPOFOL N/A 02/25/2018   Procedure: ESOPHAGOGASTRODUODENOSCOPY (EGD) WITH PROPOFOL;  Surgeon: 03/07/22, MD;  Location: [facility] ENDOSCOPY;  Service: Gastroenterology;  Laterality: N/A;   HERNIA REPAIR     HIATAL HERNIA REPAIR N/A 02/27/2018   Procedure: LAPAROSCOPIC REPAIR OF HIATAL HERNIA;  Surgeon: Kathi Der, MD;  Location: [facility];  Service: General;  Laterality: N/A;   LAPAROSCOPIC GASTROSTOMY N/A 02/27/2018   Procedure: LAPAROSCOPIC GASTROSTOMY;  Surgeon: [facility], MD;  Location: [facility] OR;  Service: General;  Laterality: N/A;   THYROIDECTOMY       Social History:   reports that she has never smoked. She has never used smokeless tobacco. She reports that she does not drink alcohol and does not use drugs.   Family History:  Her family history is not on file.   Allergies Allergies  Allergen Reactions   Codeine Other (See Comments)    Unknown reaction   Penicillins Other (See Comments)    Unknown reaction   Tape Other (See Comments)    "  surgical tape" - unknown reaction     Home Medications  Prior to Admission medications   Medication Sig Start Date End Date Taking? Authorizing Provider  albuterol (VENTOLIN HFA) 108 (90 Base) MCG/ACT inhaler Inhale 2 puffs into the lungs every 6 (six) hours as needed for wheezing or shortness of breath.   Yes [provider]  ALPRAZolam (XANAX) 0.25 MG tablet Take 0.25 mg by mouth every 12 (twelve) hours.   Yes [provider]  atorvastatin (LIPITOR) 40 MG tablet Take 1 tablet (40 mg total) by mouth daily at 6 PM. 03/06/18  Yes Riccio, Angela C, DO  busPIRone (BUSPAR) 15 MG tablet Take 15 mg by mouth 2 (two) times daily.   Yes [provider]  busPIRone (BUSPAR) 15 MG tablet Take 15 mg by mouth 3 (three) times daily.   Yes [provider]  Cholecalciferol (VITAMIN D3) 50 MCG (2000 UT) capsule Take 2,000 Units by mouth daily.   Yes [provider]  citalopram (CELEXA) 40 MG tablet Take 40 mg  by mouth daily.   Yes [provider]  docusate sodium (COLACE) 100 MG capsule Take 100 mg by mouth every evening.   Yes [provider]  fluticasone (FLONASE) 50 MCG/ACT nasal spray Place 1 spray into both nostrils every evening.    Yes [provider]  furosemide (LASIX) 40 MG tablet Take 40 mg by mouth daily.   Yes [provider]  glucosamine-chondroitin 500-400 MG tablet Take 1 tablet by mouth every morning.   Yes [provider]  levothyroxine (SYNTHROID, LEVOTHROID) 137 MCG tablet Take 137 mcg by mouth daily before breakfast.   Yes [provider]  loratadine (CLARITIN) 10 MG tablet Take 10 mg by mouth every morning.   Yes [provider]  Multiple Vitamin (MULTIVITAMIN WITH MINERALS) TABS tablet Take 1 tablet by mouth every morning.   Yes [provider]  potassium chloride (K-DUR) 10 MEQ tablet Take 10 mEq by mouth every morning.   Yes [provider]  PSYLLIUM FIBER PO Take 1 tablet by mouth in the morning and at bedtime.   Yes [provider]  ramipril (ALTACE) 10 MG capsule Take 10 mg by mouth daily.   Yes [provider]  Zinc Oxide (DESITIN RAPID RELIEF) 13 % CREA Apply 1 Application topically daily as needed (Apply to buttock and genital area after using restroom).   Yes [provider]     Critical care time: 50 minutes    CRITICAL CARE Performed by: Otilio Carpen Albertia Carvin   Total critical care time: 50 minutes  Critical care time was exclusive of separately billable procedures and treating other patients.  Critical care was necessary to treat or prevent imminent or life-threatening deterioration.  Critical care was time spent personally by me on the following activities: development of treatment plan with patient and/or surrogate as well as nursing, discussions with consultants, evaluation of patient's response to treatment, examination of patient, obtaining history from  patient or surrogate, ordering and performing treatments and interventions, ordering and review of laboratory studies, ordering and review of radiographic studies, pulse oximetry and re-evaluation of patient's condition.   Otilio Carpen Linsi Humann, PA-C [place] [facility] See Amion for pager If no response to pager , please call [phone] until 7pm After 7:00 pm call Elink  [phone]?[place]

## 2022-03-08 NOTE — Progress Notes (Signed)
Patient assessed for USGPIV for vasopressors.  No sites found with ultrasound bilaterally.  Patient has veins that are >45% occupancy with 24g catheter.  Recommend alternate venous access such as central line.  Ginger, RN updated.  Request 2nd VAST team member assess patient.  Patient remains on work list for second assess.

## 2022-03-08 NOTE — Anesthesia Preprocedure Evaluation (Addendum)
Anesthesia Evaluation  Patient identified by MRN, date of birth, ID band Patient awake    Reviewed: Allergy & Precautions, NPO status , Patient's Chart, lab work & pertinent test results  Airway Mallampati: III  TM Distance: <3 FB Neck ROM: Full    Dental  (+) Missing, Dental Advisory Given,    Pulmonary sleep apnea and Continuous Positive Airway Pressure Ventilation ,    Pulmonary exam normal breath sounds clear to auscultation       Cardiovascular Exercise Tolerance: Good Normal cardiovascular exam Rhythm:Regular Rate:Normal     Neuro/Psych devlopemental delaynegative neurological ROS  negative psych ROS   GI/Hepatic negative GI ROS, Neg liver ROS,   Endo/Other  Hypothyroidism   Renal/GU Lab Results      Component                Value               Date                      CREATININE               0.73                03/20/2022                K                        3.1 (L)             03/02/2022                     Musculoskeletal negative musculoskeletal ROS (+)   Abdominal   Peds  Hematology Lab Results      Component                Value               Date                      WBC                      7.5                 03/07/2022                HGB                      13.4                03/17/2022                HCT                      39.9                03/05/2022                MCV                      94.3                03/11/2022                PLT  228                 03/18/2022                           Anesthesia Other Findings Prader willi  All: codeine PCN TApe  Reproductive/Obstetrics                           Anesthesia Physical Anesthesia Plan  ASA: 3  Anesthesia Plan: General   Post-op Pain Management: Lidocaine infusion* and Ketamine IV*   Induction: Intravenous  PONV Risk Score and Plan: Treatment may vary due to age or medical  condition, Midazolam and Ondansetron  Airway Management Planned: Oral ETT and Video Laryngoscope Planned  Additional Equipment: None  Intra-op Plan:   Post-operative Plan: Extubation in OR  Informed Consent: I have reviewed the patients History and Physical, chart, labs and discussed the procedure including the risks, benefits and alternatives for the proposed anesthesia with the patient or authorized representative who has indicated his/her understanding and acceptance.     Dental advisory given  Plan Discussed with:   Anesthesia Plan Comments:        Anesthesia Quick Evaluation

## 2022-03-08 NOTE — Progress Notes (Signed)
Arrived for IV consult.  Pt was in procedure with provider and unavailable.  Ginger RN made aware that we will return.

## 2022-03-08 NOTE — Progress Notes (Signed)
RT transported vent patient from PACU to 66M. Vital signs stable through out. Report given to unit RT.

## 2022-03-08 NOTE — Progress Notes (Signed)
                  Subjective:   Hospital day 2.  Interval events: No events overnight.  Tolerating liquid diet well.  No abdominal pain.  No nausea.  No vomiting.  Passing gas and moving bowels.  Interviewed with family present.  Objective:  Vital signs: Blood pressure 106/73, pulse 74, temperature 98.4 F (36.9 C), resp. rate 17, height 4\' 8"  (1.422 m), weight 79.4 kg, SpO2 92 %.  Physical exam: Constitutional: Female sitting upright in bed.  No apparent distress. Pulmonary: Normal work of breathing noted. Skin: Warm and dry. Neuro: Alert and oriented. Psych: Pleasant mood and affect.   Intake/Output Summary (Last 24 hours) at 02/27/2022 1450 Last data filed at 02/23/2022 1430 Gross per 24 hour  Intake 500 ml  Output 200 ml  Net 300 ml    Pertinent Labs:    Latest Ref Rng & Units 02/27/2022    5:51 AM 03/07/2022    2:44 AM 03/06/2022    4:50 AM  CBC  WBC 4.0 - 10.5 K/uL 7.5  6.3  7.2   Hemoglobin 12.0 - 15.0 g/dL 02/27/2022  40.8  14.4   Hematocrit 36.0 - 46.0 % 39.9  43.0  42.8   Platelets 150 - 400 K/uL 228  223  211        Latest Ref Rng & Units 03/17/2022    5:51 AM 03/07/2022    2:44 AM 03/06/2022    5:52 AM  CMP  Glucose 70 - 99 mg/dL 03/06/2022  76  563   BUN 6 - 20 mg/dL 5  11  10    Creatinine 0.44 - 1.00 mg/dL 149   7.02   Sodium 135 - 145 mmol/L 138  144  139   Potassium 3.5 - 5.1 mmol/L 3.1  3.5  3.5   Chloride 98 - 111 mmol/L 106  109  103   CO2 22 - 32 mmol/L 26  24  26    Calcium 8.9 - 10.3 mg/dL 8.7  8.2  8.3     Assessment/Plan:   Principal Problem:   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 with a PMH of Prader-Willi syndrome, diaphragmatic hernia, who presented with nausea and abdominal pain and was admitted for small bowel obstruction.  SBO Diaphragmatic hernia Surgery today. - Continue Dilaudid 0.5 mg IV every 6 hours as  needed for severe pain - Continue acetaminophen 650 mg p.o./rectal every 6 hours as needed for mild pain or fever - Continue Zofran 4 mg p.o./IV every 6 hours as needed for nausea  Anxiety Chronic prescription benzodiazepine use - Continue lorazepam 0.5 mg IV every 6 hours as needed while n.p.o. for anxiety, seizure, and benzodiazepine withdrawal syndrome.  Chronic stable medical conditions Restart home medicines after surgery.  Refer to 03/06/2022 progress note.  Diet: NPO IVF: None,None VTE: Enoxaparin Code: Full PT/OT recs: None, none. Family Update: Family present   Dispo: Anticipated discharge to Home in 1 days pending surgery.   Alvera Singh, MD 02/27/2022, 2:50 PM  Pager: [phone] After 5pm on weekdays and 1pm on weekends: On Call pager: [phone]

## 2022-03-08 NOTE — Interval H&P Note (Signed)
History and Physical Interval Note:  03/18/2022 11:49 AM  Barbara Ward  has presented today for surgery, with the diagnosis of recurrent diaphramatic hernia.  The various methods of treatment have been discussed with the patient and family. After consideration of risks, benefits and other options for treatment, the patient has consented to  Procedure(s): XI ROBOTIC ASSISTED DIAPHRAGMATIC HERNIA REPAIR (N/A) as a surgical intervention.  The patient's history has been reviewed, patient examined, no change in status, stable for surgery.  I have reviewed the patient's chart and labs.  Questions were answered to the patient's satisfaction.    I have re-reviewed the the patient's records, history, medications, and allergies.  I have re-examined the patient.  I again discussed intraoperative plans and goals of post-operative recovery.  The patient agrees to proceed.  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   Lymphedema of leg 02/16/2016    Past Medical History:  Diagnosis Date   Allergic rhinitis    Hyperlipidemia    Hypokalemia    MR (mental retardation)    Prader-Willi syndrome    Sleep apnea    Thyroid disease     Past Surgical History:  Procedure Laterality Date   ESOPHAGOGASTRODUODENOSCOPY (EGD) WITH PROPOFOL N/A 02/25/2018   Procedure: ESOPHAGOGASTRODUODENOSCOPY (EGD) WITH PROPOFOL;  Surgeon: Kathi Der, MD;   Location: [facility];  Service: Gastroenterology;  Laterality: N/A;   HERNIA REPAIR     HIATAL HERNIA REPAIR N/A 02/27/2018   Procedure: LAPAROSCOPIC REPAIR OF HIATAL HERNIA;  Surgeon: Ovidio Kin, MD;  Location: [facility] OR;  Service: General;  Laterality: N/A;   LAPAROSCOPIC GASTROSTOMY N/A 02/27/2018   Procedure: LAPAROSCOPIC GASTROSTOMY;  Surgeon: Ovidio Kin, MD;  Location: [facility] OR;  Service: General;  Laterality: N/A;   THYROIDECTOMY      Social History   Socioeconomic History   Marital status: Single    Spouse name: Not on file   Number of children: Not on file   Years of education: Not on file   Highest education level: Not on file  Occupational History   Not on file  Tobacco Use   Smoking status: Never   Smokeless tobacco: Never  Substance and Sexual Activity   Alcohol use: No   Drug use: No   Sexual activity: Not on file  Other Topics Concern   Not on file  Social History Narrative   Not on file   Social Determinants of Health   Financial Resource Strain: Not on file  Food Insecurity: Not on file  Transportation Needs: Not on file  Physical Activity: Not on file  Stress: Not on file  Social Connections: Not on file  Intimate Partner Violence: Not on file    History reviewed. No pertinent family history.  Medications Prior to Admission  Medication Sig Dispense Refill Last Dose   albuterol (VENTOLIN HFA) 108 (90 Base) MCG/ACT inhaler Inhale 2 puffs into the lungs every 6 (six) hours as needed for wheezing or shortness of breath.  unknown   ALPRAZolam (XANAX) 0.25 MG tablet Take 0.25 mg by mouth every 12 (twelve) hours.   03/04/2022   atorvastatin (LIPITOR) 40 MG tablet Take 1 tablet (40 mg total) by mouth daily at 6 PM. 30 tablet 0 03/04/2022   busPIRone (BUSPAR) 15 MG tablet Take 15 mg by mouth 2 (two) times daily.   03/04/2022   busPIRone (BUSPAR) 15 MG tablet Take 15 mg by mouth 3 (three) times daily.   03/04/2022   Cholecalciferol (VITAMIN D3) 50 MCG (2000 UT)  capsule Take 2,000 Units by mouth daily.   03/04/2022   citalopram (CELEXA) 40 MG tablet Take 40 mg by mouth daily.   03/04/2022   docusate sodium (COLACE) 100 MG capsule Take 100 mg by mouth every evening.   03/04/2022   fluticasone (FLONASE) 50 MCG/ACT nasal spray Place 1 spray into both nostrils every evening.    03/04/2022   furosemide (LASIX) 40 MG tablet Take 40 mg by mouth daily.   02/28/2022   glucosamine-chondroitin 500-400 MG tablet Take 1 tablet by mouth every morning.   03/04/2022   levothyroxine (SYNTHROID, LEVOTHROID) 137 MCG tablet Take 137 mcg by mouth daily before breakfast.   03/04/2022   loratadine (CLARITIN) 10 MG tablet Take 10 mg by mouth every morning.   03/04/2022   Multiple Vitamin (MULTIVITAMIN WITH MINERALS) TABS tablet Take 1 tablet by mouth every morning.   03/04/2022   potassium chloride (K-DUR) 10 MEQ tablet Take 10 mEq by mouth every morning.   03/04/2022   PSYLLIUM FIBER PO Take 1 tablet by mouth in the morning and at bedtime.   03/04/2022   ramipril (ALTACE) 10 MG capsule Take 10 mg by mouth daily.   03/04/2022   Zinc Oxide (DESITIN RAPID RELIEF) 13 % CREA Apply 1 Application topically daily as needed (Apply to buttock and genital area after using restroom).   unknown    Current Facility-Administered Medications  Medication Dose Route Frequency Provider Last Rate Last Admin   acetaminophen (TYLENOL) tablet 650 mg  650 mg Oral Q6H PRN Lacinda Axon, MD   650 mg at 03/06/22 0813   Or   acetaminophen (TYLENOL) suppository 650 mg  650 mg Rectal Q6H PRN Lacinda Axon, MD       acetaminophen (TYLENOL) tablet 1,000 mg  1,000 mg Oral On Call to OR Michael Boston, MD       albuterol (PROVENTIL) (2.5 MG/3ML) 0.083% nebulizer solution 2.5 mg  2.5 mg Nebulization Q4H PRN Nani Gasser, MD       bupivacaine liposome (EXPAREL) 1.3 % injection 266 mg  20 mL Infiltration Once Michael Boston, MD       celecoxib (CELEBREX) capsule 200 mg  200 mg Oral On Call to OR Michael Boston, MD       enoxaparin (LOVENOX) injection 40 mg  40 mg Subcutaneous Q24H Nani Gasser, MD   40 mg at 03/07/22 2149   feeding supplement (ENSURE PRE-SURGERY) liquid 592 mL  592 mL Oral Once Michael Boston, MD       gabapentin (NEURONTIN) capsule 300 mg  300 mg Oral On Call to OR Michael Boston, MD       gentamicin (GARAMYCIN) 300 mg in dextrose 5 % 100 mL IVPB  300 mg Intravenous On Call to OR Michael Boston, MD       HYDROmorphone (DILAUDID) injection 0.5 mg  0.5 mg Intravenous Q6H PRN Lacinda Axon, MD   0.5 mg at 03/07/22 0458   LORazepam (ATIVAN)  injection 0.5 mg  0.5 mg Intravenous Q6H PRN Nani Gasser, MD   0.5 mg at 03/06/22 1427   ondansetron (ZOFRAN) tablet 4 mg  4 mg Oral Q6H PRN Lacinda Axon, MD       Or   ondansetron [facility]) injection 4 mg  4 mg Intravenous Q6H PRN Lacinda Axon, MD       potassium chloride (KLOR-CON) packet 40 mEq  40 mEq Oral Once Nani Gasser, MD       senna-docusate (Senokot-S) tablet 1 tablet  1 tablet Oral QHS PRN Lacinda Axon, MD       vancomycin (VANCOREADY) IVPB 1500 mg/300 mL  1,500 mg Intravenous On Call to OR Skeet Simmer, [facility]         Allergies  Allergen Reactions   Codeine Other (See Comments)    Unknown reaction   Penicillins Other (See Comments)    Unknown reaction   Tape Other (See Comments)    "surgical tape" - unknown reaction    BP (!) 125/56 (BP Location: Right Arm)   Pulse 84   Temp 98.5 F (36.9 C) (Oral)   Resp 17   Ht 4\' 8"  (1.422 m)   Wt 79.4 kg   SpO2 97%   BMI 39.23 kg/m   Labs: Results for orders placed or performed during the hospital encounter of 02/26/2022 (from the past 48 hour(s))  MRSA Next Gen by PCR, Nasal     Status: None   Collection Time: 03/06/22  7:36 PM   Specimen: Nasal Mucosa; Nasal Swab  Result Value Ref Range   MRSA by PCR Next Gen NOT DETECTED NOT DETECTED    Comment: (NOTE) The GeneXpert MRSA Assay (FDA approved for NASAL specimens only), is one  component of a comprehensive MRSA colonization surveillance program. It is not intended to diagnose MRSA infection nor to guide or monitor treatment for MRSA infections. Test performance is not FDA approved in patients less than 2 years old. Performed at [facility], [place] [address], [place], [place] [address]   CBC     Status: None   Collection Time: 03/07/22  2:44 AM  Result Value Ref Range   WBC 6.3 4.0 - 10.5 K/uL   RBC 4.39 3.87 - 5.11 MIL/uL   Hemoglobin 13.6 12.0 - 15.0 g/dL   HCT 43.0 36.0 - 46.0 %   MCV 97.9 80.0 - 100.0 fL   MCH 31.0 26.0 - 34.0 pg   MCHC 31.6 30.0 - 36.0 g/dL   RDW 14.0 11.5 - 15.5 %   Platelets 223 150 - 400 K/uL   nRBC 0.0 0.0 - 0.2 %    Comment: Performed at [facility], [place] [address] [place], [place] [id number]  Basic metabolic panel     Status: Abnormal   Collection Time: 03/07/22  2:44 AM  Result Value Ref Range   Sodium 144 135 - 145 mmol/L   Potassium 3.5 3.5 - 5.1 mmol/L   Chloride 109 98 - 111 mmol/L   CO2 24 22 - 32 mmol/L   Glucose, Bld 76 70 - 99 mg/dL    Comment: Glucose reference range applies only to samples taken after fasting for at least 8 hours.   BUN 11 6 - 20 mg/dL   Creatinine, Ser 0.82 0.44 - 1.00 mg/dL   Calcium 8.2 (L) 8.9 - 10.3 mg/dL   GFR, Estimated >60 >60 mL/min    Comment: (NOTE) Calculated using the CKD-EPI Creatinine Equation (2021)  Anion gap 11 5 - 15    Comment: Performed at [facility], [address], [place], [place] [address]  CBC     Status: None   Collection Time: 03/15/2022  5:51 AM  Result Value Ref Range   WBC 7.5 4.0 - 10.5 K/uL   RBC 4.23 3.87 - 5.11 MIL/uL   Hemoglobin 13.4 12.0 - 15.0 g/dL   HCT 12.8 78.6 - 76.7 %   MCV 94.3 80.0 - 100.0 fL   MCH 31.7 26.0 - 34.0 pg   MCHC 33.6 30.0 - 36.0 g/dL   RDW 20.9 47.0 - 96.2 %   Platelets 228 150 - 400 K/uL   nRBC 0.0 0.0 - 0.2 %    Comment: Performed at [facility], [address], [place], [place] [address]  Basic  metabolic panel     Status: Abnormal   Collection Time: 03/01/2022  5:51 AM  Result Value Ref Range   Sodium 138 135 - 145 mmol/L   Potassium 3.1 (L) 3.5 - 5.1 mmol/L   Chloride 106 98 - 111 mmol/L   CO2 26 22 - 32 mmol/L   Glucose, Bld 124 (H) 70 - 99 mg/dL    Comment: Glucose reference range applies only to samples taken after fasting for at least 8 hours.   BUN 5 (L) 6 - 20 mg/dL   Creatinine, Ser 9.47 0.44 - 1.00 mg/dL   Calcium 8.7 (L) 8.9 - 10.3 mg/dL   GFR, Estimated >65 >46 mL/min    Comment: (NOTE) Calculated using the CKD-EPI Creatinine Equation (2021)    Anion gap 6 5 - 15    Comment: Performed at [facility], [address] [place], [place] [address]    Imaging / Studies: CT ABDOMEN PELVIS W CONTRAST  Result Date: 02/27/2022 CLINICAL DATA:  Generalized abdominal pain since yesterday EXAM: CT ABDOMEN AND PELVIS WITH CONTRAST TECHNIQUE: Multidetector CT imaging of the abdomen and pelvis was performed using the standard protocol following bolus administration of intravenous contrast. RADIATION DOSE REDUCTION: This exam was performed according to the departmental dose-optimization program which includes automated exposure control, adjustment of the mA and/or kV according to patient size and/or use of iterative reconstruction technique. CONTRAST:  61mL OMNIPAQUE IOHEXOL 300 MG/ML  SOLN COMPARISON:  02/23/2018 FINDINGS: Lower chest: Since the prior exam, and anterior diaphragmatic hernia has enlarged, now containing numerous loops of small bowel as well as transverse colon. No acute airspace disease, effusion, or pneumothorax. Hepatobiliary: No focal liver abnormality is seen. No gallstones, gallbladder wall thickening, or biliary dilatation. Pancreas: Unremarkable. No pancreatic ductal dilatation or surrounding inflammatory changes. Spleen: Normal in size without focal abnormality. Adrenals/Urinary Tract: Adrenal glands are unremarkable. Kidneys are normal, without renal calculi,  focal lesion, or hydronephrosis. Bladder is unremarkable. Stomach/Bowel: As discussed above, there is a large anterior diaphragmatic hernia, extending anterior to the heart and occupying a significant portion of the right anterior hemithorax. This diaphragmatic hernia contains large portions of the proximal small bowel extending from the duodenum through at least the mid jejunum. Portions of the transverse colon are also contained within the hernia sac. There are multiple dilated loops of small bowel within the hernia sac, measuring up to 3.9 cm in diameter, with numerous gas fluid levels, consistent with obstruction. Within the right hemiabdomen, separate from the hernia sac, there are additional dilated loops of distal jejunum or ileum measuring up to 2.9 cm in diameter, representing a second segment of small bowel obstruction.  The stomach, normally located below the left hemidiaphragm, is also distended. Decompression with enteric catheter may be useful. I do not see any bowel wall thickening or inflammatory change. The appendix cannot be identified. Vascular/Lymphatic: No significant vascular findings are present. No enlarged abdominal or pelvic lymph nodes. Reproductive: Uterus and bilateral adnexa are unremarkable. Other: As discussed above, there is a large anterior diaphragmatic hernia. The defect within the anterior aspect of the diaphragm measures approximately 5.2 cm in AP dimension and at least 6.5 cm in transverse dimension. There is no free fluid or free intraperitoneal gas. Musculoskeletal: No acute or destructive bony lesions. Reconstructed images demonstrate no additional findings. IMPRESSION: 1. Large anterior diaphragmatic hernia, markedly increased since prior study. Significant portions of large and small bowel extend into the diaphragmatic hernia, with segmental small-bowel obstruction within the hernia sac as well as within the right mid abdomen as above. No evidence of bowel ischemia. 2.  Distension of the stomach, which is normally located in the left upper quadrant, likely as result of herniation of the duodenum described above. Decompression with enteric catheter may be useful. Electronically Signed   By: Randa Ngo M.D.   On: 02/21/2022 16:26     .Adin Hector, M.D., F.A.C.S. Gastrointestinal and Minimally Invasive Surgery [facility] [address] [place], [place] [address] [phone] Main / Paging  02/24/2022 11:49 AM    Adin Hector

## 2022-03-08 NOTE — TOC Initial Note (Signed)
Transition of Care [facility]) - Initial/Assessment Note    Patient Details  Name: Barbara Ward MRN: [id number] Date of Birth: 1970-12-26  [facility]) CM/SW Contact:    Milinda Antis, LCSWA Phone Number: 02/24/2022, 11:20 AM  Clinical Narrative:                 CSW met with the patient, patient's sister, patient's parents, and patients associate pastor at bedside.  The patient is from a group home associated with [place].  The phone number is [phone], but staff is not available until after 1430.  The patient will go to surgery today.  TOC will continue to follow.    Expected Discharge Plan: Group Home Barriers to Discharge: Continued Medical Work up   Patient Goals and CMS Choice Patient states their goals for this hospitalization and ongoing recovery are:: To return to group home      Expected Discharge Plan and Services Expected Discharge Plan: [place] arrangements for the past 2 months: Group Home                                      Prior Living Arrangements/Services Living arrangements for the past 2 months: Group Home Lives with:: Facility Resident Patient language and need for interpreter reviewed:: Yes Do you feel safe going back to the place where you live?: Yes      Need for Family Participation in Patient Care: Yes (Comment) Care giver support system in place?: Yes (comment)   Criminal Activity/Legal Involvement Pertinent to Current Situation/Hospitalization: No - Comment as needed  Activities of Daily Living Home Assistive Devices/Equipment: Environmental consultant (specify type) (rolator) ADL Screening (condition at time of admission) Patient's cognitive ability adequate to safely complete daily activities?: No Is the patient deaf or have difficulty hearing?: No Does the patient have difficulty seeing, even when wearing glasses/contacts?: No Does the patient have difficulty concentrating, remembering, or  making decisions?: No Patient able to express need for assistance with ADLs?: Yes Does the patient have difficulty dressing or bathing?: Yes Independently performs ADLs?: No Communication: Independent Dressing (OT): Needs assistance Is this a change from baseline?: Pre-admission baseline Grooming: Needs assistance Is this a change from baseline?: Pre-admission baseline Feeding: Independent Bathing: Needs assistance Is this a change from baseline?: Pre-admission baseline Toileting: Needs assistance Is this a change from baseline?: Pre-admission baseline In/Out Bed: Needs assistance Is this a change from baseline?: Pre-admission baseline Walks in Home: Needs assistance Is this a change from baseline?: Pre-admission baseline Does the patient have difficulty walking or climbing stairs?: Yes Weakness of Legs: Both Weakness of Arms/Hands: None  Permission Sought/Granted Permission sought to share information with : Family Supports Permission granted to share information with : Yes, Verbal Permission Granted     Permission granted to share info w AGENCY: Group home        Emotional Assessment   Attitude/Demeanor/Rapport: Engaged Affect (typically observed): Flat Orientation: : Oriented to Self, Oriented to Place, Oriented to Situation Alcohol / Substance Use: Not Applicable Psych Involvement: No (comment)  Admission diagnosis:  Small bowel obstruction ([place]) [K56.609] Diaphragmatic hernia with obstruction, without gangrene [K44.0] Generalized abdominal pain [R10.84] Patient Active Problem List   Diagnosis Date Noted   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 [place] [address] Phone: [phone] Fax: [phone]     Social Determinants of Health (SDOH) Interventions    Readmission Risk Interventions     No data to display

## 2022-03-08 NOTE — Op Note (Addendum)
03/07/2022  7:25 PM  PATIENT:  Barbara Ward  51 y.o. 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  Delay start of Pharmacological VTE agent (>24hrs) due to surgical blood loss or risk of bleeding:  no  ANESTHESIA: 1. General anesthesia. 2. Local anesthetic in a field block around all port sites.  SPECIMEN:  Mediastinal hernia sac (not sent).  DRAINS:   19-French Harrison Mons drain goes from the right upper quadrant of abdomen into the anterior diaphragmatic closure to the anterior mediastinum and right pleural cavity.    COUNTS:  YES  PLAN OF CARE: Admit to inpatient   PATIENT DISPOSITION:  ICU - intubated and hemodynamically stable.  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  Came in with pain and obstruction underwent laparoscopic reduction & primary suture repair by Dr. Ezzard Standing.  Patient had episode of chest pain recurrent abdominal pain with obstruction.  CAT scan revealed large volume of at least half of the colon and some small bowel up into a giant recurrent hernia involving her anterior mediastinum and most of her right chest and even left chest.  She had some improvement in symptoms but given the fact that she is incarcerated with a trapped bowel within this recommendation made for  reduction and repair with mesh reinforcement.  Minimally invasive versus open approaches discussed.    Risks such as bleeding, infection, abscess, leak, need for further treatment, heart attack, death, and other risks were discussed.   I noted a good likelihood this will help address the problem.  Goals of post-operative recovery were discussed as well.  Possibility that this will not correct all symptoms was explained.  Post-operative dysphagia, need for short-term liquid & pureed diet, inability to vomit, possibility of reherniation, possible need for medicines to help control symptoms in addition to surgery were discussed.  We will work to minimize complications.   Educational handouts further explaining the pathology, treatment options, and dysphagia diet was given as well.  Patient's insight was fair but patient's mother at bedside and agreed heartedly.  Again discussed with them.  They agreed to proceed.  OR FINDINGS:   Large anterior midline diaphragmatic hernia of Morgagni incarcerated with bowel from the distal ileum to the splenic flexure of the colon.  No evidence of any ischemia but suspicious for transition point / obstruction.  Giant hernia sac and right pleura excised to allow the contents to be reduced back in the abdomen.  There was a 8 x 8 cm dry from medic hernia defect.  Somewhat fibrotic.  Primary repair done with 0 Vlock absorbable serrated suture in a running fashion.  Tissue quality fair.  Given recurrence, reinforcement of mesh done with 25 x 20 cm Ventralight mesh - the upper half on the diaphragm and the lower half along the subcostal ridge and epigastric region.    Closure of the repair done with a 19 [place] Blake drain  with most of the drain going up in the mediastinum & especially the right chest.    Incidental noninflamed Meckel's diverticulum about 2 feet proximal to the ileocecal valve.  No evidence of any true malrotation but entire right colon and transverse colon  completely mobile.  Repositioned to more classic anatomy.  No colopexy done.  No Meckel's diverticulectomy nor appendectomy done given the need for mesh reinforcement repair.   Patient with episode of hypotension and end-tidal CO2 changes concerning.  Being left intubated.  Pulmonary critical care called.  DESCRIPTION:   Informed consent was confirmed.  The patient received IV antibiotics prior to incision.  The underwent general anesthesia without difficulty.  A Foley catheter sterilely placed.  The patient was positioned in split leg with arms tucked. The abdomen was prepped and draped in the sterile fashion.  Surgical time-out confirmed our plan.  I placed a 5 mm port in the right subcostal region using Varess entry technique with the patient in steep reverse Trendelenburg and left side up.  Initial aspiration did not reveal blood.  I repositioned and confirmed good water drop test.   We induced carbon dioxide insufflation.  There is no drop in end-tidal.  I placed a port in the left mid abdomen after complete insufflation cleanly.  Inspection revealed small needle puncture on the right anterior hepatic lobe.  Already had stopped bleeding.  The Xi robot was carefully docked and instruments placed and advanced under direct visualization.  Inspection revealed a large recurrent anterior diaphragmatic defect with mesentery and bowel going up into it.  I tried to do some reduction but it was quite fixed and incarcerated up in the mediastinum.  Given the recurrence I strongly suspected significant adhesions.  Therefore I focused on reducing the mediastinal hernia sac. We grasped the anterior mediastinal sac at the apex just underneath the xiphoid and sternum.  I scored through that and got into the anterior mediastinum.  I was able to free the mediastinal sac from its attachments to the sternum, anterior rib cage, pericardium and bilateral pleura using primarily focused gentle blunt dissection as well as  focused vessel sealer dissection.  This took some time.  The right pleura was densely involved and over 80% was filled with the abdominal contents.  Had to transect into the pleura to get it to safely reduce down.  There was involvement into the left pleura about 50%.  That peeled off more cleanly.  Eventually over time was able to get the giant hernia sac reduced and started to reduce the contents.  The distal ileum appendix and cecum and dilated proximal right colon came down.  So did a large twisted massively dilated transverse colon.  Eventually got that reduced off.  Freed the attachments of the mediastinal sac off of the diaphragm more proximally and cephalad.  Elevated and transected the giant mediastinal sac off of the colon and greater omentum especially.  I inspected the colon and saw no injury or other abnormality.  I was able to find the ligament of Treitz and inspecting normal appendix.  Inspected that colon from the cecum all the way over to the descending colon.  Found numerous adhesions causing some trapping's and terminal hernias.  These areas of omentum and hernia sac were trimmed off until everything was open and untwisted.  I ran the small bowel from the ileocecal valve more proximally.  Noted a small noninflamed Meckel's diverticulum.  Noted the ligament of Treitz was in the normal retroperitoneal position in  the left of the spine.  No definite incident of malrotation despite the whole proximal half of the colon completely mobile.  I position to the cecum and ascending colon to lay over the right side and the small bowel to fill up the middle.  No twisting or torsion or volvulus.  No evidence of any bowel injury or serosal injury.  We will try to keep the pressure low but she had a rather stiff abdomen is quite obese and thick with very dilated colon making visualization a challenge at times.  We just took care to be careful & methodical.  I turned attention back up to the diaphragm.  I  freed the liver off its attachments to the anterior & superior diaphragm all the way up to the hepatic vein.  That allowed me at least 10 cm from the anterior diaphragmatic hernia edge to the posterior diaphragm.   Around this time the patient's blood pressure dropped and end-tidal CO2 changed.  We desufflated.  They noted significant crepitus involving the chest face neck and even upper extremities.  End-tidal came down.  I rechecked blood pressure and it was fine.  Good pulses.  We are allowed to resume the case.  Robot redocked.  Try to keep the pressure at 10.  Did need to have the patient first a number to see given her rigid stiff abdomen, obesity, massively distended colon.  I transected the falciform ligament and preperitoneal fat off the anterior abdominal wall from the umbilicus cephalad to have more clear visualization of the posterior rectus fascia contiguous with the diaphragm.  Given the current defect in this obese woman with Prader-Willi and probable retching and constipation, I decided mesh reinforcement was needed.  I chose a 25 x 20 cm sheet of Ventralight polypropylene dual sided mesh.  I placed #1 Prolene sutures x2 near the center paramedian and then 6 along the inferior edge of the mesh interrupted for 8 sutures total.  I rolled the mesh and placed it through a 12 mm right lower quadrant portsite and unrolled it.  The mesh laid with the most cephalad edge on the superioposterior diaphragm.  I primarily closed the recurrent defect using 0 V-Loc suture in a running horizontal mattress situation.  Decreased pressure.  Can get it closed with tension, but the tissues are rather poor.  I placed a 78 [place] Blake drain from a separate right upper quadrant incision via a port and passed that up into the anterior mediastinum and to fill the right pleura,  leaving part of the cross edge of the drain to be in the abdomen as well to avoid any tension capnopneumothorax issues.  Closed the diaphragmatic  defect with that on the right lateral corner.  We then brought the mesh up using a laparoscopic suture passer with 2 central sutures and right and left subxiphoid.  2 stitches on the right and left lateral subcostal ridge along the anterior axillary line.  And then others running in any arc in the upper abdomen to good result.  Then used 0 V-Loc sutures to suture the mesh along the 10th rib along the mid axillary line and then up along the diaphragm towards the midline from each side.  Had a suture running the center part of the mesh along the diaphragm as well to help secure it and buttress the primary repair.  Took care to to get healthy bites of diaphragm without reaching into the pericardium.  At this point patient had a low blood  pressure reading which was concerning.  We again undocked and desufflated.  The cuff pressure remained low.  I brought in a more full anesthesia team.  We could feel carotid and radial pulses.  I did place the drain to bulb suction and we evacuated a large volume of carbon dioxide out of the mediastinum and right pleural cavity until it just had some scant thinly serosanguineous fluid.  Only drained about 50 mL from there.  No persistent active bleeding.  Cuff was switched over.  A-line was placed.  A-line confirmed a decent mean arterial pressure in the 70s to 80s.  Patient had no change in pulse pressures and CO2 embolism nor cardiac tamponade suspected.  Patient never became tachycardic nor bradycardic nor any dysrhythmias.  Patient was never hypoxic.  End-tidal CO2 which had been in the 50s to 60s came down into the 30s.  Patient needed Neo-Synephrine briefly but was quickly weaned off.  Reassuring.  This point I just went in laparoscopically with again low pressure flow insufflation.  And removed the hernia sacs and trimmed off the needle from the sutures.  Confirmed decent hemostasis.  The mesh laid well with at least 7 cm overlap from the diaphragmatic closure with half of  the mesh on the diaphragm and half on the abdominal wall.  Carbon oxide evacuated through the abdomen as well as through the [facility] drain mediastinal/chest tube.  Drain secured with Prolene suture.  The extraction site in R abdomen for the mediastinal hernia sac & where mesh was brought in being placed was closed using #1 PDS interrupted to good result.  Rest of the port sites were too small on my pinky to pass through side did not attempt fadcial closure.  Skin closed with 4 Monocryl.  Sterile dressings applied.  It was felt by anesthesia that she would benefit staying on the ventilator least overnight to allow her end-tidal to resolve and her lungs to reexpand.  I agreed.  Called and discussed with Dr. Jayme Cloud with pulmonary critical care.  They will follow the patient with the internal medicine service and ourselves.  I made 3 attempted phone calls to the patient's parents with no answer.  I updated my partner on-call, Dr. Royanne Foots about operative findings and need to keep the patient intubated with critical care consultation.  We will try and reach out to family again  Ardeth Sportsman, M.D., F.A.C.S. Gastrointestinal and Minimally Invasive Surgery [facility] [address] [place], [place] [address] [phone] Main / Paging

## 2022-03-08 NOTE — Procedures (Signed)
Arterial Catheter Insertion Procedure Note  EMAAN GARY  [id number]  1970/08/24  Date:02/25/2022  Time:10:39 PM    Provider Performing: Darcella Gasman Shuntavia Yerby    Procedure: Insertion of Arterial Line (06269) with US guidance (48546)   Indication(s) Blood pressure monitoring and/or need for frequent ABGs  Consent Unable to obtain consent due to emergent nature of procedure.  Anesthesia Lidocaine   Time Out Verified patient identification, verified procedure, site/side was marked, verified correct patient position, special equipment/implants available, medications/allergies/relevant history reviewed, required imaging and test results available.   Sterile Technique Maximal sterile technique including full sterile barrier drape, hand hygiene, sterile gown, sterile gloves, mask, hair covering, sterile ultrasound probe cover (if used).   Procedure Description Area of catheter insertion was cleaned with chlorhexidine and draped in sterile fashion. With real-time ultrasound guidance an arterial catheter was placed into the right  axillary  artery.  Appropriate arterial tracings confirmed on monitor.     Complications/Tolerance None; patient tolerated the procedure well.   EBL Minimal   Specimen(s) None   Darcella Gasman Sokha Craker, PA-C

## 2022-03-08 NOTE — Progress Notes (Signed)
Mobility Specialist Criteria Algorithm Info.   03/13/2022 1034  Mobility  Activity Ambulated with assistance in hallway  Range of Motion/Exercises Active;All extremities  Level of Assistance Standby assist, set-up cues, supervision of patient - no hands on  Assistive Device Four wheel walker  Distance Ambulated (ft) 500 ft  Activity Response Tolerated well   Patient received in supine, eagerly and pleasantly agreed to participate in mobility. Ambulated supervision level in hallway with steady gait. Returned to room without complaint or incident. Was left in supine with all needs met, call bell in reach.   02/25/2022 10:34 AM  [place] Corryn Madewell, [place], [place]  FPOIP:[phone] Office: [phone]

## 2022-03-08 NOTE — Anesthesia Procedure Notes (Addendum)
Procedure Name: Intubation Date/Time: 02/27/2022 2:07 PM  Performed by: Mayer Camel, CRNAPre-anesthesia Checklist: Patient identified, Emergency Drugs available, Suction available and Patient being monitored Patient Re-evaluated:Patient Re-evaluated prior to induction Oxygen Delivery Method: Circle System Utilized Preoxygenation: Pre-oxygenation with 100% oxygen Induction Type: IV induction Ventilation: Two handed mask ventilation required and Mask ventilation with difficulty Laryngoscope Size: Glidescope and 3 Grade View: Grade I Tube type: Oral Tube size: 7.0 mm Number of attempts: 1 Airway Equipment and Method: Stylet Placement Confirmation: ETT inserted through vocal cords under direct vision, positive ETCO2 and breath sounds checked- equal and bilateral Secured at: 21 cm Tube secured with: Tape Dental Injury: Teeth and Oropharynx as per pre-operative assessment  Difficulty Due To: Difficulty was anticipated, Difficult Airway- due to dentition and Difficult Airway- due to limited oral opening Comments: Performed by Francene Finders

## 2022-03-09 ENCOUNTER — Inpatient Hospital Stay (HOSPITAL_COMMUNITY): Payer: Medicare Other

## 2022-03-09 ENCOUNTER — Encounter (HOSPITAL_COMMUNITY): Payer: Self-pay | Admitting: Surgery

## 2022-03-09 DIAGNOSIS — E876 Hypokalemia: Secondary | ICD-10-CM | POA: Insufficient documentation

## 2022-03-09 DIAGNOSIS — Q790 Congenital diaphragmatic hernia: Secondary | ICD-10-CM | POA: Diagnosis not present

## 2022-03-09 DIAGNOSIS — I959 Hypotension, unspecified: Secondary | ICD-10-CM | POA: Diagnosis not present

## 2022-03-09 DIAGNOSIS — K440 Diaphragmatic hernia with obstruction, without gangrene: Secondary | ICD-10-CM | POA: Diagnosis not present

## 2022-03-09 LAB — BASIC METABOLIC PANEL
Anion gap: 10 (ref 5–15)
Anion gap: 7 (ref 5–15)
Anion gap: 2 — ABNORMAL LOW (ref 5–15)
BUN: 7 mg/dL (ref 6–20)
BUN: 5 mg/dL — ABNORMAL LOW (ref 6–20)
BUN: 7 mg/dL (ref 6–20)
CO2: 20 mmol/L — ABNORMAL LOW (ref 22–32)
CO2: 18 mmol/L — ABNORMAL LOW (ref 22–32)
CO2: 21 mmol/L — ABNORMAL LOW (ref 22–32)
Calcium: 8.4 mg/dL — ABNORMAL LOW (ref 8.9–10.3)
Calcium: 6.7 mg/dL — ABNORMAL LOW (ref 8.9–10.3)
Calcium: 8.1 mg/dL — ABNORMAL LOW (ref 8.9–10.3)
Chloride: 107 mmol/L (ref 98–111)
Chloride: 118 mmol/L — ABNORMAL HIGH (ref 98–111)
Chloride: 117 mmol/L — ABNORMAL HIGH (ref 98–111)
Creatinine, Ser: 0.72 mg/dL (ref 0.44–1.00)
Creatinine, Ser: 0.5 mg/dL (ref 0.44–1.00)
Creatinine, Ser: 0.67 mg/dL (ref 0.44–1.00)
GFR, Estimated: >60 mL/min (ref >60)
GFR, Estimated: >60 mL/min (ref >60)
GFR, Estimated: >60 mL/min (ref >60)
Glucose, Bld: 165 mg/dL — ABNORMAL HIGH (ref 70–99)
Glucose, Bld: 81 mg/dL (ref 70–99)
Glucose, Bld: 75 mg/dL (ref 70–99)
Potassium: 2.9 mmol/L — ABNORMAL LOW (ref 3.5–5.1)
Potassium: 3.5 mmol/L (ref 3.5–5.1)
Potassium: 4.1 mmol/L (ref 3.5–5.1)
Sodium: 137 mmol/L (ref 135–145)
Sodium: 143 mmol/L (ref 135–145)
Sodium: 140 mmol/L (ref 135–145)

## 2022-03-09 LAB — TRIGLYCERIDES: Triglycerides: 69 mg/dL (ref <150)

## 2022-03-09 LAB — POCT I-STAT 7, (LYTES, BLD GAS, ICA,H+H)
Acid-base deficit: 2 mmol/L (ref 0.0–2.0)
Acid-base deficit: 10 mmol/L — ABNORMAL HIGH (ref 0.0–2.0)
Acid-base deficit: 5 mmol/L — ABNORMAL HIGH (ref 0.0–2.0)
Bicarbonate: 23.1 mmol/L (ref 20.0–28.0)
Bicarbonate: 15.5 mmol/L — ABNORMAL LOW (ref 20.0–28.0)
Bicarbonate: 24.6 mmol/L (ref 20.0–28.0)
Calcium, Ion: 1.22 mmol/L (ref 1.15–1.40)
Calcium, Ion: 1.11 mmol/L — ABNORMAL LOW (ref 1.15–1.40)
Calcium, Ion: 1.54 mmol/L (ref 1.15–1.40)
HCT: 36 % (ref 36.0–46.0)
HCT: 37 % (ref 36.0–46.0)
HCT: 36 % (ref 36.0–46.0)
Hemoglobin: 12.2 g/dL (ref 12.0–15.0)
Hemoglobin: 12.6 g/dL (ref 12.0–15.0)
Hemoglobin: 12.2 g/dL (ref 12.0–15.0)
O2 Saturation: 100 %
O2 Saturation: 99 %
O2 Saturation: 98 %
Patient temperature: 97.6
Patient temperature: 97.7
Patient temperature: 98.7
Potassium: 2.9 mmol/L — ABNORMAL LOW (ref 3.5–5.1)
Potassium: 2.5 mmol/L — CL (ref 3.5–5.1)
Potassium: 5 mmol/L (ref 3.5–5.1)
Sodium: 136 mmol/L (ref 135–145)
Sodium: 142 mmol/L (ref 135–145)
Sodium: 138 mmol/L (ref 135–145)
TCO2: 24 mmol/L (ref 22–32)
TCO2: 16 mmol/L — ABNORMAL LOW (ref 22–32)
TCO2: 27 mmol/L (ref 22–32)
pCO2 arterial: 39.6 mmHg (ref 32–48)
pCO2 arterial: 30.3 mmHg — ABNORMAL LOW (ref 32–48)
pCO2 arterial: 71.6 mmHg (ref 32–48)
pH, Arterial: 7.372 (ref 7.35–7.45)
pH, Arterial: 7.315 — ABNORMAL LOW (ref 7.35–7.45)
pH, Arterial: 7.145 — CL (ref 7.35–7.45)
pO2, Arterial: 196 mmHg — ABNORMAL HIGH (ref 83–108)
pO2, Arterial: 142 mmHg — ABNORMAL HIGH (ref 83–108)
pO2, Arterial: 144 mmHg — ABNORMAL HIGH (ref 83–108)

## 2022-03-09 LAB — CBC
HCT: 37.5 % (ref 36.0–46.0)
HCT: 29.2 % — ABNORMAL LOW (ref 36.0–46.0)
Hemoglobin: 12.6 g/dL (ref 12.0–15.0)
Hemoglobin: 9.1 g/dL — ABNORMAL LOW (ref 12.0–15.0)
MCH: 31.3 pg (ref 26.0–34.0)
MCH: 31 pg (ref 26.0–34.0)
MCHC: 33.6 g/dL (ref 30.0–36.0)
MCHC: 31.2 g/dL (ref 30.0–36.0)
MCV: 93.3 fL (ref 80.0–100.0)
MCV: 99.3 fL (ref 80.0–100.0)
Platelets: 199 10*3/uL (ref 150–400)
Platelets: 175 10*3/uL (ref 150–400)
RBC: 4.02 MIL/uL (ref 3.87–5.11)
RBC: 2.94 MIL/uL — ABNORMAL LOW (ref 3.87–5.11)
RDW: 13.3 % (ref 11.5–15.5)
RDW: 13.5 % (ref 11.5–15.5)
WBC: 14.5 10*3/uL — ABNORMAL HIGH (ref 4.0–10.5)
WBC: 10.1 10*3/uL (ref 4.0–10.5)
nRBC: 0 % (ref 0.0–0.2)
nRBC: 0 % (ref 0.0–0.2)

## 2022-03-09 LAB — HEPATIC FUNCTION PANEL
ALT: 43 U/L (ref 0–44)
AST: 48 U/L — ABNORMAL HIGH (ref 15–41)
Albumin: 2.8 g/dL — ABNORMAL LOW (ref 3.5–5.0)
Alkaline Phosphatase: 54 U/L (ref 38–126)
Bilirubin, Direct: 0.2 mg/dL (ref 0.0–0.2)
Indirect Bilirubin: 0.5 mg/dL (ref 0.3–0.9)
Total Bilirubin: 0.7 mg/dL (ref 0.3–1.2)
Total Protein: 4.8 g/dL — ABNORMAL LOW (ref 6.5–8.1)

## 2022-03-09 LAB — GLUCOSE, CAPILLARY
Glucose-Capillary: 161 mg/dL — ABNORMAL HIGH (ref 70–99)
Glucose-Capillary: 104 mg/dL — ABNORMAL HIGH (ref 70–99)
Glucose-Capillary: 67 mg/dL — ABNORMAL LOW (ref 70–99)
Glucose-Capillary: 115 mg/dL — ABNORMAL HIGH (ref 70–99)
Glucose-Capillary: 103 mg/dL — ABNORMAL HIGH (ref 70–99)
Glucose-Capillary: 55 mg/dL — ABNORMAL LOW (ref 70–99)
Glucose-Capillary: 125 mg/dL — ABNORMAL HIGH (ref 70–99)
Glucose-Capillary: 136 mg/dL — ABNORMAL HIGH (ref 70–99)

## 2022-03-09 LAB — LACTIC ACID, PLASMA
Lactic Acid, Venous: 1.4 mmol/L (ref 0.5–1.9)
Lactic Acid, Venous: 1.9 mmol/L (ref 0.5–1.9)
Lactic Acid, Venous: 2.5 mmol/L (ref 0.5–1.9)

## 2022-03-09 LAB — MAGNESIUM: Magnesium: 1.4 mg/dL — ABNORMAL LOW (ref 1.7–2.4)

## 2022-03-09 LAB — HEMOGLOBIN A1C
Hgb A1c MFr Bld: 5.6 % (ref 4.8–5.6)
Mean Plasma Glucose: 114.02 mg/dL

## 2022-03-09 MED ORDER — FUROSEMIDE 10 MG/ML IJ SOLN: 40.0000 mg | Freq: Once | INTRAMUSCULAR | Status: DC

## 2022-03-09 MED ORDER — ORAL CARE MOUTH RINSE
15.0000 mL | OROMUCOSAL | Status: DC
Start: 2022-03-09 — End: 2022-03-09
  Administered 2022-03-09 (×5): 15 mL via OROMUCOSAL

## 2022-03-09 MED ORDER — DEXTROSE 50 % IV SOLN
12.5000 g | INTRAVENOUS | Status: AC
  Administered 2022-03-09: 12.5 g via INTRAVENOUS

## 2022-03-09 MED ORDER — DEXTROSE 50 % IV SOLN
12.5000 g | INTRAVENOUS | Status: AC
Start: 2022-03-09 — End: 2022-03-09
  Administered 2022-03-09: 12.5 g via INTRAVENOUS

## 2022-03-09 MED ORDER — MENTHOL 3 MG MT LOZG: 1.0000 | LOZENGE | OROMUCOSAL | Status: DC | PRN

## 2022-03-09 MED ORDER — MAGIC MOUTHWASH: 15.0000 mL | Freq: Four times a day (QID) | ORAL | Status: DC | PRN

## 2022-03-09 MED ORDER — LACTATED RINGERS IV BOLUS: 1000.0000 mL | Freq: Three times a day (TID) | INTRAVENOUS | Status: DC | PRN

## 2022-03-09 MED ORDER — FENTANYL CITRATE PF 50 MCG/ML IJ SOSY
PREFILLED_SYRINGE | INTRAMUSCULAR | Status: AC
  Filled 2022-03-09: qty 1

## 2022-03-09 MED ORDER — METHOCARBAMOL 1000 MG/10ML IJ SOLN
500.0000 mg | Freq: Four times a day (QID) | INTRAVENOUS | Status: DC | PRN
  Administered 2022-03-10: 500 mg via INTRAVENOUS
  Filled 2022-03-09: qty 5

## 2022-03-09 MED ORDER — DIPHENHYDRAMINE HCL 50 MG/ML IJ SOLN: 12.5000 mg | Freq: Four times a day (QID) | INTRAMUSCULAR | Status: DC | PRN

## 2022-03-09 MED ORDER — CALCIUM GLUCONATE 10 % IV SOLN
4.0000 g | Freq: Once | INTRAVENOUS | Status: AC
Start: 2022-03-09 — End: 2022-03-09
  Administered 2022-03-09: 4 g via INTRAVENOUS
  Filled 2022-03-09: qty 40

## 2022-03-09 MED ORDER — ORAL CARE MOUTH RINSE
15.0000 mL | OROMUCOSAL | Status: DC | PRN
  Administered 2022-03-09: 15 mL via OROMUCOSAL

## 2022-03-09 MED ORDER — POLYETHYLENE GLYCOL 3350 17 G PO PACK: 17.0000 g | PACK | Freq: Two times a day (BID) | ORAL | Status: DC

## 2022-03-09 MED ORDER — NALOXONE HCL 0.4 MG/ML IJ SOLN
INTRAMUSCULAR | Status: AC
  Filled 2022-03-09: qty 1

## 2022-03-09 MED ORDER — DEXTROSE 5 % IV SOLN: 1000.0000 mg | Freq: Four times a day (QID) | INTRAVENOUS | Status: DC | PRN

## 2022-03-09 MED ORDER — SODIUM CHLORIDE 0.9% FLUSH
10.0000 mL | Freq: Two times a day (BID) | INTRAVENOUS | Status: DC
  Administered 2022-03-09: 30 mL
  Administered 2022-03-10 – 2022-03-22 (×19): 10 mL
  Administered 2022-03-22: 30 mL
  Administered 2022-03-23 – 2022-03-24 (×3): 10 mL

## 2022-03-09 MED ORDER — SODIUM CHLORIDE 0.9 % IV SOLN: 8.0000 mg | Freq: Four times a day (QID) | INTRAVENOUS | Status: DC | PRN

## 2022-03-09 MED ORDER — HYDROMORPHONE HCL 1 MG/ML IJ SOLN
0.5000 mg | INTRAMUSCULAR | Status: DC | PRN
  Administered 2022-03-09: 1 mg via INTRAVENOUS
  Administered 2022-03-09: 0.5 mg via INTRAVENOUS
  Administered 2022-03-09: 1 mg via INTRAVENOUS
  Filled 2022-03-09 (×3): qty 1

## 2022-03-09 MED ORDER — PROCHLORPERAZINE EDISYLATE 10 MG/2ML IJ SOLN: 5.0000 mg | INTRAMUSCULAR | Status: DC | PRN

## 2022-03-09 MED ORDER — SODIUM CHLORIDE 0.9% FLUSH
10.0000 mL | INTRAVENOUS | Status: DC | PRN
  Administered 2022-03-13 – 2022-03-20 (×3): 10 mL

## 2022-03-09 MED ORDER — DEXTROSE 50 % IV SOLN
INTRAVENOUS | Status: AC
  Filled 2022-03-09: qty 50

## 2022-03-09 MED ORDER — MAGNESIUM SULFATE 4 GM/100ML IV SOLN
4.0000 g | Freq: Once | INTRAVENOUS | Status: AC
  Administered 2022-03-09: 4 g via INTRAVENOUS
  Filled 2022-03-09: qty 100

## 2022-03-09 MED ORDER — ACETAMINOPHEN 325 MG PO TABS: 650.0000 mg | ORAL_TABLET | Freq: Four times a day (QID) | ORAL | Status: DC | PRN

## 2022-03-09 MED ORDER — POTASSIUM CHLORIDE 10 MEQ/50ML IV SOLN
10.0000 meq | INTRAVENOUS | Status: AC
  Administered 2022-03-09 (×4): 10 meq via INTRAVENOUS
  Filled 2022-03-09 (×5): qty 50

## 2022-03-09 MED ORDER — PHENOL 1.4 % MT LIQD: 2.0000 | OROMUCOSAL | Status: DC | PRN

## 2022-03-09 MED ORDER — ACETAMINOPHEN 500 MG PO TABS
1000.0000 mg | ORAL_TABLET | Freq: Four times a day (QID) | ORAL | Status: DC
  Administered 2022-03-10: 1000 mg
  Filled 2022-03-09: qty 2

## 2022-03-09 MED ORDER — METHOCARBAMOL 500 MG PO TABS: 1000.0000 mg | ORAL_TABLET | Freq: Four times a day (QID) | ORAL | Status: DC | PRN

## 2022-03-09 MED ORDER — ALUM & MAG HYDROXIDE-SIMETH 200-200-20 MG/5ML PO SUSP: 30.0000 mL | Freq: Four times a day (QID) | ORAL | Status: DC | PRN

## 2022-03-09 MED ORDER — POTASSIUM CHLORIDE 10 MEQ/50ML IV SOLN
10.0000 meq | INTRAVENOUS | Status: AC
  Administered 2022-03-09 (×4): 10 meq via INTRAVENOUS
  Filled 2022-03-09 (×4): qty 50

## 2022-03-09 MED ORDER — ACETAMINOPHEN 500 MG PO TABS: 1000.0000 mg | ORAL_TABLET | Freq: Four times a day (QID) | ORAL | Status: DC

## 2022-03-09 MED ORDER — LACTATED RINGERS IV BOLUS
1000.0000 mL | Freq: Once | INTRAVENOUS | Status: AC
  Administered 2022-03-09: 1000 mL via INTRAVENOUS

## 2022-03-09 MED ORDER — OXYCODONE HCL 5 MG PO TABS
5.0000 mg | ORAL_TABLET | ORAL | Status: DC | PRN
  Administered 2022-03-10 – 2022-03-11 (×2): 10 mg
  Filled 2022-03-09 (×2): qty 2

## 2022-03-09 MED ORDER — NOREPINEPHRINE 4 MG/250ML-% IV SOLN
0.0000 ug/min | INTRAVENOUS | Status: DC
  Administered 2022-03-09: 2 ug/min via INTRAVENOUS
  Administered 2022-03-10: 8 ug/min via INTRAVENOUS
  Filled 2022-03-09: qty 250

## 2022-03-09 MED ORDER — METHOCARBAMOL 500 MG PO TABS: 500.0000 mg | ORAL_TABLET | Freq: Four times a day (QID) | ORAL | Status: DC | PRN

## 2022-03-09 MED ORDER — ACETAMINOPHEN 650 MG RE SUPP
650.0000 mg | Freq: Four times a day (QID) | RECTAL | Status: DC | PRN
  Filled 2022-03-09 (×2): qty 1

## 2022-03-09 MED ORDER — METHOCARBAMOL 1000 MG/10ML IJ SOLN: 1000.0000 mg | Freq: Four times a day (QID) | INTRAVENOUS | Status: DC | PRN

## 2022-03-09 MED ORDER — SODIUM CHLORIDE 0.9% FLUSH: 3.0000 mL | Freq: Two times a day (BID) | INTRAVENOUS | Status: DC

## 2022-03-09 MED ORDER — NOREPINEPHRINE 4 MG/250ML-% IV SOLN
INTRAVENOUS | Status: AC
  Filled 2022-03-09: qty 250

## 2022-03-09 MED ORDER — POTASSIUM CHLORIDE 20 MEQ PO PACK
40.0000 meq | PACK | Freq: Once | ORAL | Status: AC
Start: 2022-03-09 — End: 2022-03-09
  Administered 2022-03-09: 40 meq
  Filled 2022-03-09: qty 2

## 2022-03-09 MED ORDER — OXYCODONE HCL 5 MG PO TABS: 5.0000 mg | ORAL_TABLET | ORAL | Status: DC | PRN

## 2022-03-09 MED ORDER — HYDROMORPHONE HCL 1 MG/ML IJ SOLN: 0.2000 mg | Freq: Four times a day (QID) | INTRAMUSCULAR | Status: DC | PRN

## 2022-03-09 MED ORDER — ORAL CARE MOUTH RINSE: 15.0000 mL | OROMUCOSAL | Status: DC | PRN

## 2022-03-09 MED ORDER — BISACODYL 10 MG RE SUPP
10.0000 mg | Freq: Every day | RECTAL | Status: DC
  Administered 2022-03-09 – 2022-03-10 (×2): 10 mg via RECTAL
  Filled 2022-03-09 (×2): qty 1

## 2022-03-09 MED ORDER — DEXTROSE 10 % IV SOLN: INTRAVENOUS | Status: DC

## 2022-03-09 MED ORDER — SODIUM CHLORIDE 0.9% FLUSH: 3.0000 mL | INTRAVENOUS | Status: DC | PRN

## 2022-03-09 MED ORDER — SIMETHICONE 40 MG/0.6ML PO SUSP: 80.0000 mg | Freq: Four times a day (QID) | ORAL | Status: DC | PRN

## 2022-03-09 MED ORDER — POTASSIUM CHLORIDE 10 MEQ/50ML IV SOLN
10.0000 meq | INTRAVENOUS | Status: AC
  Administered 2022-03-09 (×2): 10 meq via INTRAVENOUS
  Filled 2022-03-09 (×2): qty 50

## 2022-03-09 MED ORDER — ONDANSETRON HCL 4 MG/2ML IJ SOLN: 4.0000 mg | Freq: Four times a day (QID) | INTRAMUSCULAR | Status: DC | PRN

## 2022-03-09 MED ORDER — SODIUM CHLORIDE 0.9 % IV SOLN: 250.0000 mL | INTRAVENOUS | Status: DC | PRN

## 2022-03-09 MED ORDER — NALOXONE HCL 0.4 MG/ML IJ SOLN
0.4000 mg | INTRAMUSCULAR | Status: DC | PRN
  Administered 2022-03-09: 0.4 mg via INTRAVENOUS

## 2022-03-09 MED ORDER — BISACODYL 10 MG RE SUPP: 10.0000 mg | Freq: Two times a day (BID) | RECTAL | Status: DC | PRN

## 2022-03-09 MED ORDER — LIP MEDEX EX OINT
TOPICAL_OINTMENT | Freq: Two times a day (BID) | CUTANEOUS | Status: DC
  Administered 2022-03-09: 75 via TOPICAL
  Administered 2022-03-09 – 2022-03-10 (×2): 1 via TOPICAL
  Administered 2022-03-10 – 2022-03-11 (×2): 75 via TOPICAL
  Administered 2022-03-11: 1 via TOPICAL
  Administered 2022-03-12: 75 via TOPICAL
  Administered 2022-03-16: 1 via TOPICAL
  Administered 2022-03-17 – 2022-03-19 (×3): 75 via TOPICAL
  Administered 2022-03-23: 1 via TOPICAL
  Administered 2022-03-23 – 2022-03-24 (×2): 75 via TOPICAL
  Filled 2022-03-09 (×2): qty 7

## 2022-03-09 MED ORDER — FENTANYL CITRATE PF 50 MCG/ML IJ SOSY
50.0000 ug | PREFILLED_SYRINGE | Freq: Once | INTRAMUSCULAR | Status: AC
  Administered 2022-03-09: 50 ug via INTRAVENOUS

## 2022-03-09 NOTE — Progress Notes (Signed)
BP via manual cuff = 72/48

## 2022-03-09 NOTE — Progress Notes (Signed)
Reached by recovery room.  Family contacted with new number.  I called that number.  I discussed operative findings, updated the patient's status, discussed probable steps to recovery, and gave postoperative recommendations to the patient's family.  Noted the need for keeping intubated for now and critical care consultation.  Expected prolonged stay.  Recommendations were made.  Questions were answered.  They expressed understanding & appreciation.  Ardeth Sportsman, MD, FACS, MASCRS Esophageal, Gastrointestinal & Colorectal Surgery Robotic and Minimally Invasive Surgery  [facility] [address] [place], [place] [address] [phone] Fax [phone] Main  CONTACT INFORMATION:  Weekday (9AM-5PM): Call [facility] main office at [phone]  Weeknight (5PM-9AM) or Weekend/Holiday: Check [email or web address] (password " [email or web address]") for General Surgery [facility] coverage  (Please, do not use SecureChat as it is not reliable communication to reach operating surgeons for immediate patient care)

## 2022-03-09 NOTE — Procedures (Signed)
Central Venous Catheter Insertion Procedure Note  Barbara Ward  [id number]  1971/01/23  Date:03/09/22  Time:6:37 AM   Provider Performing:Kayceon Oki R Tannar Broker   Procedure: Insertion of Non-tunneled Central Venous Catheter(36556) with US guidance (01601)   Indication(s) Medication administration  Consent Unable to obtain consent due to emergent nature of procedure.  Anesthesia Topical only with 1% lidocaine   Timeout Verified patient identification, verified procedure, site/side was marked, verified correct patient position, special equipment/implants available, medications/allergies/relevant history reviewed, required imaging and test results available.  Sterile Technique Maximal sterile technique including full sterile barrier drape, hand hygiene, sterile gown, sterile gloves, mask, hair covering, sterile ultrasound probe cover (if used).  Procedure Description Area of catheter insertion was cleaned with chlorhexidine and draped in sterile fashion.  With real-time ultrasound guidance a central venous catheter was placed into the left internal jugular vein. Nonpulsatile blood flow and easy flushing noted in all ports.  The catheter was sutured in place and sterile dressing applied.  Complications/Tolerance None; patient tolerated the procedure well. Chest X-ray is ordered to verify placement for internal jugular or subclavian cannulation.   Chest x-ray is not ordered for femoral cannulation.  EBL Minimal  Specimen(s) None   Barbara Ward [name], PA-C

## 2022-03-09 NOTE — Progress Notes (Signed)
Per pt request, family brought ipad, Consulting civil engineer, glasses and cards to bedside. This is also charted in flowsheets.

## 2022-03-09 NOTE — Progress Notes (Signed)
[facility] ADULT ICU REPLACEMENT PROTOCOL   The patient does apply for the [facility] Adult ICU Electrolyte Replacment Protocol based on the criteria listed below:   1.Exclusion criteria: TCTS patients, ECMO patients, and Dialysis patients 2. Is GFR >/= 30 ml/min? Yes.    Patient's GFR today is >60 3. Is SCr </= 2? Yes.   Patient's SCr is 0.72 mg/dL 4. Did SCr increase >/= 0.5 in 24 hours? No. 5.Pt's weight >40kg  Yes.   6. Abnormal electrolyte(s): K+ 2.9  7. Electrolytes replaced per protocol 8.  Call MD STAT for K+ </= 2.5, Phos </= 1, or Mag </= 1 Physician:  n/a  Melvern Banker 03/09/2022 4:00 AM

## 2022-03-09 NOTE — Progress Notes (Signed)
NAME:  DANAJAH BIRDSELL, MRN:  [id number], DOB:  Jun 16, 1971, LOS: 3 ADMISSION DATE:  03/15/2022, CONSULTATION DATE:  03/14/22 REFERRING MD:  EDP CHIEF COMPLAINT: Abdominal Pain    History of Present Illness:  Ruthene Methvin is a 51 y.o. F with PMH significant for Prader-Willi syndrome, HL, cognitive deficit, OSA, hypothyroidism, prior abdominal hernia repair 2019 who presented for abdominal pain on 7/15 and was found to have very large diaphramatic hernia with evidence of SBO.  She denied vomiting on admission and labs were largely WNL.   NG tube was placed and she was seen by surgery who recommended initial medical management; pt admitted Internal Medicine.  The decision to move forward with operative repair made as surgery felt risk of waiting outweighed benefit and she underwent operative repair 7/18.  She had an episode of hypotension and ET CO2 changes, therefore the decision made not to extubate immediately post-operatively and pt was transferred to ICU and PCCM consulted.  OP note mentions significant mediastinal adhesions with dense involvement of the R Pleura and 50% into the L pleura that required transection.  Pt had an episode of decreased end-tidal CO2 and crepitus of the chest, face and neck and large volume CO2 was evacuated.  No post-op pneumothorax or pneumomediastinum, JP drain in the R pleural space. Pertinent  Medical History   has a past medical history of Allergic rhinitis, Hyperlipidemia, Hypokalemia, MR (mental retardation), Prader-Willi syndrome, Sleep apnea, and Thyroid disease. Significant Hospital Events: Including procedures, antibiotic start and stop dates in addition to other pertinent events   07/15: Admitted to hospital for SBO in setting of diaphragmatic hernia 07/18: Repair of hernia, episode of hypotension and elevated end tidal CO2 so left intubated and transferred to ICU. On Neo for pressor support.   Interim History / Subjective:  Following commands. Denying any acute  complaints.  Review of Systems:   Negative unless stated in the subjective.  Objective   Blood pressure 102/79, pulse 70, temperature (!) 97.3 F (36.3 C), temperature source Axillary, resp. rate (!) 21, height 4\' 8"  (1.422 m), weight 79.4 kg, SpO2 100 %.    Vent Mode: PRVC FiO2 (%):  [30 %-65 %] 30 % Set Rate:  [20 bmp-28 bmp] 26 bmp Vt Set:  [300 mL-420 mL] 380 mL PEEP:  [5 cmH20] 5 cmH20 Plateau Pressure:  [15 cmH20-21 cmH20] 19 cmH20   Intake/Output Summary (Last 24 hours) at 03/09/2022 0831 Last data filed at 03/09/2022 0600 Gross per 24 hour  Intake 3158.82 ml  Output 1000 ml  Net 2158.82 ml   Filed Weights   02/22/2022 1101  Weight: 79.4 kg   Examination: General: NAD, sedated and intubated HENT: NCAT, intubated, pupils sluggish Lungs: CTAB Cardiovascular: NSR, good radial pulses. good cap refill. Abdomen: Soft, diminished bowel sounds, dressing present on abdomen with multiple incisions. Drain present on right upper quadrant.  Extremities: Contracture of bilateral feet, non-pitting edema.  Neuro: follows commands GU: foley present Consults  General Surgery Resolved Hospital Problem list    Assessment & Plan:  Post-op Ventilator Management Left intubated in the setting of transient hypotension and increase in End tidal CO2 after surgical intervention. Will plan to extubate today.  -Maintain full vent support with SAT/SBT -titrate Vent setting to maintain SpO2 greater than or equal to 90%. -HOB elevated 30 degrees. -Plateau pressures less than 30 cm H20.  -Follow chest x-ray, ABG prn.   -Bronchial hygiene and RT/bronchodilator protocol. -propofol and Dilaudid for pain and sedation  Large diaphragmatic hernia with SBO With adhesions to the mediastinum, L and R pleura s/p repair. Slight leukocytosis this am which may be due to pain. NG tube to suction with dark output.  -General Surgery following.  -Dilaudid for pain control -JP drain in place -repeat CXR in  this AM   Hypotension -Transient during surgery, required low dose Neo on presentation to the ICU. Likely due to sedation. Lactic down-trending. Pressor turned off this am.     Hypokalemia Hypomagnesemia K 2.9, Mag at 1.4. Will replete both this am. Repeat BMP at 1400.  -CTM and replete as needed.     Hyperglycemia: Goal 140-180. Currently under 180.  -Continue SSI.   Hypertension - Hold home ramipril.   Hyperlipidemia - Hold home atorvastatin.  Hypothyroidism - Hold home Synthroid.  Vitamin D deficiency - Hold home cholecalciferol.  Lymphedema - Hold home Lasix.   Anxiety Holding home meds due to n.p.o. status.   Best Practice (right click and "Reselect all SmartList Selections" daily)  Diet/type: NPO DVT prophylaxis: Lovenox GI prophylaxis: PPI Lines: Central line Foley:  Yes, and it is still needed Continuous: Propofol  Code Status:  full code Last date of multidisciplinary goals of care discussion [Plan for 07/19] Labs   CBC: Recent Labs  Lab 03/04/2022 1108 03/06/22 0450 03/07/22 0244 03-15-2022 0551 15-Mar-2022 1842 2022-03-15 2104 03/09/22 0232 03/09/22 0236 03/09/22 0520  WBC 8.5 7.2 6.3 7.5  --   --   --  14.5*  --   HGB 15.5* 13.4 13.6 13.4 13.3 14.6 12.2 12.6 12.6  HCT 46.6* 42.8 43.0 39.9 39.0 43.0 36.0 37.5 37.0  MCV 93.6 100.0 97.9 94.3  --   --   --  93.3  --   PLT 274 211 223 228  --   --   --  199  --    Basic Metabolic Panel: Recent Labs  Lab 03/15/2022 1108 03/06/22 0552 03/07/22 0244 03-15-22 0551 03/15/2022 1842 03/15/22 2104 03/09/22 0232 03/09/22 0236 03/09/22 0520  NA 142 139 144 138 133* 137 136 137 142  K 3.5 3.5 3.5 3.1* 4.4 3.6 2.9* 2.9* 2.5*  CL 102 103 109 106  --   --   --  107  --   CO2 25 26 24 26   --   --   --  20*  --   GLUCOSE 125* 100* 76 124*  --   --   --  165*  --   BUN 14 10 11  5*  --   --   --  7  --   CREATININE 0.91 0.91 0.82 0.73  --   --   --  0.72  --   CALCIUM 9.5 8.3* 8.2* 8.7*  --   --   --  8.4*  --    MG  --  2.1  --   --   --   --   --  1.4*  --   PHOS  --  2.7  --   --   --   --   --   --   --    GFR: Estimated Creatinine Clearance: 71.8 mL/min (by C-G formula based on SCr of 0.72 mg/dL). Recent Labs  Lab 03/06/22 0450 03/07/22 0244 03/15/22 0551 03/09/22 0014 03/09/22 0231 03/09/22 0236  WBC 7.2 6.3 7.5  --   --  14.5*  LATICACIDVEN  --   --   --  2.5* 1.9  --    Liver Function Tests: Recent  Labs  Lab 02/26/2022 1108 03/09/22 0236  AST 22 48*  ALT 16 43  ALKPHOS 78 54  BILITOT 1.1 0.7  PROT 7.0 4.8*  ALBUMIN 3.7 2.8*   Recent Labs  Lab 03/06/2022 1108  LIPASE 24   No results for input(s): "AMMONIA" in the last 168 hours. ABG    Component Value Date/Time   PHART 7.315 (L) 03/09/2022 0520   PCO2ART 30.3 (L) 03/09/2022 0520   PO2ART 142 (H) 03/09/2022 0520   HCO3 15.5 (L) 03/09/2022 0520   TCO2 16 (L) 03/09/2022 0520   ACIDBASEDEF 10.0 (H) 03/09/2022 0520   O2SAT 99 03/09/2022 0520    Coagulation Profile: No results for input(s): "INR", "PROTIME" in the last 168 hours. Cardiac Enzymes: No results for input(s): "CKTOTAL", "CKMB", "CKMBINDEX", "TROPONINI" in the last 168 hours. HbA1C: Hgb A1c MFr Bld  Date/Time Value Ref Range Status  03/09/2022 02:36 AM 5.6 4.8 - 5.6 % Final    Comment:    (NOTE) Pre diabetes:          5.7%-6.4%  Diabetes:              >6.4%  Glycemic control for   <7.0% adults with diabetes    CBG: Recent Labs  Lab 03-22-2022 2027 03-22-2022 2306 03/09/22 0305 03/09/22 0731  GLUCAP 209* 213* 161* 104*   Past Medical History:  She,  has a past medical history of Allergic rhinitis, Hyperlipidemia, Hypokalemia, MR (mental retardation), Prader-Willi syndrome, Sleep apnea, and Thyroid disease.  Surgical History:   Past Surgical History:  Procedure Laterality Date   ESOPHAGOGASTRODUODENOSCOPY (EGD) WITH PROPOFOL N/A 02/25/2018   Procedure: ESOPHAGOGASTRODUODENOSCOPY (EGD) WITH PROPOFOL;  Surgeon: Kathi Der, MD;  Location: [facility]  ENDOSCOPY;  Service: Gastroenterology;  Laterality: N/A;   HERNIA REPAIR     HIATAL HERNIA REPAIR N/A 02/27/2018   Procedure: LAPAROSCOPIC REPAIR OF HIATAL HERNIA;  Surgeon: Ovidio Kin, MD;  Location: [facility] OR;  Service: General;  Laterality: N/A;   LAPAROSCOPIC GASTROSTOMY N/A 02/27/2018   Procedure: LAPAROSCOPIC GASTROSTOMY;  Surgeon: Ovidio Kin, MD;  Location: [facility] OR;  Service: General;  Laterality: N/A;   THYROIDECTOMY      Social History:   reports that she has never smoked. She has never used smokeless tobacco. She reports that she does not drink alcohol and does not use drugs.  Family History:  Her family history is not on file.  Allergies Allergies  Allergen Reactions   Codeine Other (See Comments)    Unknown reaction   Penicillins Other (See Comments)    Unknown reaction   Tape Other (See Comments)    "surgical tape" - unknown reaction    Home Medications  Prior to Admission medications   Medication Sig Start Date End Date Taking? Authorizing Provider  albuterol (VENTOLIN HFA) 108 (90 Base) MCG/ACT inhaler Inhale 2 puffs into the lungs every 6 (six) hours as needed for wheezing or shortness of breath.   Yes [provider]  ALPRAZolam (XANAX) 0.25 MG tablet Take 0.25 mg by mouth every 12 (twelve) hours.   Yes [provider]  atorvastatin (LIPITOR) 40 MG tablet Take 1 tablet (40 mg total) by mouth daily at 6 PM. 03/06/18  Yes Riccio, Angela C, DO  busPIRone (BUSPAR) 15 MG tablet Take 15 mg by mouth 2 (two) times daily.   Yes [provider]  busPIRone (BUSPAR) 15 MG tablet Take 15 mg by mouth 3 (three) times daily.   Yes [provider]  Cholecalciferol (VITAMIN D3) 50 MCG (  2000 UT) capsule Take 2,000 Units by mouth daily.   Yes [provider]  citalopram (CELEXA) 40 MG tablet Take 40 mg by mouth daily.   Yes [provider]  docusate sodium (COLACE) 100 MG capsule Take 100 mg by mouth every evening.   Yes [provider]  fluticasone (FLONASE) 50 MCG/ACT nasal spray Place 1 spray into both nostrils every evening.    Yes [provider]  furosemide (LASIX) 40 MG tablet Take 40 mg by mouth daily.   Yes [provider]  glucosamine-chondroitin 500-400 MG tablet Take 1 tablet by mouth every morning.   Yes [provider]  levothyroxine (SYNTHROID, LEVOTHROID) 137 MCG tablet Take 137 mcg by mouth daily before breakfast.   Yes [provider]  loratadine (CLARITIN) 10 MG tablet Take 10 mg by mouth every morning.   Yes [provider]  Multiple Vitamin (MULTIVITAMIN WITH MINERALS) TABS tablet Take 1 tablet by mouth every morning.   Yes [provider]  potassium chloride (K-DUR) 10 MEQ tablet Take 10 mEq by mouth every morning.   Yes [provider]  PSYLLIUM FIBER PO Take 1 tablet by mouth in the morning and at bedtime.   Yes [provider]  ramipril (ALTACE) 10 MG capsule Take 10 mg by mouth daily.   Yes [provider]  Zinc Oxide (DESITIN RAPID RELIEF) 13 % CREA Apply 1 Application topically daily as needed (Apply to buttock and genital area after using restroom).   Yes [provider]    Gwenevere Abbot, MD Eligha Bridegroom. [facility] Internal Medicine Residency, PGY-2

## 2022-03-09 NOTE — Progress Notes (Signed)
eLink Physician-Brief Progress Note Patient Name: Barbara Ward DOB: [birth year] MRN: [id number]   Date of Service  03/09/2022  HPI/Events of Note  Low BP , check manual Low UO, +9L Low CBG x 2  eICU Interventions  Titrate levo to get BP up Hold off lasix D10 @ 30/h, dc SSI     Intervention Category Major Interventions: Hypotension - evaluation and management Intermediate Interventions: Oliguria - evaluation and management  Nickson Middlesworth V. Verena Shawgo 03/09/2022, 8:51 PM

## 2022-03-09 NOTE — Anesthesia Postprocedure Evaluation (Signed)
Anesthesia Post Note  Patient: Barbara Ward  Procedure(s) Performed: XI ROBOTIC ASSISTED RECURRENT DIAPHRAGMATIC HERNIA REPAIR (Abdomen)     Patient location during evaluation: SICU Anesthesia Type: General Level of consciousness: sedated Pain management: pain level controlled Vital Signs Assessment: post-procedure vital signs reviewed and stable Respiratory status: patient remains intubated per anesthesia plan Cardiovascular status: stable Postop Assessment: no apparent nausea or vomiting Anesthetic complications: yes Comments: Patient had 2 hypotensive episodes intra-operatively. During case patient had ETCO2 readings in the 60's for most of the case. I came into room and noticed massive crepitus from chest wall to patients masseter muscle in her face.  After the second hypotensive episode patient was placed flat,  abdomen decompressed and an a line was placed. A line read 100's over 50's. Decision was made to finish case with laparoscopes and not robotically. Patient tolerated this without rise in ETCO2. Due to the massive amount of crepitus and with the patient being a difficult intubation decision was made to go to PACU intubated. Patient was stable Hemodynamically in PACU and transferred to ICU later.   Encounter Notable Events  Notable Event Outcome Phase Comment  Difficult to intubate - expected  Intraprocedure Filed from anesthesia note documentation.    Last Vitals:  Vitals:   03/09/22 0731 03/09/22 0732  BP:    Pulse: 72 72  Resp: (!) 26 (!) 26  Temp:    SpO2: 100% 100%    Last Pain:  Vitals:   03/09/22 0729  TempSrc: Axillary  PainSc:                  Rheba Diamond S

## 2022-03-09 NOTE — Progress Notes (Signed)
Hypoglycemic Event  CBG: 55  Treatment: D50 25 mL (12.5 gm)  Symptoms: None  Follow-up CBG: Time:1945 CBG Result:125  Possible Reasons for Event: Inadequate meal intake  Comments/MD notified:see orders    Barbara Ward Arsenio Schnorr

## 2022-03-09 NOTE — Progress Notes (Addendum)
MD notified of pt's drowsiness, low BP, and tachycardia, and decreased urine output. Narcan 0.4 mg given. Pt is now more alert. BP are still low. Waiting to get manual BP cuff reading before starting levophed.

## 2022-03-09 NOTE — Progress Notes (Addendum)
Interim Note  Extubated by had myriad of issues through day.  (1) 6PM More somnolent ABG with retention Responded to narcan Will drop dilaudid dose Can use narcan PRN  (2)  UOP low all day, unresponsive to fluid.  CVP 9.  Repeat Cr okay.  Just monitor.  (3) Now Bps are low, cuff accuracy questionable.  Lactate normal.  Will try to find manual cuff.   May have to restart levo but would prefer to avoid if we can.  Keep 1 more day in ICU  Additional 60 mins cc time spent through day. Erskine Emery MD

## 2022-03-09 NOTE — Progress Notes (Signed)
ABG results given to MD.  

## 2022-03-09 NOTE — Procedures (Signed)
Extubation Procedure Note  Patient Details:   Name: Barbara Ward DOB: 1970-09-13 MRN: [id number]   Airway Documentation:    Vent end date: 03/09/22 Vent end time: 1136   Evaluation  O2 sats: stable throughout Complications: No apparent complications Patient did tolerate procedure well. Bilateral Breath Sounds: Clear, Diminished   Pt extubated to 2L [place] per MD order. Pt had positive cuff leak prior to extubation. Pt able to voice her name. No stridor noted.  Guss Bunde 03/09/2022, 11:36 AM

## 2022-03-09 NOTE — Progress Notes (Addendum)
Barbara Ward [id number] [birth year]  CARE TEAM:  PCP: Hurshel Party, NP  Outpatient Care Team: Patient Care Team: Hurshel Party, NP as PCP - General (Internal Medicine) Hurshel Party, NP as Nurse Practitioner (Internal Medicine) Kathi Der, MD as Consulting Physician (Gastroenterology) Ovidio Kin, MD as Consulting Physician (General Surgery)  Inpatient Treatment Team: Treatment Team: Attending Provider: Lorin Glass, MD; Rounding Team: (Rounding), Imts Lorelle Gibbs, MD; Consulting Physician: Montez Morita, Md, MD; Consulting Physician: Karie Soda, MD; Mobility Specialist: Tripp, [place] T; Consulting Physician: Migdalia Dk, MD; Consulting Physician: Charlotte Sanes, MD; Consulting Physician: Pccm, Md, MD; Registered Nurse: Elie Goody, RN; Registered Nurse: Clide Cliff, RN   Problem List:   Principal Problem:   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  No evidence of any ischemia but suspicious for transition point there.  Giant hernia sac and right pleura excised to allow the contents to be reduced back in the abdomen.  There was a 8 x 8 cm dry from medic hernia defect.  Somewhat fibrotic.  Primary repair done with 0 of Eli serrated suture in a running fashion.  Tissues fair and given recurrence reinforcement of mesh done with 25 x 20 cm ventral light mesh with the upper half on the diaphragm and the lower half along  the subcostal ridge and epigastric region.     Closure of the repair done with a 19 [place] Blake drain with most of the drain going up in the mediastinum especially the right chest.     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  No evidence of any true malrotation but entire right colon and transverse colon completely mobile.  Repositioned to more classic anatomy.  No colopexy done.  No Meckel's diverticulectomy nor appendectomy done given the need for mesh reinforcement repair.    Patient with episode of hypotension and end-tidal CO2 changes concerning.  Being left intubated.  Pulmonary critical care called.  Assessment  Guarded but stabilizing  [facility] Stay = 3 days)  Plan:  -Critical care support appreciated.  -good lung insufflation after reduction of giant mediastinal/pleural hernia sac  Vent settings.  Suspect she would benefit from diuresis prior to extubation but most likely need to hold off given her severe hypokalemia.  Defer to critical care.  Keep Harrison Mons drain which is up in the mediastinum and right pleural cavity.  Probably keep for about 10 days and then remove once drainage is down and she is extubated and improving.  Aggressive potassium replacement.  With hypokalemia.  In process.  Hypomagnesemia as well.  Expect that his contributing.  Replace.  NG tube until has ileus resolved.  She is at high risk for that given her obstructive symptoms coming and massively distended colon.  Refused awake placement,  Dulcolax suppository daily until ileus resolved.  Ulcers evidence of resolution of ileus with flatus and bowel movement, clamping trial with liquids.  If tolerates for  24 hours, remove NG tube and advance diet gradually.  Probably will need an aggressive fiber bowel regimen -I believe she is imminently on Senokot and MiraLAX at home.  CCS will continue consult.  Dr. Corliss Skains on this week.  Please see AMION for  further follow-up.   Disposition: TBD Disposition:     I reviewed nursing notes, Consultant critical care notes, last 24 h vitals and pain scores, last 48 h intake and output, last 24 h labs and trends, and last 24 h imaging results. I have reviewed this patient's available data, including medical history, events of note, test results, etc as part of my evaluation.  A significant portion of that time was spent in counseling.  Care during the described time interval was provided by me.  This care required high  level of medical decision making.  03/09/2022    Subjective: (Chief complaint)  Events noted  Patient intubated on low Neo-Synephrine.  Response to voice.  Critical nursing just outside room  Objective:  Vital signs:  Vitals:   03/17/2022 2347 03/09/22 0045 03/09/22 0328 03/09/22 0409  BP:      Pulse: 63 73  77  Resp: (!) 29 (!) 26  (!) 26  Temp:   97.7 F (36.5 C)   TempSrc:   Oral   SpO2: 100% 100%  100%  Weight:      Height:    4\' 8"  (1.422 m)    Last BM Date : 03/07/22  Intake/Output   Yesterday:  07/18 0701 - 07/19 0700 In: 3158.8 [I.V.:2626.7; NG/GT:120; IV Piggyback:412.1] Out: 845 [Urine:575; Emesis/NG output:50; Drains:170; Blood:50] This shift:  No intake/output data recorded.  Bowel function:  Flatus: No  BM:  No  Drain: Serosanguinous   Physical Exam:  General: Pt intubated resting in no acute distress.  Proponents to voice and light touch but somewhat confused  acute distress Eyes: PERRL, Sclera clear.  No icterus Neuro: CN II-XII intact w/o focal sensory/motor deficits. Lymph: No head/neck/groin lymphadenopathy Psych:  No delerium/psychosis/paranoia. HENT: Normocephalic, Mucus membranes moist.  No thrush Neck: Supple, No tracheal deviation.  No obvious thyromegaly Chest: No pain to chest wall compression.  Good respiratory excursion.  No audible wheezing CV:  Pulses intact.  Regular rhythm.  No major extremity edema MS: Normal  AROM mjr joints.  No obvious deformity  Abdomen: Somewhat firm.  Moderately distended.   Mild tenderness at incisions and drain site no diffuse peritonitis .  No evidence of peritonitis.  No incarcerated hernias.  Ext:  No deformity.  Some swelling edema/anasarca.  1--2+.   No cyanosis Skin: No petechiae / purpurea.  No major sores.  Warm and dry    Results:   Cultures: Recent Results (from the past 720 hour(s))  MRSA Next Gen by PCR, Nasal     Status: None   Collection Time: 03/06/22  7:36 PM   Specimen: Nasal Mucosa; Nasal Swab  Result Value Ref Range Status   MRSA by PCR Next Gen NOT DETECTED NOT DETECTED Final    Comment: (NOTE) The GeneXpert MRSA Assay (FDA approved for NASAL specimens only), is one component of a comprehensive MRSA colonization surveillance program. It is not intended to diagnose MRSA infection nor to guide or monitor treatment for MRSA infections. Test performance is not FDA approved in patients less than 58 years old. Performed at [facility], [address] [place], [place]     Labs: Results for orders placed or performed during the hospital encounter  of 2022-03-12 (from the past 48 hour(s))  CBC     Status: None   Collection Time: 02/19/2022  5:51 AM  Result Value Ref Range   WBC 7.5 4.0 - 10.5 K/uL   RBC 4.23 3.87 - 5.11 MIL/uL   Hemoglobin 13.4 12.0 - 15.0 g/dL   HCT 67.6 19.5 - 09.3 %   MCV 94.3 80.0 - 100.0 fL   MCH 31.7 26.0 - 34.0 pg   MCHC 33.6 30.0 - 36.0 g/dL   RDW 26.7 12.4 - 58.0 %   Platelets 228 150 - 400 K/uL   nRBC 0.0 0.0 - 0.2 %    Comment: Performed at [facility], [address], [place], [place] [address]  Basic metabolic panel     Status: Abnormal   Collection Time: 03/04/2022  5:51 AM  Result Value Ref Range   Sodium 138 135 - 145 mmol/L   Potassium 3.1 (L) 3.5 - 5.1 mmol/L   Chloride 106 98 - 111 mmol/L   CO2 26 22 - 32 mmol/L   Glucose, Bld 124 (H) 70 - 99 mg/dL    Comment: Glucose reference  range applies only to samples taken after fasting for at least 8 hours.   BUN 5 (L) 6 - 20 mg/dL   Creatinine, Ser 8.25 0.44 - 1.00 mg/dL   Calcium 8.7 (L) 8.9 - 10.3 mg/dL   GFR, Estimated >05 >39 mL/min    Comment: (NOTE) Calculated using the CKD-EPI Creatinine Equation (2021)    Anion gap 6 5 - 15    Comment: Performed at [facility], [address], [place], [place] [address]  ABO/Rh     Status: None   Collection Time: 03/07/2022  5:51 AM  Result Value Ref Range   ABO/RH(D)      O POS Performed at [facility], [address] [place], [place] [address]   Type and screen [facility]     Status: None   Collection Time: 03/16/2022  1:19 PM  Result Value Ref Range   ABO/RH(D) O POS    Antibody Screen NEG    Sample Expiration      03/11/2022,2359 Performed at [facility], [address], [place], [place] [address]   I-STAT 7, (LYTES, BLD GAS, ICA, H+H)     Status: Abnormal   Collection Time: 03/07/2022  6:42 PM  Result Value Ref Range   pH, Arterial 7.244 (L) 7.35 - 7.45   pCO2 arterial 50.1 (H) 32 - 48 mmHg   pO2, Arterial 323 (H) 83 - 108 mmHg   Bicarbonate 21.9 20.0 - 28.0 mmol/L   TCO2 23 22 - 32 mmol/L   O2 Saturation 100 %   Acid-base deficit 6.0 (H) 0.0 - 2.0 mmol/L   Sodium 133 (L) 135 - 145 mmol/L   Potassium 4.4 3.5 - 5.1 mmol/L   Calcium, Ion 1.18 1.15 - 1.40 mmol/L   HCT 39.0 36.0 - 46.0 %   Hemoglobin 13.3 12.0 - 15.0 g/dL   Patient temperature 73.5 C    Collection site RADIAL, ALLEN'S TEST ACCEPTABLE    Drawn by Nurse    Sample type ARTERIAL   Glucose, capillary     Status: Abnormal   Collection Time: 03/16/2022  8:27 PM  Result Value Ref Range   Glucose-Capillary 209 (H) 70 - 99 mg/dL    Comment: Glucose reference range applies only to samples taken after fasting for at least 8 hours.  I-STAT 7, (LYTES, BLD GAS, ICA, H+H)     Status: Abnormal   Collection Time: 2022/03/13  9:04 PM  Result Value Ref Range   pH, Arterial  7.146 (LL) 7.35 - 7.45   pCO2 arterial 61.5 (H) 32 - 48 mmHg   pO2, Arterial 152 (H) 83 - 108 mmHg   Bicarbonate 21.4 20.0 - 28.0 mmol/L   TCO2 23 22 - 32 mmol/L   O2 Saturation 99 %   Acid-base deficit 9.0 (H) 0.0 - 2.0 mmol/L   Sodium 137 135 - 145 mmol/L   Potassium 3.6 3.5 - 5.1 mmol/L   Calcium, Ion 1.24 1.15 - 1.40 mmol/L   HCT 43.0 36.0 - 46.0 %   Hemoglobin 14.6 12.0 - 15.0 g/dL   Patient temperature 44.0 F    Collection site RADIAL, ALLEN'S TEST ACCEPTABLE    Drawn by RT    Sample type ARTERIAL    Comment NOTIFIED PHYSICIAN   Glucose, capillary     Status: Abnormal   Collection Time: 03-13-22 11:06 PM  Result Value Ref Range   Glucose-Capillary 213 (H) 70 - 99 mg/dL    Comment: Glucose reference range applies only to samples taken after fasting for at least 8 hours.  Lactic acid, plasma     Status: Abnormal   Collection Time: 03/09/22 12:14 AM  Result Value Ref Range   Lactic Acid, Venous 2.5 (HH) 0.5 - 1.9 mmol/L    Comment: CRITICAL RESULT CALLED TO, READ BACK BY AND VERIFIED WITH Brent General, RN, [phone] 03/09/22, Mliss Sax Performed at [facility], [address], [place], [place] [address]   Lactic acid, plasma     Status: None   Collection Time: 03/09/22  2:31 AM  Result Value Ref Range   Lactic Acid, Venous 1.9 0.5 - 1.9 mmol/L    Comment: Performed at [facility], [address] [place], [place] [address]  I-STAT 7, (LYTES, BLD GAS, ICA, H+H)     Status: Abnormal   Collection Time: 03/09/22  2:32 AM  Result Value Ref Range   pH, Arterial 7.372 7.35 - 7.45   pCO2 arterial 39.6 32 - 48 mmHg   pO2, Arterial 196 (H) 83 - 108 mmHg   Bicarbonate 23.1 20.0 - 28.0 mmol/L   TCO2 24 22 - 32 mmol/L   O2 Saturation 100 %   Acid-base deficit 2.0 0.0 - 2.0 mmol/L   Sodium 136 135 - 145 mmol/L   Potassium 2.9 (L) 3.5 - 5.1 mmol/L   Calcium, Ion 1.22 1.15 - 1.40 mmol/L   HCT 36.0 36.0 - 46.0 %   Hemoglobin 12.2 12.0 - 15.0 g/dL   Patient temperature 43.3 F     Collection site RADIAL, ALLEN'S TEST ACCEPTABLE    Drawn by RT    Sample type ARTERIAL   CBC     Status: Abnormal   Collection Time: 03/09/22  2:36 AM  Result Value Ref Range   WBC 14.5 (H) 4.0 - 10.5 K/uL   RBC 4.02 3.87 - 5.11 MIL/uL   Hemoglobin 12.6 12.0 - 15.0 g/dL   HCT 29.5 18.8 - 41.6 %   MCV 93.3 80.0 - 100.0 fL   MCH 31.3 26.0 - 34.0 pg   MCHC 33.6 30.0 - 36.0 g/dL   RDW 60.6 30.1 - 60.1 %   Platelets 199 150 - 400 K/uL   nRBC 0.0 0.0 - 0.2 %    Comment: Performed at [facility], [address], [place], [place] [address]  Basic metabolic panel     Status: Abnormal   Collection Time: 03/09/22  2:36 AM  Result Value Ref Range   Sodium 137 135 - 145 mmol/L   Potassium 2.9 (L) 3.5 - 5.1 mmol/L   Chloride 107 98 - 111 mmol/L   CO2 20 (L) 22 - 32 mmol/L   Glucose, Bld 165 (H) 70 - 99 mg/dL    Comment: Glucose reference range applies only to samples taken after fasting for at least 8 hours.   BUN 7 6 - 20 mg/dL   Creatinine, Ser 2.13 0.44 - 1.00 mg/dL   Calcium 8.4 (L) 8.9 - 10.3 mg/dL   GFR, Estimated >08 >65 mL/min    Comment: (NOTE) Calculated using the CKD-EPI Creatinine Equation (2021)    Anion gap 10 5 - 15    Comment: Performed at [facility], [address], [place], [place] [address]  Magnesium     Status: Abnormal   Collection Time: 03/09/22  2:36 AM  Result Value Ref Range   Magnesium 1.4 (L) 1.7 - 2.4 mg/dL    Comment: Performed at [facility], [address] [place], [place] [address]  Hemoglobin A1c     Status: None   Collection Time: 03/09/22  2:36 AM  Result Value Ref Range   Hgb A1c MFr Bld 5.6 4.8 - 5.6 %    Comment: (NOTE) Pre diabetes:          5.7%-6.4%  Diabetes:              >6.4%  Glycemic control for   <7.0% adults with diabetes    Mean Plasma Glucose 114.02 mg/dL    Comment: Performed at [facility], [address], [place], [place] [address]  Triglycerides     Status: None   Collection Time:  03/09/22  2:36 AM  Result Value Ref Range   Triglycerides 69 <150 mg/dL    Comment: Performed at [facility], [address], [place], [place] [address]  Hepatic function panel     Status: Abnormal   Collection Time: 03/09/22  2:36 AM  Result Value Ref Range   Total Protein 4.8 (L) 6.5 - 8.1 g/dL   Albumin 2.8 (L) 3.5 - 5.0 g/dL   AST 48 (H) 15 - 41 U/L   ALT 43 0 - 44 U/L   Alkaline Phosphatase 54 38 - 126 U/L   Total Bilirubin 0.7 0.3 - 1.2 mg/dL   Bilirubin, Direct 0.2 0.0 - 0.2 mg/dL   Indirect Bilirubin 0.5 0.3 - 0.9 mg/dL    Comment: Performed at [facility], [address], [place], [place] [address]  Glucose, capillary     Status: Abnormal   Collection Time: 03/09/22  3:05 AM  Result Value Ref Range   Glucose-Capillary 161 (H) 70 - 99 mg/dL    Comment: Glucose reference range applies only to samples taken after fasting for at least 8 hours.  I-STAT 7, (LYTES, BLD GAS, ICA, H+H)     Status: Abnormal   Collection Time: 03/09/22  5:20 AM  Result Value Ref Range   pH, Arterial 7.315 (L) 7.35 - 7.45   pCO2 arterial 30.3 (L) 32 - 48 mmHg   pO2, Arterial 142 (H) 83 - 108 mmHg   Bicarbonate 15.5 (L) 20.0 - 28.0 mmol/L   TCO2 16 (L) 22 - 32 mmol/L   O2 Saturation 99 %   Acid-base  deficit 10.0 (H) 0.0 - 2.0 mmol/L   Sodium 142 135 - 145 mmol/L   Potassium 2.5 (LL) 3.5 - 5.1 mmol/L   Calcium, Ion 1.11 (L) 1.15 - 1.40 mmol/L   HCT 37.0 36.0 - 46.0 %   Hemoglobin 12.6 12.0 - 15.0 g/dL   Patient temperature 95.297.7 F    Collection site RADIAL, ALLEN'S TEST ACCEPTABLE    Drawn by RT    Sample type ARTERIAL    Comment NOTIFIED PHYSICIAN     Imaging / Studies: DG CHEST PORT 1 VIEW  Result Date: 03/09/2022 CLINICAL DATA:  Central line placement. EXAM: PORTABLE CHEST 1 VIEW COMPARISON:  2022-05-26. FINDINGS: The heart size and mediastinal contours are stable. Patchy airspace disease is noted at the lung bases bilaterally. A right-sided chest tube is stable position. No  effusion or pneumothorax. Extensive subcutaneous emphysema is slightly improved. An enteric tube terminates in the stomach. A left internal jugular central venous catheter terminates over the superior vena cava. An endotracheal tube terminates 3.9 cm above the carina. IMPRESSION: 1. Interval placement of left internal jugular central venous catheter which terminates over the superior vena cava. 2. Right-sided chest tube in place.  No pneumothorax. 3. Patchy airspace disease at the lung bases, unchanged. 4. Diffuse subcutaneous emphysema, improved from the prior exam. Electronically Signed   By: Thornell SartoriusLaura  Taylor M.D.   On: 03/09/2022 02:13   DG Abd Portable 1V  Result Date: 2021/09/01 CLINICAL DATA:  Nasogastric tube placement EXAM: PORTABLE ABDOMEN - 1 VIEW COMPARISON:  None Available. FINDINGS: Nasogastric tube extends into the left upper quadrant the abdomen looped within the gastric fundus. Normal abdominal gas pattern. Extensive subcutaneous gas within the abdominal wall. IMPRESSION: Nasogastric tube looped within the gastric fundus. Extensive subcutaneous gas within the body wall diffusely. Electronically Signed   By: Helyn NumbersAshesh  Parikh M.D.   On: 2022-05-26 20:26   DG CHEST PORT 1 VIEW  Result Date: 2021/09/01 CLINICAL DATA:  NG tube placement EXAM: PORTABLE CHEST 1 VIEW COMPARISON:  CT 03/16/2022, chest x-ray 02/27/2018 FINDINGS: Endotracheal tube tip is about 3.4 cm superior to carina. Esophageal tube tip looped within the stomach. Extensive soft tissue emphysema within the chest and neck. Chest tube tip projects over the right apex. Normal cardiac size. Patchy airspace disease at the bases may be due to atelectasis. No definitive pneumothorax but assessment somewhat limited by extensive soft tissue emphysema. IMPRESSION: 1. Endotracheal tube tip about 3.4 cm superior to carina 2. Esophageal tube tip looped in the stomach 3. Extensive soft tissue emphysema over the chest wall and neck 4. Patchy atelectasis  at the bases Electronically Signed   By: Jasmine PangKim  Fujinaga M.D.   On: 2022-05-26 20:11    Medications / Allergies: per chart  Antibiotics: Anti-infectives (From admission, onward)    Start     Dose/Rate Route Frequency Ordered Stop   February 13, 2022 1230  vancomycin (VANCOREADY) IVPB 1500 mg/300 mL        1,500 mg 150 mL/hr over 120 Minutes Intravenous On call to O.R. 03/07/22 2150 February 13, 2022 1425   February 13, 2022 0600  gentamicin (GARAMYCIN) 300 mg in dextrose 5 % 100 mL IVPB       See Hyperspace for full Linked Orders Report.   300 mg 107.5 mL/hr over 60 Minutes Intravenous On call to O.R. 03/07/22 1321 February 13, 2022 1430   February 13, 2022 0600  Vancomycin (VANCOCIN) 1,500 mg in sodium chloride 0.9 % 500 mL IVPB  Status:  Discontinued        1,500 mg  250 mL/hr over 120 Minutes Intravenous  Once 03/07/22 1424 03/07/22 2154   03/07/22 1415  Vancomycin (VANCOCIN) 1,500 mg in sodium chloride 0.9 % 500 mL IVPB  Status:  Discontinued       See Hyperspace for full Linked Orders Report.   1,500 mg 250 mL/hr over 120 Minutes Intravenous  Once 03/07/22 1321 03/07/22 1425         Note: Portions of this report may have been transcribed using voice recognition software. Every effort was made to ensure accuracy; however, inadvertent computerized transcription errors may be present.   Any transcriptional errors that result from this process are unintentional.    Ardeth Sportsman, MD, FACS, MASCRS Esophageal, Gastrointestinal & Colorectal Surgery Robotic and Minimally Invasive Surgery  [facility] [address] [place], [place] [address] [phone] Fax [phone] Main  CONTACT INFORMATION:  Weekday (9AM-5PM): Call [facility] main office at [phone]  Weeknight (5PM-9AM) or Weekend/Holiday: Check [email or web address] (password " [email or web address]") for General Surgery [facility] coverage  (Please, do not use SecureChat as it is not reliable communication to reach operating  surgeons for immediate patient care)      03/09/2022  7:13 AM

## 2022-03-10 ENCOUNTER — Inpatient Hospital Stay (HOSPITAL_COMMUNITY): Payer: Medicare Other

## 2022-03-10 DIAGNOSIS — R0689 Other abnormalities of breathing: Secondary | ICD-10-CM | POA: Diagnosis not present

## 2022-03-10 DIAGNOSIS — R012 Other cardiac sounds: Secondary | ICD-10-CM

## 2022-03-10 DIAGNOSIS — K56609 Unspecified intestinal obstruction, unspecified as to partial versus complete obstruction: Secondary | ICD-10-CM | POA: Diagnosis not present

## 2022-03-10 LAB — ECHOCARDIOGRAM COMPLETE
AR max vel: 2.57 cm2
AV Peak grad: 4.6 mmHg
Ao pk vel: 1.07 m/s
Area-P 1/2: 5.54 cm2
Calc EF: 67.5 %
Height: 56 in
S' Lateral: 2.6 cm
Single Plane A2C EF: 65.7 %
Single Plane A4C EF: 67.3 %
Weight: 2800 oz

## 2022-03-10 LAB — COMPREHENSIVE METABOLIC PANEL
ALT: 35 U/L (ref 0–44)
AST: 31 U/L (ref 15–41)
Albumin: 2.4 g/dL — ABNORMAL LOW (ref 3.5–5.0)
Alkaline Phosphatase: 60 U/L (ref 38–126)
Anion gap: 4 — ABNORMAL LOW (ref 5–15)
BUN: 10 mg/dL (ref 6–20)
CO2: 23 mmol/L (ref 22–32)
Calcium: 9.7 mg/dL (ref 8.9–10.3)
Chloride: 110 mmol/L (ref 98–111)
Creatinine, Ser: 1.04 mg/dL — ABNORMAL HIGH (ref 0.44–1.00)
GFR, Estimated: >60 mL/min (ref >60)
Glucose, Bld: 131 mg/dL — ABNORMAL HIGH (ref 70–99)
Potassium: 5.4 mmol/L — ABNORMAL HIGH (ref 3.5–5.1)
Sodium: 137 mmol/L (ref 135–145)
Total Bilirubin: 0.9 mg/dL (ref 0.3–1.2)
Total Protein: 4.9 g/dL — ABNORMAL LOW (ref 6.5–8.1)

## 2022-03-10 LAB — CBC
HCT: 34.7 % — ABNORMAL LOW (ref 36.0–46.0)
Hemoglobin: 11.2 g/dL — ABNORMAL LOW (ref 12.0–15.0)
MCH: 31.1 pg (ref 26.0–34.0)
MCHC: 32.3 g/dL (ref 30.0–36.0)
MCV: 96.4 fL (ref 80.0–100.0)
Platelets: 238 10*3/uL (ref 150–400)
RBC: 3.6 MIL/uL — ABNORMAL LOW (ref 3.87–5.11)
RDW: 13.6 % (ref 11.5–15.5)
WBC: 15.3 10*3/uL — ABNORMAL HIGH (ref 4.0–10.5)
nRBC: 0 % (ref 0.0–0.2)

## 2022-03-10 LAB — GLUCOSE, CAPILLARY
Glucose-Capillary: 156 mg/dL — ABNORMAL HIGH (ref 70–99)
Glucose-Capillary: 145 mg/dL — ABNORMAL HIGH (ref 70–99)
Glucose-Capillary: 138 mg/dL — ABNORMAL HIGH (ref 70–99)
Glucose-Capillary: 125 mg/dL — ABNORMAL HIGH (ref 70–99)
Glucose-Capillary: 122 mg/dL — ABNORMAL HIGH (ref 70–99)
Glucose-Capillary: 104 mg/dL — ABNORMAL HIGH (ref 70–99)

## 2022-03-10 LAB — TROPONIN I (HIGH SENSITIVITY)
Troponin I (High Sensitivity): 220 ng/L (ref <18)
Troponin I (High Sensitivity): 222 ng/L (ref <18)

## 2022-03-10 LAB — SURGICAL PATHOLOGY

## 2022-03-10 LAB — BASIC METABOLIC PANEL
Anion gap: 12 (ref 5–15)
BUN: 9 mg/dL (ref 6–20)
CO2: 21 mmol/L — ABNORMAL LOW (ref 22–32)
Calcium: 9.7 mg/dL (ref 8.9–10.3)
Chloride: 105 mmol/L (ref 98–111)
Creatinine, Ser: 0.96 mg/dL (ref 0.44–1.00)
GFR, Estimated: >60 mL/min (ref >60)
Glucose, Bld: 162 mg/dL — ABNORMAL HIGH (ref 70–99)
Potassium: 4.9 mmol/L (ref 3.5–5.1)
Sodium: 138 mmol/L (ref 135–145)

## 2022-03-10 LAB — PHOSPHORUS: Phosphorus: 2.8 mg/dL (ref 2.5–4.6)

## 2022-03-10 LAB — MAGNESIUM: Magnesium: 2.3 mg/dL (ref 1.7–2.4)

## 2022-03-10 LAB — PREALBUMIN: Prealbumin: 6 mg/dL — ABNORMAL LOW (ref 18–38)

## 2022-03-10 LAB — LACTIC ACID, PLASMA: Lactic Acid, Venous: 0.9 mmol/L (ref 0.5–1.9)

## 2022-03-10 MED ORDER — PERFLUTREN LIPID MICROSPHERE
1.0000 mL | INTRAVENOUS | Status: AC | PRN
  Administered 2022-03-10: 2 mL via INTRAVENOUS

## 2022-03-10 MED ORDER — FENTANYL CITRATE PF 50 MCG/ML IJ SOSY: 25.0000 ug | PREFILLED_SYRINGE | INTRAMUSCULAR | Status: DC | PRN

## 2022-03-10 MED ORDER — CITALOPRAM HYDROBROMIDE 20 MG PO TABS
40.0000 mg | ORAL_TABLET | Freq: Every day | ORAL | Status: DC
  Administered 2022-03-10: 40 mg
  Filled 2022-03-10 (×2): qty 2

## 2022-03-10 MED ORDER — LORAZEPAM 2 MG/ML IJ SOLN: 0.2500 mg | Freq: Three times a day (TID) | INTRAMUSCULAR | Status: DC | PRN

## 2022-03-10 MED ORDER — SODIUM CHLORIDE 0.9% FLUSH
10.0000 mL | Freq: Two times a day (BID) | INTRAVENOUS | Status: DC
  Administered 2022-03-10: 10 mL
  Administered 2022-03-11: 20 mL

## 2022-03-10 MED ORDER — FUROSEMIDE 10 MG/ML IJ SOLN
40.0000 mg | Freq: Once | INTRAMUSCULAR | Status: AC
  Administered 2022-03-10: 40 mg via INTRAVENOUS

## 2022-03-10 MED ORDER — FENTANYL CITRATE PF 50 MCG/ML IJ SOSY
12.5000 ug | PREFILLED_SYRINGE | INTRAMUSCULAR | Status: DC | PRN
  Administered 2022-03-10 – 2022-03-12 (×6): 25 ug via INTRAVENOUS
  Filled 2022-03-10 (×6): qty 1

## 2022-03-10 MED ORDER — FUROSEMIDE 10 MG/ML IJ SOLN
INTRAMUSCULAR | Status: AC
  Filled 2022-03-10: qty 4

## 2022-03-10 MED ORDER — ALPRAZOLAM 0.5 MG PO TABS
0.2500 mg | ORAL_TABLET | Freq: Two times a day (BID) | ORAL | Status: DC
  Administered 2022-03-10 (×2): 0.25 mg
  Filled 2022-03-10 (×2): qty 1

## 2022-03-10 MED ORDER — ALPRAZOLAM 0.5 MG PO TABS: 0.2500 mg | ORAL_TABLET | Freq: Two times a day (BID) | ORAL | Status: DC

## 2022-03-10 MED ORDER — LEVOTHYROXINE SODIUM 137 MCG PO TABS
137.0000 ug | ORAL_TABLET | Freq: Every day | ORAL | Status: DC
  Administered 2022-03-11: 137 ug
  Filled 2022-03-10: qty 1

## 2022-03-10 MED ORDER — CITALOPRAM HYDROBROMIDE 20 MG PO TABS
40.0000 mg | ORAL_TABLET | Freq: Every day | ORAL | Status: DC
Start: 2022-03-10 — End: 2022-03-10
  Filled 2022-03-10: qty 2

## 2022-03-10 MED ORDER — ALBUMIN HUMAN 5 % IV SOLN
12.5000 g | Freq: Four times a day (QID) | INTRAVENOUS | Status: DC | PRN
  Administered 2022-03-10: 12.5 g via INTRAVENOUS
  Filled 2022-03-10: qty 250

## 2022-03-10 MED ORDER — ACETAMINOPHEN 500 MG PO TABS
1000.0000 mg | ORAL_TABLET | Freq: Four times a day (QID) | ORAL | Status: DC
  Administered 2022-03-10 – 2022-03-11 (×3): 1000 mg
  Filled 2022-03-10 (×3): qty 2

## 2022-03-10 MED ORDER — METHOCARBAMOL 1000 MG/10ML IJ SOLN
500.0000 mg | Freq: Three times a day (TID) | INTRAVENOUS | Status: DC
  Administered 2022-03-10 (×3): 500 mg via INTRAVENOUS
  Filled 2022-03-10: qty 5
  Filled 2022-03-10: qty 500
  Filled 2022-03-10 (×5): qty 5

## 2022-03-10 MED ORDER — BUSPIRONE HCL 15 MG PO TABS
15.0000 mg | ORAL_TABLET | Freq: Two times a day (BID) | ORAL | Status: DC
  Administered 2022-03-10 (×2): 15 mg
  Filled 2022-03-10 (×2): qty 1

## 2022-03-10 MED ORDER — SIMETHICONE 40 MG/0.6ML PO SUSP: 80.0000 mg | Freq: Four times a day (QID) | ORAL | Status: DC | PRN

## 2022-03-10 MED ORDER — BUSPIRONE HCL 15 MG PO TABS: 15.0000 mg | ORAL_TABLET | Freq: Two times a day (BID) | ORAL | Status: DC

## 2022-03-10 MED ORDER — LEVOTHYROXINE SODIUM 137 MCG PO TABS
137.0000 ug | ORAL_TABLET | Freq: Every day | ORAL | Status: DC
  Filled 2022-03-10: qty 1

## 2022-03-10 MED ORDER — FUROSEMIDE 10 MG/ML IJ SOLN
40.0000 mg | Freq: Once | INTRAMUSCULAR | Status: AC
  Administered 2022-03-10: 40 mg via INTRAVENOUS
  Filled 2022-03-10: qty 4

## 2022-03-10 MED ORDER — SODIUM CHLORIDE 0.9% FLUSH: 10.0000 mL | INTRAVENOUS | Status: DC | PRN

## 2022-03-10 MED ORDER — ALUM & MAG HYDROXIDE-SIMETH 200-200-20 MG/5ML PO SUSP: 30.0000 mL | Freq: Four times a day (QID) | ORAL | Status: DC | PRN

## 2022-03-10 MED ORDER — ACETAMINOPHEN 650 MG RE SUPP
650.0000 mg | Freq: Four times a day (QID) | RECTAL | Status: DC
  Administered 2022-03-10: 650 mg via RECTAL

## 2022-03-10 MED ORDER — ALPRAZOLAM 0.5 MG PO TABS: 0.2500 mg | ORAL_TABLET | Freq: Two times a day (BID) | ORAL | Status: DC | PRN

## 2022-03-10 MED ORDER — FLUTICASONE PROPIONATE 50 MCG/ACT NA SUSP
1.0000 | Freq: Every evening | NASAL | Status: DC
  Administered 2022-03-10 – 2022-03-24 (×11): 1 via NASAL
  Filled 2022-03-10: qty 16

## 2022-03-10 NOTE — Progress Notes (Signed)
CCM aware of persistent ST elevation alarm on bedside monitor. EKG is nl and does not show ST elevation.

## 2022-03-10 NOTE — Progress Notes (Signed)
2 Days Post-Op  Subjective: Extubated.  Having some abdominal pain.  On some pressor this am due to some hypotension.  Getting some fluid, but also a bit fluid overloaded.  No bowel function yet, but minimal NGT output.  Made 750cc of urine   Objective: Vital signs in last 24 hours: Temp:  [98.2 F (36.8 C)-99.5 F (37.5 C)] 99.2 F (37.3 C) (07/20 0718) Pulse Rate:  [80-120] 98 (07/20 0600) Resp:  [0-27] 25 (07/20 0600) BP: (77-117)/(39-79) 111/58 (07/20 0600) SpO2:  [96 %-100 %] 98 % (07/20 0600) Arterial Line BP: (105-185)/(62-182) 185/182 (07/19 1400) FiO2 (%):  [30 %] 30 % (07/19 1049) Last BM Date : 03/07/22  Intake/Output from previous day: 07/19 0701 - 07/20 0700 In: 3599.2 [I.V.:602; NG/GT:60; IV Piggyback:2937.2] Out: 1440 [Urine:750; Emesis/NG output:200; Drains:490] Intake/Output this shift: Total I/O In: 309.5 [I.V.:259.5; IV Piggyback:50] Out: 60 [Urine:40; Drains:20]  PE: Abd: soft, appropriately tender, incisions are c/d/I with gauze and tegaderm, right most lateral incision with ecchymosis surrounding it.  NGT with essentially no output this am.  JP drain bloody, thin output. 490cc yesterday.  Lab Results:  Recent Labs    03/09/22 1730 03/09/22 1810 03/10/22 0506  WBC 10.1  --  15.3*  HGB 9.1* 12.2 11.2*  HCT 29.2* 36.0 34.7*  PLT 175  --  238   BMET Recent Labs    03/09/22 2307 03/10/22 0506  NA 137 138  K 5.4* 4.9  CL 110 105  CO2 23 21*  GLUCOSE 131* 162*  BUN 10 9  CREATININE 1.04* 0.96  CALCIUM 9.7 9.7   PT/INR No results for input(s): "LABPROT", "INR" in the last 72 hours. CMP     Component Value Date/Time   NA 138 03/10/2022 0506   K 4.9 03/10/2022 0506   CL 105 03/10/2022 0506   CO2 21 (L) 03/10/2022 0506   GLUCOSE 162 (H) 03/10/2022 0506   BUN 9 03/10/2022 0506   CREATININE 0.96 03/10/2022 0506   CALCIUM 9.7 03/10/2022 0506   PROT 4.9 (L) 03/09/2022 2307   ALBUMIN 2.4 (L) 03/09/2022 2307   AST 31 03/09/2022 2307    ALT 35 03/09/2022 2307   ALKPHOS 60 03/09/2022 2307   BILITOT 0.9 03/09/2022 2307   GFRNONAA >60 03/10/2022 0506   GFRAA >60 03/04/2018 0427   Lipase     Component Value Date/Time   LIPASE 24 02/22/2022 1108       Studies/Results: DG CHEST PORT 1 VIEW  Result Date: 03/09/2022 CLINICAL DATA:  Central line placement. EXAM: PORTABLE CHEST 1 VIEW COMPARISON:  03/17/2022. FINDINGS: The heart size and mediastinal contours are stable. Patchy airspace disease is noted at the lung bases bilaterally. A right-sided chest tube is stable position. No effusion or pneumothorax. Extensive subcutaneous emphysema is slightly improved. An enteric tube terminates in the stomach. A left internal jugular central venous catheter terminates over the superior vena cava. An endotracheal tube terminates 3.9 cm above the carina. IMPRESSION: 1. Interval placement of left internal jugular central venous catheter which terminates over the superior vena cava. 2. Right-sided chest tube in place.  No pneumothorax. 3. Patchy airspace disease at the lung bases, unchanged. 4. Diffuse subcutaneous emphysema, improved from the prior exam. Electronically Signed   By: Thornell Sartorius M.D.   On: 03/09/2022 02:13   DG Abd Portable 1V  Result Date: 03/04/2022 CLINICAL DATA:  Nasogastric tube placement EXAM: PORTABLE ABDOMEN - 1 VIEW COMPARISON:  None Available. FINDINGS: Nasogastric tube extends into the  left upper quadrant the abdomen looped within the gastric fundus. Normal abdominal gas pattern. Extensive subcutaneous gas within the abdominal wall. IMPRESSION: Nasogastric tube looped within the gastric fundus. Extensive subcutaneous gas within the body wall diffusely. Electronically Signed   By: Helyn Numbers M.D.   On: 02/19/2022 20:26   DG CHEST PORT 1 VIEW  Result Date: 03/10/2022 CLINICAL DATA:  NG tube placement EXAM: PORTABLE CHEST 1 VIEW COMPARISON:  CT 02/23/2022, chest x-ray 02/27/2018 FINDINGS: Endotracheal tube tip is  about 3.4 cm superior to carina. Esophageal tube tip looped within the stomach. Extensive soft tissue emphysema within the chest and neck. Chest tube tip projects over the right apex. Normal cardiac size. Patchy airspace disease at the bases may be due to atelectasis. No definitive pneumothorax but assessment somewhat limited by extensive soft tissue emphysema. IMPRESSION: 1. Endotracheal tube tip about 3.4 cm superior to carina 2. Esophageal tube tip looped in the stomach 3. Extensive soft tissue emphysema over the chest wall and neck 4. Patchy atelectasis at the bases Electronically Signed   By: Jasmine Pang M.D.   On: 03/17/2022 20:11    Anti-infectives: Anti-infectives (From admission, onward)    Start     Dose/Rate Route Frequency Ordered Stop   03/09/2022 1230  vancomycin (VANCOREADY) IVPB 1500 mg/300 mL        1,500 mg 150 mL/hr over 120 Minutes Intravenous On call to O.R. 03/07/22 2150 03/12/2022 1425   03/14/2022 0600  gentamicin (GARAMYCIN) 300 mg in dextrose 5 % 100 mL IVPB       See Hyperspace for full Linked Orders Report.   300 mg 107.5 mL/hr over 60 Minutes Intravenous On call to O.R. 03/07/22 1321 03/17/2022 1430   03/21/2022 0600  Vancomycin (VANCOCIN) 1,500 mg in sodium chloride 0.9 % 500 mL IVPB  Status:  Discontinued        1,500 mg 250 mL/hr over 120 Minutes Intravenous  Once 03/07/22 1424 03/07/22 2154   03/07/22 1415  Vancomycin (VANCOCIN) 1,500 mg in sodium chloride 0.9 % 500 mL IVPB  Status:  Discontinued       See Hyperspace for full Linked Orders Report.   1,500 mg 250 mL/hr over 120 Minutes Intravenous  Once 03/07/22 1321 03/07/22 1425        Assessment/Plan POD 2, s/p  ROBOTIC RECURRENT ANTERIOR DIAPHRAGMATIC HERNIA REPAIR WITH MESH ROBOTIC LYSIS OF ADHESIONS by Dr. Michaell Cowing 7/18 -despite minimal output, continue NGT today.  Ok for oral meds per Dr. Michaell Cowing, clamp NGT x 1 hr then return to LIWS. -if continues with minimal output and has more bowel function, can likely start  CLD tomorrow with NGT clamped x 24 hrs then pull per his instructions -cont JP Drain -WBC up some today, will monitor -electrolytes better today overall  FEN - NPO/NGT VTE - Lovenox ID - none currently needed  Prader-Willi syndrome Acute hypoxic post op respiratory failure - resolved MR HLD OSA    LOS: 4 days    Letha Cape , [facility] Surgery 03/10/2022, 8:32 AM Please see Amion for pager number during day hours 7:00am-4:30pm or 7:00am -11:30am on weekends

## 2022-03-10 NOTE — Progress Notes (Signed)
NAME:  Barbara Ward, MRN:  [id number], DOB:  September 11, 1970, LOS: 4 ADMISSION DATE:  03/11/2022, CONSULTATION DATE:  03/12/2022 REFERRING MD:  EDP CHIEF COMPLAINT: Abdominal Pain    History of Present Illness:  Barbara Ward is a 51 y.o. F with PMH significant for Prader-Willi syndrome, HL, cognitive deficit, OSA, hypothyroidism, prior abdominal hernia repair 2019 who presented for abdominal pain on 7/15 and was found to have very large diaphramatic hernia with evidence of SBO.  She denied vomiting on admission and labs were largely WNL.   NG tube was placed and she was seen by surgery who recommended initial medical management; pt admitted Internal Medicine.  The decision to move forward with operative repair made as surgery felt risk of waiting outweighed benefit and she underwent operative repair 7/18.  She had an episode of hypotension and ET CO2 changes, therefore the decision made not to extubate immediately post-operatively and pt was transferred to ICU and PCCM consulted.  OP note mentions significant mediastinal adhesions with dense involvement of the R Pleura and 50% into the L pleura that required transection.  Pt had an episode of decreased end-tidal CO2 and crepitus of the chest, face and neck and large volume CO2 was evacuated.  No post-op pneumothorax or pneumomediastinum, JP drain in the R pleural space. Pertinent  Medical History   has a past medical history of Allergic rhinitis, Hyperlipidemia, Hypokalemia, MR (mental retardation), Prader-Willi syndrome, Sleep apnea, and Thyroid disease. Significant Hospital Events: Including procedures, antibiotic start and stop dates in addition to other pertinent events   07/15: Admitted to hospital for SBO in setting of diaphragmatic hernia 07/18: Repair of hernia, episode of hypotension and elevated end tidal CO2 so left intubated and transferred to ICU. On Neo for pressor support.  07/19: Off pressors in the am. Extubated in the am. Placed back on  pressors in the evening.  07/20: Lasix given.   Interim History / Subjective:  Concern for drowsiness yesterday, given Narcan that improved it. Concern for decreased UOP, 2 L LR given yesterday and albumin 5 % given overnight. Following commands. Denying any acute complaints.  Review of Systems:   Negative unless stated in the subjective.  Objective   Blood pressure (!) 111/58, pulse 98, temperature 99.2 F (37.3 C), temperature source Oral, resp. rate (!) 25, height 4\' 8"  (1.422 m), weight 79.4 kg, SpO2 98 %. CVP:  [8 mmHg] 8 mmHg    Intake/Output Summary (Last 24 hours) at 03/10/2022 0832 Last data filed at 03/10/2022 0800 Gross per 24 hour  Intake 3727.16 ml  Output 1410 ml  Net 2317.16 ml    Filed Weights   02/19/2022 1101  Weight: 79.4 kg   Intake: 3.59 L Output: Urine 750 cc Drains: 490 cc NG tube: 200 cc  Examination:  General: NAD HENT: NCAT Lungs: bibasilar rales Cardiovascular: NSR, good radial pulses. good cap refill, JV distended up to mandible.  Abdomen: Soft, diminished bowel sounds, dressing present on abdomen with multiple incisions. Drain present on right upper quadrant.  Extremities: Contracture of bilateral feet, non-pitting edema.  Neuro: follows commands GU: foley present Consults  General Surgery Resolved Hospital Problem list   Hypercarbic Respiratory Failure Assessment & Plan:  Large diaphragmatic hernia with SBO With adhesions to the mediastinum, L and R pleura s/p repair. Slight leukocytosis this am which may be due to pain which intermittently resolved after fluid bolus and may be due to hemodilution. NG tube to suction with dark output.  -General Surgery following,  ok for meds through the tube.  -Dilaudid for pain control -JP drain in place  Hypotension Transient during surgery, required low dose Neo on presentation to the ICU.  Lactic down-trending. Was placed back on levophed due to hypotension concern. Jugular vein distention present and  rales on exam. Will give IV lasix today. Troponin and LA pending as EKG showed some ischemic concern. Echo ordered.  -Follow up studies -IV lasix 40 mg given.   Hypokalemia Hypomagnesemia K 4.9, Mag at 2.3.  -CTM and replete as needed.    Hyperglycemia: Goal 140-180. Currently under 180.  -Continue SSI.   Hypertension - Hold home ramipril.   Hyperlipidemia - Will start home med today.   Hypothyroidism - Will start home med today.   Vitamin D deficiency - Will start home med today.  Lymphedema - Hold home Lasix.   Anxiety -Will start home med today.  Best Practice (right click and "Reselect all SmartList Selections" daily)  Diet/type: NPO with meds DVT prophylaxis: Lovenox GI prophylaxis: PPI Lines: Central line Foley:  Yes, and it is still needed Continuous: levo 4 mcg  Code Status:  full code Last date of multidisciplinary goals of care discussion [Plan for 07/19] Labs CBC shows leukocytosis at 15.3, hgb at 11.2. BMP shows normal K, normal calcium. Phos at 2.8.  CBC: Recent Labs  Lab 03/07/22 0244 02/20/2022 0551 03/15/2022 1842 03/09/22 0236 03/09/22 0520 03/09/22 1730 03/09/22 1810 03/10/22 0506  WBC 6.3 7.5  --  14.5*  --  10.1  --  15.3*  HGB 13.6 13.4   < > 12.6 12.6 9.1* 12.2 11.2*  HCT 43.0 39.9   < > 37.5 37.0 29.2* 36.0 34.7*  MCV 97.9 94.3  --  93.3  --  99.3  --  96.4  PLT 223 228  --  199  --  175  --  238   < > = values in this interval not displayed.   Basic Metabolic Panel: Recent Labs  Lab 03/06/22 0552 03/07/22 0244 03/09/22 0236 03/09/22 0520 03/09/22 1325 03/09/22 1756 03/09/22 1810 03/09/22 2307 03/10/22 0506  NA 139   < > 137   < > 143 140 138 137 138  K 3.5   < > 2.9*   < > 3.5 4.1 5.0 5.4* 4.9  CL 103   < > 107  --  118* 117*  --  110 105  CO2 26   < > 20*  --  18* 21*  --  23 21*  GLUCOSE 100*   < > 165*  --  81 75  --  131* 162*  BUN 10   < > 7  --  5* 7  --  10 9  CREATININE 0.91   < > 0.72  --  0.50 0.67  --  1.04*  0.96  CALCIUM 8.3*   < > 8.4*  --  6.7* 8.1*  --  9.7 9.7  MG 2.1  --  1.4*  --   --   --   --   --  2.3  PHOS 2.7  --   --   --   --   --   --   --  2.8   < > = values in this interval not displayed.   GFR: Estimated Creatinine Clearance: 59.9 mL/min (by C-G formula based on SCr of 0.96 mg/dL). Recent Labs  Lab 02/27/2022 0551 03/09/22 0014 03/09/22 0231 03/09/22 0236 03/09/22 1730 03/10/22 0506  WBC 7.5  --   --  14.5* 10.1 15.3*  LATICACIDVEN  --  2.5* 1.9  --  1.4  --    Liver Function Tests: Recent Labs  Lab April 03, 2022 1108 03/09/22 0236 03/09/22 2307  AST 22 48* 31  ALT 16 43 35  ALKPHOS 78 54 60  BILITOT 1.1 0.7 0.9  PROT 7.0 4.8* 4.9*  ALBUMIN 3.7 2.8* 2.4*   Recent Labs  Lab 2022/04/03 1108  LIPASE 24   No results for input(s): "AMMONIA" in the last 168 hours. ABG    Component Value Date/Time   PHART 7.145 (LL) 03/09/2022 1810   PCO2ART 71.6 (HH) 03/09/2022 1810   PO2ART 144 (H) 03/09/2022 1810   HCO3 24.6 03/09/2022 1810   TCO2 27 03/09/2022 1810   ACIDBASEDEF 5.0 (H) 03/09/2022 1810   O2SAT 98 03/09/2022 1810    Coagulation Profile: No results for input(s): "INR", "PROTIME" in the last 168 hours. Cardiac Enzymes: No results for input(s): "CKTOTAL", "CKMB", "CKMBINDEX", "TROPONINI" in the last 168 hours. HbA1C: Hgb A1c MFr Bld  Date/Time Value Ref Range Status  03/09/2022 02:36 AM 5.6 4.8 - 5.6 % Final    Comment:    (NOTE) Pre diabetes:          5.7%-6.4%  Diabetes:              >6.4%  Glycemic control for   <7.0% adults with diabetes    CBG: Recent Labs  Lab 03/09/22 1911 03/09/22 1945 03/09/22 2306 03/10/22 0304 03/10/22 0715  GLUCAP 55* 125* 136* 156* 145*   Past Medical History:  She,  has a past medical history of Allergic rhinitis, Hyperlipidemia, Hypokalemia, MR (mental retardation), Prader-Willi syndrome, Sleep apnea, and Thyroid disease.  Surgical History:   Past Surgical History:  Procedure Laterality Date    ESOPHAGOGASTRODUODENOSCOPY (EGD) WITH PROPOFOL N/A 02/25/2018   Procedure: ESOPHAGOGASTRODUODENOSCOPY (EGD) WITH PROPOFOL;  Surgeon: Kathi Der, MD;  Location: [facility];  Service: Gastroenterology;  Laterality: N/A;   HERNIA REPAIR     HIATAL HERNIA REPAIR N/A 02/27/2018   Procedure: LAPAROSCOPIC REPAIR OF HIATAL HERNIA;  Surgeon: Ovidio Kin, MD;  Location: [facility] OR;  Service: General;  Laterality: N/A;   LAPAROSCOPIC GASTROSTOMY N/A 02/27/2018   Procedure: LAPAROSCOPIC GASTROSTOMY;  Surgeon: Ovidio Kin, MD;  Location: [facility] OR;  Service: General;  Laterality: N/A;   THYROIDECTOMY     XI ROBOTIC ASSISTED HIATAL HERNIA REPAIR N/A 02/23/2022   Procedure: XI ROBOTIC ASSISTED RECURRENT DIAPHRAGMATIC HERNIA REPAIR;  Surgeon: Karie Soda, MD;  Location: [facility] OR;  Service: General;  Laterality: N/A;    Social History:   reports that she has never smoked. She has never used smokeless tobacco. She reports that she does not drink alcohol and does not use drugs.  Family History:  Her family history is not on file.  Allergies Allergies  Allergen Reactions   Codeine Other (See Comments)    Unknown reaction   Penicillins Other (See Comments)    Unknown reaction   Tape Other (See Comments)    "surgical tape" - unknown reaction    Home Medications  Prior to Admission medications   Medication Sig Start Date End Date Taking? Authorizing Provider  albuterol (VENTOLIN HFA) 108 (90 Base) MCG/ACT inhaler Inhale 2 puffs into the lungs every 6 (six) hours as needed for wheezing or shortness of breath.   Yes [provider]  ALPRAZolam (XANAX) 0.25 MG tablet Take 0.25 mg by mouth every 12 (twelve) hours.   Yes [provider]  atorvastatin (LIPITOR) 40 MG tablet  Take 1 tablet (40 mg total) by mouth daily at 6 PM. 03/06/18  Yes Riccio, Angela C, DO  busPIRone (BUSPAR) 15 MG tablet Take 15 mg by mouth 2 (two) times daily.   Yes [provider]  busPIRone (BUSPAR) 15 MG tablet Take  15 mg by mouth 3 (three) times daily.   Yes [provider]  Cholecalciferol (VITAMIN D3) 50 MCG (2000 UT) capsule Take 2,000 Units by mouth daily.   Yes [provider]  citalopram (CELEXA) 40 MG tablet Take 40 mg by mouth daily.   Yes [provider]  docusate sodium (COLACE) 100 MG capsule Take 100 mg by mouth every evening.   Yes [provider]  fluticasone (FLONASE) 50 MCG/ACT nasal spray Place 1 spray into both nostrils every evening.    Yes [provider]  furosemide (LASIX) 40 MG tablet Take 40 mg by mouth daily.   Yes [provider]  glucosamine-chondroitin 500-400 MG tablet Take 1 tablet by mouth every morning.   Yes [provider]  levothyroxine (SYNTHROID, LEVOTHROID) 137 MCG tablet Take 137 mcg by mouth daily before breakfast.   Yes [provider]  loratadine (CLARITIN) 10 MG tablet Take 10 mg by mouth every morning.   Yes [provider]  Multiple Vitamin (MULTIVITAMIN WITH MINERALS) TABS tablet Take 1 tablet by mouth every morning.   Yes [provider]  potassium chloride (K-DUR) 10 MEQ tablet Take 10 mEq by mouth every morning.   Yes [provider]  PSYLLIUM FIBER PO Take 1 tablet by mouth in the morning and at bedtime.   Yes [provider]  ramipril (ALTACE) 10 MG capsule Take 10 mg by mouth daily.   Yes [provider]  Zinc Oxide (DESITIN RAPID RELIEF) 13 % CREA Apply 1 Application topically daily as needed (Apply to buttock and genital area after using restroom).   Yes [provider]    Barbara Abbot, MD Eligha Bridegroom. [facility] Internal Medicine Residency, PGY-2

## 2022-03-10 NOTE — TOC Progression Note (Addendum)
Transition of Care [facility]) - Progression Note    Patient Details  Name: OLIVIAROSE PUNCH MRN: [id number] Date of Birth: 1970-11-08  Transition of [facility] CM/SW Contact  Beckie Busing, RN Phone Number:[phone]  03/10/2022, 2:36 PM  Clinical Narrative:     Transition of Care (TOC) Screening Note   Patient Details  Name: GLADIE GRAVETTE Date of Birth: 01/16/1971   Transition of [facility]) CM/SW Contact:    Beckie Busing, RN Phone Number: 03/10/2022, 2:36 PM    Transition of Care Department [facility]) has reviewed patient and no TOC needs have been identified at this time. Patient is from a group home with plans to return.  We will continue to monitor patient advancement through interdisciplinary progression rounds.      Expected Discharge Plan: Group Home Barriers to Discharge: Continued Medical Work up  Expected Discharge Plan and Services Expected Discharge Plan: Group Home       Living arrangements for the past 2 months: Group Home                                       Social Determinants of Health (SDOH) Interventions    Readmission Risk Interventions     No data to display

## 2022-03-10 NOTE — Evaluation (Signed)
Physical Therapy Evaluation Patient Details Name: Barbara Ward MRN: [id number] DOB: 30-Jul-1971 Today's Date: 03/10/2022  History of Present Illness  pt is a 51 y/o female admitted 7/15 for abdominal pain due to a very large diaphramatic hernia with evidence of SBO.  Hernia repair 7/18 and left intubated  7/19  extubated.  PMHx:  MR, Prader-Willi syndrome  Clinical Impression  Pt admitted with/for hernia with surgical intervention as described above.  Pt needing mod to max assist of 1-2 persons for basic mobility at this time..  Pt currently limited functionally due to the problems listed. ( See problems list.)   Pt will benefit from PT to maximize function and safety in order to get ready for next venue listed below.        Recommendations for follow up therapy are one component of a multi-disciplinary discharge planning process, led by the attending physician.  Recommendations may be updated based on patient status, additional functional criteria and insurance authorization.  Follow Up Recommendations Skilled nursing-short term rehab (<3 hours/day) Can patient physically be transported by private vehicle: No    Assistance Recommended at Discharge Frequent or constant Supervision/Assistance  Patient can return home with the following  A little help with walking and/or transfers;A little help with bathing/dressing/bathroom;Assistance with cooking/housework;Assist for transportation;Help with stairs or ramp for entrance    Equipment Recommendations  (TBD, likely none)  Recommendations for Other Services       Functional Status Assessment Patient has had a recent decline in their functional status and demonstrates the ability to make significant improvements in function in a reasonable and predictable amount of time.     Precautions / Restrictions Precautions Precautions: Fall      Mobility  Bed Mobility Overal bed mobility: Needs Assistance Bed Mobility: Supine to Sit, Sit to  Supine     Supine to sit: Mod assist, Max assist, HOB elevated Sit to supine: Max assist, +2 for physical assistance   General bed mobility comments: pt assist minimally with bridging, took the initiative to try coming up and forward herself, but needed mod truncal assist    Transfers                   General transfer comment: NT today    Ambulation/Gait                  Stairs            Wheelchair Mobility    Modified Rankin (Stroke Patients Only)       Balance Overall balance assessment: Needs assistance Sitting-balance support: Single extremity supported, Bilateral upper extremity supported, Feet unsupported Sitting balance-[name]: Poor Sitting balance - Comments: reliant on UE's, sat EOB in posterior tilted position, significant kyphotice/low toned truncal position.                                     Pertinent Vitals/Pain Pain Assessment Pain Assessment: Faces Faces Pain Scale: No hurt Pain Intervention(s): Monitored during session    Home Living Family/patient expects to be discharged to:: Group home                   Additional Comments: ramped entrance, supervised environment    Prior Function Prior Level of Function : Other (comment) (mod I in a group home setting)             Mobility Comments: uses RW  to ambulate, ADLs Comments: can take a shower, toilet, and dress herself.  helps out around the house, laundry etc.  Goes to adult day care by [facility].     Hand Dominance        Extremity/Trunk Assessment   Upper Extremity Assessment Upper Extremity Assessment: Generalized weakness    Lower Extremity Assessment Lower Extremity Assessment: Generalized weakness    Cervical / Trunk Assessment Cervical / Trunk Assessment: Kyphotic  Communication   Communication: No difficulties  Cognition Arousal/Alertness: Awake/alert Behavior During Therapy: WFL for tasks assessed/performed Overall  Cognitive Status: History of cognitive impairments - at baseline                                          General Comments General comments (skin integrity, edema, etc.): vss,  SpO2 maintained on RA over 91% thoughout, dropped slowly over course of session.    Exercises Other Exercises Other Exercises: bicep/tricep presses graded assist/resistance x 5 bil Other Exercises: hip/knee flex/ext ROM  graded active assist/resistance x 5 reps bil   Assessment/Plan    PT Assessment Patient needs continued PT services  PT Problem List Decreased strength;Decreased activity tolerance;Decreased balance;Decreased mobility;Decreased coordination       PT Treatment Interventions Gait training;Functional mobility training;Therapeutic activities;Therapeutic exercise;Balance training;Patient/family education    PT Goals (Current goals can be found in the Care Plan section)  Acute Rehab PT Goals PT Goal Formulation: Patient unable to participate in goal setting Time For Goal Achievement: 04/06/2022 Potential to Achieve Goals: Good    Frequency Min 3X/week     Co-evaluation               AM-PAC PT "6 Clicks" Mobility  Outcome Measure Help needed turning from your back to your side while in a flat bed without using bedrails?: A Lot Help needed moving from lying on your back to sitting on the side of a flat bed without using bedrails?: A Lot Help needed moving to and from a bed to a chair (including a wheelchair)?: Total Help needed standing up from a chair using your arms (e.g., wheelchair or bedside chair)?: Total Help needed to walk in hospital room?: Total Help needed climbing 3-5 steps with a railing? : Total 6 Click Score: 8    End of Session Equipment Utilized During Treatment: Oxygen Activity Tolerance: Patient tolerated treatment well;Patient limited by fatigue Patient left: in bed;with call bell/phone within reach;with family/visitor present Nurse Communication:  Mobility status PT Visit Diagnosis: Other abnormalities of gait and mobility (R26.89);Muscle weakness (generalized) (M62.81)    Time: 5176-1607 PT Time Calculation (min) (ACUTE ONLY): 50 min   Charges:   PT Evaluation $PT Eval Moderate Complexity: 1 Mod PT Treatments $Therapeutic Activity: 23-37 mins        03/10/2022  Jacinto Halim., PT Acute Rehabilitation Services [phone]  (pager) [phone]  (office)  Eliseo Gum Kura Bethards 03/10/2022, 6:00 PM

## 2022-03-11 ENCOUNTER — Inpatient Hospital Stay (HOSPITAL_COMMUNITY): Payer: Medicare Other

## 2022-03-11 DIAGNOSIS — R0689 Other abnormalities of breathing: Secondary | ICD-10-CM | POA: Diagnosis not present

## 2022-03-11 DIAGNOSIS — K56609 Unspecified intestinal obstruction, unspecified as to partial versus complete obstruction: Secondary | ICD-10-CM | POA: Diagnosis not present

## 2022-03-11 LAB — GLUCOSE, CAPILLARY
Glucose-Capillary: 108 mg/dL — ABNORMAL HIGH (ref 70–99)
Glucose-Capillary: 99 mg/dL (ref 70–99)
Glucose-Capillary: 127 mg/dL — ABNORMAL HIGH (ref 70–99)
Glucose-Capillary: 119 mg/dL — ABNORMAL HIGH (ref 70–99)
Glucose-Capillary: 136 mg/dL — ABNORMAL HIGH (ref 70–99)
Glucose-Capillary: 109 mg/dL — ABNORMAL HIGH (ref 70–99)

## 2022-03-11 LAB — CBC
HCT: 29.5 % — ABNORMAL LOW (ref 36.0–46.0)
Hemoglobin: 9.8 g/dL — ABNORMAL LOW (ref 12.0–15.0)
MCH: 31.1 pg (ref 26.0–34.0)
MCHC: 33.2 g/dL (ref 30.0–36.0)
MCV: 93.7 fL (ref 80.0–100.0)
Platelets: 235 10*3/uL (ref 150–400)
RBC: 3.15 MIL/uL — ABNORMAL LOW (ref 3.87–5.11)
RDW: 13.9 % (ref 11.5–15.5)
WBC: 13.5 10*3/uL — ABNORMAL HIGH (ref 4.0–10.5)
nRBC: 0 % (ref 0.0–0.2)

## 2022-03-11 LAB — BASIC METABOLIC PANEL
Anion gap: 6 (ref 5–15)
BUN: 9 mg/dL (ref 6–20)
CO2: 27 mmol/L (ref 22–32)
Calcium: 8.9 mg/dL (ref 8.9–10.3)
Chloride: 108 mmol/L (ref 98–111)
Creatinine, Ser: 0.72 mg/dL (ref 0.44–1.00)
GFR, Estimated: >60 mL/min (ref >60)
Glucose, Bld: 106 mg/dL — ABNORMAL HIGH (ref 70–99)
Potassium: 3.8 mmol/L (ref 3.5–5.1)
Sodium: 141 mmol/L (ref 135–145)

## 2022-03-11 LAB — PHOSPHORUS: Phosphorus: 2.9 mg/dL (ref 2.5–4.6)

## 2022-03-11 LAB — MAGNESIUM: Magnesium: 2 mg/dL (ref 1.7–2.4)

## 2022-03-11 MED ORDER — CITALOPRAM HYDROBROMIDE 20 MG PO TABS
40.0000 mg | ORAL_TABLET | Freq: Every day | ORAL | Status: DC
  Administered 2022-03-11 – 2022-03-24 (×13): 40 mg via ORAL
  Filled 2022-03-11 (×13): qty 2

## 2022-03-11 MED ORDER — ATORVASTATIN CALCIUM 40 MG PO TABS: 40.0000 mg | ORAL_TABLET | Freq: Every day | ORAL | Status: DC

## 2022-03-11 MED ORDER — SENNA 8.6 MG PO TABS: 1.0000 | ORAL_TABLET | Freq: Two times a day (BID) | ORAL | Status: DC

## 2022-03-11 MED ORDER — POTASSIUM CHLORIDE 20 MEQ PO PACK
40.0000 meq | PACK | Freq: Once | ORAL | Status: AC
  Administered 2022-03-11: 40 meq
  Filled 2022-03-11: qty 2

## 2022-03-11 MED ORDER — VITAMIN D3 50 MCG (2000 UT) PO CAPS: 2000.0000 [IU] | ORAL_CAPSULE | Freq: Every day | ORAL | Status: DC

## 2022-03-11 MED ORDER — METHOCARBAMOL 500 MG PO TABS
500.0000 mg | ORAL_TABLET | Freq: Three times a day (TID) | ORAL | Status: DC
  Administered 2022-03-11 – 2022-03-24 (×39): 500 mg via ORAL
  Filled 2022-03-11 (×40): qty 1

## 2022-03-11 MED ORDER — FUROSEMIDE 40 MG PO TABS
40.0000 mg | ORAL_TABLET | Freq: Every day | ORAL | Status: DC
  Administered 2022-03-11 – 2022-03-15 (×5): 40 mg via ORAL
  Filled 2022-03-11 (×5): qty 1

## 2022-03-11 MED ORDER — LEVOTHYROXINE SODIUM 25 MCG PO TABS
137.0000 ug | ORAL_TABLET | Freq: Every day | ORAL | Status: DC
  Administered 2022-03-12 – 2022-03-24 (×13): 137 ug via ORAL
  Filled 2022-03-11 (×13): qty 1

## 2022-03-11 MED ORDER — SENNA 8.6 MG PO TABS
1.0000 | ORAL_TABLET | Freq: Two times a day (BID) | ORAL | Status: DC
  Administered 2022-03-11 – 2022-03-22 (×16): 8.6 mg via ORAL
  Filled 2022-03-11 (×19): qty 1

## 2022-03-11 MED ORDER — BUSPIRONE HCL 5 MG PO TABS
15.0000 mg | ORAL_TABLET | Freq: Two times a day (BID) | ORAL | Status: DC
  Administered 2022-03-11 – 2022-03-24 (×26): 15 mg via ORAL
  Filled 2022-03-11: qty 1
  Filled 2022-03-11 (×7): qty 3
  Filled 2022-03-11: qty 1
  Filled 2022-03-11 (×5): qty 3
  Filled 2022-03-11: qty 1
  Filled 2022-03-11 (×11): qty 3

## 2022-03-11 MED ORDER — VITAMIN D 25 MCG (1000 UNIT) PO TABS
2000.0000 [IU] | ORAL_TABLET | Freq: Every day | ORAL | Status: DC
Start: 2022-03-11 — End: 2022-03-25
  Administered 2022-03-11 – 2022-03-24 (×13): 2000 [IU] via ORAL
  Filled 2022-03-11 (×13): qty 2

## 2022-03-11 MED ORDER — VITAMIN D 25 MCG (1000 UNIT) PO TABS: 2000.0000 [IU] | ORAL_TABLET | Freq: Every day | ORAL | Status: DC

## 2022-03-11 MED ORDER — OXYCODONE HCL 5 MG PO TABS
5.0000 mg | ORAL_TABLET | ORAL | Status: DC | PRN
  Administered 2022-03-12: 10 mg via ORAL
  Administered 2022-03-13 – 2022-03-14 (×2): 5 mg via ORAL
  Administered 2022-03-14: 10 mg via ORAL
  Administered 2022-03-15: 5 mg via ORAL
  Administered 2022-03-16: 10 mg via ORAL
  Administered 2022-03-18 – 2022-03-24 (×8): 5 mg via ORAL
  Filled 2022-03-11: qty 2
  Filled 2022-03-11 (×3): qty 1
  Filled 2022-03-11: qty 2
  Filled 2022-03-11 (×5): qty 1
  Filled 2022-03-11: qty 2
  Filled 2022-03-11 (×3): qty 1

## 2022-03-11 MED ORDER — FUROSEMIDE 40 MG PO TABS: 40.0000 mg | ORAL_TABLET | Freq: Every day | ORAL | Status: DC

## 2022-03-11 MED ORDER — POLYETHYLENE GLYCOL 3350 17 G PO PACK: 17.0000 g | PACK | Freq: Every day | ORAL | Status: DC

## 2022-03-11 MED ORDER — ATORVASTATIN CALCIUM 40 MG PO TABS
40.0000 mg | ORAL_TABLET | Freq: Every day | ORAL | Status: DC
  Administered 2022-03-11 – 2022-03-23 (×13): 40 mg via ORAL
  Filled 2022-03-11 (×13): qty 1

## 2022-03-11 MED ORDER — ALUM & MAG HYDROXIDE-SIMETH 200-200-20 MG/5ML PO SUSP: 30.0000 mL | Freq: Four times a day (QID) | ORAL | Status: DC | PRN

## 2022-03-11 MED ORDER — SIMETHICONE 40 MG/0.6ML PO SUSP: 80.0000 mg | Freq: Four times a day (QID) | ORAL | Status: DC | PRN

## 2022-03-11 MED ORDER — ATORVASTATIN CALCIUM 40 MG PO TABS
40.0000 mg | ORAL_TABLET | Freq: Every day | ORAL | Status: DC
Start: 2022-03-11 — End: 2022-03-11

## 2022-03-11 MED ORDER — ACETAMINOPHEN 500 MG PO TABS
1000.0000 mg | ORAL_TABLET | Freq: Four times a day (QID) | ORAL | Status: DC
  Administered 2022-03-11 – 2022-03-24 (×45): 1000 mg via ORAL
  Filled 2022-03-11 (×48): qty 2

## 2022-03-11 MED ORDER — BISACODYL 10 MG RE SUPP: 10.0000 mg | Freq: Every day | RECTAL | Status: DC | PRN

## 2022-03-11 MED ORDER — ALPRAZOLAM 0.5 MG PO TABS
0.2500 mg | ORAL_TABLET | Freq: Two times a day (BID) | ORAL | Status: DC
  Administered 2022-03-11 – 2022-03-24 (×26): 0.25 mg via ORAL
  Filled 2022-03-11 (×26): qty 1

## 2022-03-11 MED ORDER — METHOCARBAMOL 500 MG PO TABS: 500.0000 mg | ORAL_TABLET | Freq: Three times a day (TID) | ORAL | Status: DC

## 2022-03-11 MED ORDER — POLYETHYLENE GLYCOL 3350 17 G PO PACK
17.0000 g | PACK | Freq: Every day | ORAL | Status: DC
  Administered 2022-03-11 – 2022-03-14 (×4): 17 g via ORAL
  Filled 2022-03-11 (×5): qty 1

## 2022-03-11 NOTE — Progress Notes (Signed)
NAME:  Barbara Ward, MRN:  [id number], DOB:  May 16, 1971, LOS: 5 ADMISSION DATE:  03/21/2022, CONSULTATION DATE:  March 10, 2022 REFERRING MD:  EDP CHIEF COMPLAINT: Abdominal Pain    History of Present Illness:  Barbara Ward is a 51 y.o. F with PMH significant for Prader-Willi syndrome, HL, cognitive deficit, OSA, hypothyroidism, prior abdominal hernia repair 2019 who presented for abdominal pain on 7/15 and was found to have very large diaphramatic hernia with evidence of SBO.  She denied vomiting on admission and labs were largely WNL.   NG tube was placed and she was seen by surgery who recommended initial medical management; pt admitted Internal Medicine.  The decision to move forward with operative repair made as surgery felt risk of waiting outweighed benefit and she underwent operative repair 7/18.  She had an episode of hypotension and ET CO2 changes, therefore the decision made not to extubate immediately post-operatively and pt was transferred to ICU and PCCM consulted.  OP note mentions significant mediastinal adhesions with dense involvement of the R Pleura and 50% into the L pleura that required transection.  Pt had an episode of decreased end-tidal CO2 and crepitus of the chest, face and neck and large volume CO2 was evacuated.  No post-op pneumothorax or pneumomediastinum, JP drain in the R pleural space. Pertinent  Medical History   has a past medical history of Allergic rhinitis, Hyperlipidemia, Hypokalemia, MR (mental retardation), Prader-Willi syndrome, Sleep apnea, and Thyroid disease. Significant Hospital Events: Including procedures, antibiotic start and stop dates in addition to other pertinent events   07/15: Admitted to hospital for SBO in setting of diaphragmatic hernia 07/18: Repair of hernia, episode of hypotension and elevated end tidal CO2 so left intubated and transferred to ICU. On Neo for pressor support.  07/19: Off pressors in the am. Extubated in the am. Placed back on  pressors in the evening.  07/20: IV Lasix given x2  Interim History / Subjective:   Review of Systems:   Negative unless stated in the subjective.  Objective: Afebrile, MAP>65  Blood pressure 109/73, pulse (!) 101, temperature 98.5 F (36.9 C), temperature source Oral, resp. rate 18, height 4\' 8"  (1.422 m), weight 79.4 kg, SpO2 99 %.      Intake/Output Summary (Last 24 hours) at 03/11/2022 0724 Last data filed at 03/11/2022 0500 Gross per 24 hour  Intake 635.12 ml  Output 2620 ml  Net -1984.88 ml     Filed Weights   03/06/2022 1101  Weight: 79.4 kg   Intake: 635 cc L Output: Urine 2.3 L  Drains: 330 cc NG tube: 0 cc Net: -1.98 L  Examination:  General: NAD HENT: NCAT on [place] Lungs: bibasilar rales Cardiovascular: NSR, good radial pulses. good cap refill Abdomen: Soft, diminished bowel sounds, dressing present on abdomen with multiple incisions. Drain present on right upper quadrant with serosanguinous drainage.  Extremities: Contracture of bilateral feet, non-pitting edema.  Neuro: follows commands GU: foley present Consults  General Surgery Resolved Hospital Problem list   Hypercarbic Respiratory Failure, Hypotension Assessment & Plan:  Large diaphragmatic hernia with SBO With adhesions to the mediastinum, L and R pleura s/p repair. Slight leukocytosis this am which is improving. CXR this am.  -General Surgery following, ok for clear liquids this am.  -Follow up CXR.  -Dilaudid for pain control -JP drain in place  Hypokalemia Hypomagnesemia K 3.8, Mag at 2.0.  -CTM and replete as needed.    Hyperglycemia: Goal 140-180. Currently under 180.  -Continue SSI.  Hypertension - Hold home ramipril due to concern for hypotension.    Hyperlipidemia - Continue home med  Hypothyroidism - Continue home med  Vitamin D deficiency - Continue home med  Lymphedema - Continue home med   Anxiety -Continue home meds Best Practice (right click and "Reselect all  SmartList Selections" daily)  Diet/type: NPO with meds DVT prophylaxis: Lovenox GI prophylaxis: PPI Lines: Central line Foley:  Yes, and it is still needed Continuous: levo 4 mcg  Code Status:  full code Last date of multidisciplinary goals of care discussion [Plan for 07/19] Labs CBC shows leukocytosis at 15.3, hgb at 11.2. BMP shows normal K, normal calcium. Phos at 2.8.  CBC: Recent Labs  Lab Mar 21, 2022 0551 02/24/2022 1842 03/09/22 0236 03/09/22 0520 03/09/22 1730 03/09/22 1810 03/10/22 0506 03/11/22 0500  WBC 7.5  --  14.5*  --  10.1  --  15.3* 13.5*  HGB 13.4   < > 12.6 12.6 9.1* 12.2 11.2* 9.8*  HCT 39.9   < > 37.5 37.0 29.2* 36.0 34.7* 29.5*  MCV 94.3  --  93.3  --  99.3  --  96.4 93.7  PLT 228  --  199  --  175  --  238 235   < > = values in this interval not displayed.    Basic Metabolic Panel: Recent Labs  Lab 03/06/22 0552 03/07/22 0244 03/09/22 0236 03/09/22 0520 03/09/22 1325 03/09/22 1756 03/09/22 1810 03/09/22 2307 03/10/22 0506 03/11/22 0500  NA 139   < > 137   < > 143 140 138 137 138 141  K 3.5   < > 2.9*   < > 3.5 4.1 5.0 5.4* 4.9 3.8  CL 103   < > 107  --  118* 117*  --  110 105 108  CO2 26   < > 20*  --  18* 21*  --  23 21* 27  GLUCOSE 100*   < > 165*  --  81 75  --  131* 162* 106*  BUN 10   < > 7  --  5* 7  --  10 9 9   CREATININE 0.91   < > 0.72  --  0.50 0.67  --  1.04* 0.96 0.72  CALCIUM 8.3*   < > 8.4*  --  6.7* 8.1*  --  9.7 9.7 8.9  MG 2.1  --  1.4*  --   --   --   --   --  2.3 2.0  PHOS 2.7  --   --   --   --   --   --   --  2.8 2.9   < > = values in this interval not displayed.    GFR: Estimated Creatinine Clearance: 71.8 mL/min (by C-G formula based on SCr of 0.72 mg/dL). Recent Labs  Lab 03/09/22 0014 03/09/22 0231 03/09/22 0236 03/09/22 1730 03/10/22 0506 03/10/22 0921 03/11/22 0500  WBC  --   --  14.5* 10.1 15.3*  --  13.5*  LATICACIDVEN 2.5* 1.9  --  1.4  --  0.9  --     Liver Function Tests: Recent Labs  Lab  02/19/2022 1108 03/09/22 0236 03/09/22 2307  AST 22 48* 31  ALT 16 43 35  ALKPHOS 78 54 60  BILITOT 1.1 0.7 0.9  PROT 7.0 4.8* 4.9*  ALBUMIN 3.7 2.8* 2.4*    Recent Labs  Lab 03/10/2022 1108  LIPASE 24    No results for input(s): "AMMONIA" in the last  168 hours. ABG    Component Value Date/Time   PHART 7.145 (LL) 03/09/2022 1810   PCO2ART 71.6 (HH) 03/09/2022 1810   PO2ART 144 (H) 03/09/2022 1810   HCO3 24.6 03/09/2022 1810   TCO2 27 03/09/2022 1810   ACIDBASEDEF 5.0 (H) 03/09/2022 1810   O2SAT 98 03/09/2022 1810    Coagulation Profile: No results for input(s): "INR", "PROTIME" in the last 168 hours. Cardiac Enzymes: No results for input(s): "CKTOTAL", "CKMB", "CKMBINDEX", "TROPONINI" in the last 168 hours. HbA1C: Hgb A1c MFr Bld  Date/Time Value Ref Range Status  03/09/2022 02:36 AM 5.6 4.8 - 5.6 % Final    Comment:    (NOTE) Pre diabetes:          5.7%-6.4%  Diabetes:              >6.4%  Glycemic control for   <7.0% adults with diabetes    CBG: Recent Labs  Lab 03/10/22 1101 03/10/22 1506 03/10/22 1925 03/10/22 2317 03/11/22 0322  GLUCAP 138* 125* 122* 104* 108*    Past Medical History:  She,  has a past medical history of Allergic rhinitis, Hyperlipidemia, Hypokalemia, MR (mental retardation), Prader-Willi syndrome, Sleep apnea, and Thyroid disease.  Surgical History:   Past Surgical History:  Procedure Laterality Date   ESOPHAGOGASTRODUODENOSCOPY (EGD) WITH PROPOFOL N/A 02/25/2018   Procedure: ESOPHAGOGASTRODUODENOSCOPY (EGD) WITH PROPOFOL;  Surgeon: Kathi Der, MD;  Location: [facility] ENDOSCOPY;  Service: Gastroenterology;  Laterality: N/A;   HERNIA REPAIR     HIATAL HERNIA REPAIR N/A 02/27/2018   Procedure: LAPAROSCOPIC REPAIR OF HIATAL HERNIA;  Surgeon: Ovidio Kin, MD;  Location: [facility] OR;  Service: General;  Laterality: N/A;   LAPAROSCOPIC GASTROSTOMY N/A 02/27/2018   Procedure: LAPAROSCOPIC GASTROSTOMY;  Surgeon: Ovidio Kin, MD;  Location: [facility]  OR;  Service: General;  Laterality: N/A;   THYROIDECTOMY     XI ROBOTIC ASSISTED HIATAL HERNIA REPAIR N/A 2022-03-23   Procedure: XI ROBOTIC ASSISTED RECURRENT DIAPHRAGMATIC HERNIA REPAIR;  Surgeon: Karie Soda, MD;  Location: [facility] OR;  Service: General;  Laterality: N/A;    Social History:   reports that she has never smoked. She has never used smokeless tobacco. She reports that she does not drink alcohol and does not use drugs.  Family History:  Her family history is not on file.  Allergies Allergies  Allergen Reactions   Codeine Other (See Comments)    Unknown reaction   Penicillins Other (See Comments)    Unknown reaction   Tape Other (See Comments)    "surgical tape" - unknown reaction    Home Medications  Prior to Admission medications   Medication Sig Start Date End Date Taking? Authorizing Provider  albuterol (VENTOLIN HFA) 108 (90 Base) MCG/ACT inhaler Inhale 2 puffs into the lungs every 6 (six) hours as needed for wheezing or shortness of breath.   Yes [provider]  ALPRAZolam (XANAX) 0.25 MG tablet Take 0.25 mg by mouth every 12 (twelve) hours.   Yes [provider]  atorvastatin (LIPITOR) 40 MG tablet Take 1 tablet (40 mg total) by mouth daily at 6 PM. 03/06/18  Yes Riccio, Angela C, DO  busPIRone (BUSPAR) 15 MG tablet Take 15 mg by mouth 2 (two) times daily.   Yes [provider]  busPIRone (BUSPAR) 15 MG tablet Take 15 mg by mouth 3 (three) times daily.   Yes [provider]  Cholecalciferol (VITAMIN D3) 50 MCG (2000 UT) capsule Take 2,000 Units by mouth daily.   Yes [provider]  citalopram (CELEXA) 40 MG tablet Take 40 mg by mouth daily.   Yes [provider]  docusate sodium (COLACE) 100 MG capsule Take 100 mg by mouth every evening.   Yes [provider]  fluticasone (FLONASE) 50 MCG/ACT nasal spray Place 1 spray into both nostrils every evening.    Yes [provider]  furosemide (LASIX) 40  MG tablet Take 40 mg by mouth daily.   Yes [provider]  glucosamine-chondroitin 500-400 MG tablet Take 1 tablet by mouth every morning.   Yes [provider]  levothyroxine (SYNTHROID, LEVOTHROID) 137 MCG tablet Take 137 mcg by mouth daily before breakfast.   Yes [provider]  loratadine (CLARITIN) 10 MG tablet Take 10 mg by mouth every morning.   Yes [provider]  Multiple Vitamin (MULTIVITAMIN WITH MINERALS) TABS tablet Take 1 tablet by mouth every morning.   Yes [provider]  potassium chloride (K-DUR) 10 MEQ tablet Take 10 mEq by mouth every morning.   Yes [provider]  PSYLLIUM FIBER PO Take 1 tablet by mouth in the morning and at bedtime.   Yes [provider]  ramipril (ALTACE) 10 MG capsule Take 10 mg by mouth daily.   Yes [provider]  Zinc Oxide (DESITIN RAPID RELIEF) 13 % CREA Apply 1 Application topically daily as needed (Apply to buttock and genital area after using restroom).   Yes [provider]    Gwenevere Abbot, MD Eligha Bridegroom. [facility], PGY-2

## 2022-03-11 NOTE — Progress Notes (Signed)
3 Days Post-Op  Subjective: Looks great.  Mom at bedside.  Having BMs.  No NGT output.   Objective: Vital signs in last 24 hours: Temp:  [97.7 F (36.5 C)-99.1 F (37.3 C)] 97.7 F (36.5 C) (07/21 0700) Pulse Rate:  [98-112] 101 (07/21 0700) Resp:  [16-31] 18 (07/21 0700) BP: (86-118)/(46-100) 109/73 (07/21 0700) SpO2:  [94 %-100 %] 99 % (07/21 0700) FiO2 (%):  [3 %] 3 % (07/20 0945) Last BM Date : 03/07/22  Intake/Output from previous day: 07/20 0701 - 07/21 0700 In: 635.1 [I.V.:350.2; NG/GT:180; IV Piggyback:105] Out: 2620 [Urine:2290; Drains:330] Intake/Output this shift: No intake/output data recorded.  PE: Abd: soft, appropriately tender, incisions are c/d/I with gauze and tegaderm, NGT with essentially no output this am.  JP drain bloody, thin output. 330cc yesterday. GU: foley in place with clear yellow output  Lab Results:  Recent Labs    03/10/22 0506 03/11/22 0500  WBC 15.3* 13.5*  HGB 11.2* 9.8*  HCT 34.7* 29.5*  PLT 238 235   BMET Recent Labs    03/10/22 0506 03/11/22 0500  NA 138 141  K 4.9 3.8  CL 105 108  CO2 21* 27  GLUCOSE 162* 106*  BUN 9 9  CREATININE 0.96 0.72  CALCIUM 9.7 8.9   PT/INR No results for input(s): "LABPROT", "INR" in the last 72 hours. CMP     Component Value Date/Time   NA 141 03/11/2022 0500   K 3.8 03/11/2022 0500   CL 108 03/11/2022 0500   CO2 27 03/11/2022 0500   GLUCOSE 106 (H) 03/11/2022 0500   BUN 9 03/11/2022 0500   CREATININE 0.72 03/11/2022 0500   CALCIUM 8.9 03/11/2022 0500   PROT 4.9 (L) 03/09/2022 2307   ALBUMIN 2.4 (L) 03/09/2022 2307   AST 31 03/09/2022 2307   ALT 35 03/09/2022 2307   ALKPHOS 60 03/09/2022 2307   BILITOT 0.9 03/09/2022 2307   GFRNONAA >60 03/11/2022 0500   GFRAA >60 03/04/2018 0427   Lipase     Component Value Date/Time   LIPASE 24 02/24/2022 1108       Studies/Results: ECHOCARDIOGRAM COMPLETE  Result Date: 03/10/2022    ECHOCARDIOGRAM REPORT   Patient Name:    Barbara Ward Heldt Date of Exam: 03/10/2022 Medical Rec #:  [id number]     Height:       56.0 in Accession #:    [id number]    Weight:       175.0 lb Date of Birth:  [birth year]     BSA:          1.677 m Patient Age:    51 years      BP:           113/63 mmHg Patient Gender: F             HR:           98 bpm. Exam Location:  Inpatient Procedure: 2D Echo, Cardiac Doppler, Color Doppler and Intracardiac            Opacification Agent Indications:    Other cardiac sounds  History:        Patient has no prior history of Echocardiogram examinations.                 Signs/Symptoms:Hypotension.  Sonographer:    Cleatis Polka Referring Phys: [id number] Lorin Glass  Sonographer Comments: Pt sitting up, turned towards right side. IMPRESSIONS  1. Left ventricular ejection fraction, by estimation, is  55 to 60%. The left ventricle has normal function. The left ventricle has no regional wall motion abnormalities. Left ventricular diastolic parameters were normal.  2. Right ventricular systolic function is mildly reduced. The right ventricular size is normal.  3. Pericarial effusion measuring up to 0.7 cm. a small pericardial effusion is present. The pericardial effusion is posterior and lateral to the left ventricle. There is no evidence of cardiac tamponade.  4. The mitral valve is normal in structure. No evidence of mitral valve regurgitation. No evidence of mitral stenosis.  5. The aortic valve is tricuspid. Aortic valve regurgitation is not visualized. No aortic stenosis is present. FINDINGS  Left Ventricle: Left ventricular ejection fraction, by estimation, is 55 to 60%. The left ventricle has normal function. The left ventricle has no regional wall motion abnormalities. Definity contrast agent was given IV to delineate the left ventricular  endocardial borders. The left ventricular internal cavity size was normal in size. There is no left ventricular hypertrophy. Left ventricular diastolic parameters were normal. Right Ventricle:  The right ventricular size is normal. No increase in right ventricular wall thickness. Right ventricular systolic function is mildly reduced. Left Atrium: Left atrial size was normal in size. Right Atrium: Right atrial size was normal in size. Pericardium: Pericarial effusion measuring up to 0.7 cm. A small pericardial effusion is present. The pericardial effusion is posterior and lateral to the left ventricle. There is no evidence of cardiac tamponade. Mitral Valve: The mitral valve is normal in structure. No evidence of mitral valve regurgitation. No evidence of mitral valve stenosis. Tricuspid Valve: The tricuspid valve is normal in structure. Tricuspid valve regurgitation is trivial. No evidence of tricuspid stenosis. Aortic Valve: The aortic valve is tricuspid. Aortic valve regurgitation is not visualized. No aortic stenosis is present. Aortic valve peak gradient measures 4.6 mmHg. Pulmonic Valve: The pulmonic valve was normal in structure. Pulmonic valve regurgitation is trivial. No evidence of pulmonic stenosis. Aorta: The aortic root is normal in size and structure. Venous: The inferior vena cava was not well visualized. IAS/Shunts: No atrial level shunt detected by color flow Doppler.  LEFT VENTRICLE PLAX 2D LVIDd:         3.50 cm     Diastology LVIDs:         2.60 cm     LV e' medial:    8.86 cm/s LV PW:         0.90 cm     LV E/e' medial:  7.2 LV IVS:        0.90 cm     LV e' lateral:   9.48 cm/s LVOT diam:     2.00 cm     LV E/e' lateral: 6.7 LV SV:         45 LV SV Index:   27 LVOT Area:     3.14 cm  LV Volumes (MOD) LV vol d, MOD A2C: 73.4 ml LV vol d, MOD A4C: 73.3 ml LV vol s, MOD A2C: 25.2 ml LV vol s, MOD A4C: 24.0 ml LV SV MOD A2C:     48.2 ml LV SV MOD A4C:     73.3 ml LV SV MOD BP:      50.9 ml RIGHT VENTRICLE RV Basal diam:  2.50 cm RV Mid diam:    2.90 cm RV S prime:     12.40 cm/s TAPSE (M-mode): 2.1 cm LEFT ATRIUM             Index  RIGHT ATRIUM          Index LA diam:        3.50 cm  2.09 cm/m   RA Area:     7.07 cm LA Vol (A2C):   33.2 ml 19.79 ml/m  RA Volume:   11.00 ml 6.56 ml/m LA Vol (A4C):   29.6 ml 17.65 ml/m LA Biplane Vol: 32.1 ml 19.14 ml/m  AORTIC VALVE AV Area (Vmax): 2.57 cm AV Vmax:        107.00 cm/s AV Peak Grad:   4.6 mmHg LVOT Vmax:      87.50 cm/s LVOT Vmean:     61.800 cm/s LVOT VTI:       0.143 m  AORTA Ao Root diam: 2.90 cm Ao Asc diam:  2.50 cm MITRAL VALVE               TRICUSPID VALVE MV Area (PHT): 5.54 cm    TR Peak grad:   22.3 mmHg MV Decel Time: 137 msec    TR Vmax:        236.00 cm/s MV E velocity: 63.40 cm/s MV A velocity: 36.60 cm/s  SHUNTS MV E/A ratio:  1.73        Systemic VTI:  0.14 m                            Systemic Diam: 2.00 cm Chilton Si MD Electronically signed by Chilton Si MD Signature Date/Time: 03/10/2022/1:51:15 PM    Final     Anti-infectives: Anti-infectives (From admission, onward)    Start     Dose/Rate Route Frequency Ordered Stop   02/25/2022 1230  vancomycin (VANCOREADY) IVPB 1500 mg/300 mL        1,500 mg 150 mL/hr over 120 Minutes Intravenous On call to O.R. 03/07/22 2150 02/27/2022 1425   03/10/2022 0600  gentamicin (GARAMYCIN) 300 mg in dextrose 5 % 100 mL IVPB       See Hyperspace for full Linked Orders Report.   300 mg 107.5 mL/hr over 60 Minutes Intravenous On call to O.R. 03/07/22 1321 02/21/2022 1430   02/22/2022 0600  Vancomycin (VANCOCIN) 1,500 mg in sodium chloride 0.9 % 500 mL IVPB  Status:  Discontinued        1,500 mg 250 mL/hr over 120 Minutes Intravenous  Once 03/07/22 1424 03/07/22 2154   03/07/22 1415  Vancomycin (VANCOCIN) 1,500 mg in sodium chloride 0.9 % 500 mL IVPB  Status:  Discontinued       See Hyperspace for full Linked Orders Report.   1,500 mg 250 mL/hr over 120 Minutes Intravenous  Once 03/07/22 1321 03/07/22 1425        Assessment/Plan POD 3, s/p  ROBOTIC RECURRENT ANTERIOR DIAPHRAGMATIC HERNIA REPAIR WITH MESH ROBOTIC LYSIS OF ADHESIONS by Dr. Michaell Cowing 7/18 -clamp NGT today  x 24 hrs and give clear liquids.  If tolerates will DC NGT tomorrow -cont JP Drain -WBC down to 13K -mobilize, therapies -IS, pulm toilet -DC foley today -ok for tx out of ICU from surgical standpoint  FEN - CLD/NGT clamped VTE - Lovenox ID - none currently needed  Prader-Willi syndrome Acute hypoxic post op respiratory failure - resolved MR HLD OSA    LOS: 5 days    Letha Cape , [facility] 03/11/2022, 8:23 AM Please see Amion for pager number during day hours 7:00am-4:30pm or 7:00am -11:30am on weekends

## 2022-03-12 DIAGNOSIS — Q790 Congenital diaphragmatic hernia: Secondary | ICD-10-CM | POA: Diagnosis not present

## 2022-03-12 LAB — BASIC METABOLIC PANEL
Anion gap: 5 (ref 5–15)
BUN: 9 mg/dL (ref 6–20)
CO2: 27 mmol/L (ref 22–32)
Calcium: 8.7 mg/dL — ABNORMAL LOW (ref 8.9–10.3)
Chloride: 105 mmol/L (ref 98–111)
Creatinine, Ser: 0.58 mg/dL (ref 0.44–1.00)
GFR, Estimated: >60 mL/min (ref >60)
Glucose, Bld: 108 mg/dL — ABNORMAL HIGH (ref 70–99)
Potassium: 3.6 mmol/L (ref 3.5–5.1)
Sodium: 137 mmol/L (ref 135–145)

## 2022-03-12 LAB — CBC
HCT: 31 % — ABNORMAL LOW (ref 36.0–46.0)
Hemoglobin: 10.2 g/dL — ABNORMAL LOW (ref 12.0–15.0)
MCH: 31.1 pg (ref 26.0–34.0)
MCHC: 32.9 g/dL (ref 30.0–36.0)
MCV: 94.5 fL (ref 80.0–100.0)
Platelets: 297 10*3/uL (ref 150–400)
RBC: 3.28 MIL/uL — ABNORMAL LOW (ref 3.87–5.11)
RDW: 13.6 % (ref 11.5–15.5)
WBC: 14.3 10*3/uL — ABNORMAL HIGH (ref 4.0–10.5)
nRBC: 0 % (ref 0.0–0.2)

## 2022-03-12 LAB — GLUCOSE, CAPILLARY
Glucose-Capillary: 110 mg/dL — ABNORMAL HIGH (ref 70–99)
Glucose-Capillary: 100 mg/dL — ABNORMAL HIGH (ref 70–99)
Glucose-Capillary: 112 mg/dL — ABNORMAL HIGH (ref 70–99)

## 2022-03-12 MED ORDER — POTASSIUM CHLORIDE 20 MEQ PO PACK
40.0000 meq | PACK | Freq: Once | ORAL | Status: AC
  Administered 2022-03-12: 40 meq
  Filled 2022-03-12: qty 2

## 2022-03-12 NOTE — Progress Notes (Signed)
Physical Therapy Treatment Patient Details Name: Barbara Ward MRN: [id number] DOB: 06/21/1971 Today's Date: 03/12/2022   History of Present Illness pt is a 51 y/o female admitted 7/15 for abdominal pain due to a very large diaphramatic hernia with evidence of SBO.  Hernia repair 7/18 and left intubated  7/19  extubated.  PMHx:  MR, Prader-Willi syndrome    PT Comments    Pt progressing slowly.  Emphasis on warm up, transition to EOB, sitting balance, sit to stand in the STEDY x2 and transfer via STEDY to recliner.    Recommendations for follow up therapy are one component of a multi-disciplinary discharge planning process, led by the attending physician.  Recommendations may be updated based on patient status, additional functional criteria and insurance authorization.  Follow Up Recommendations  Skilled nursing-short term rehab (<3 hours/day) Can patient physically be transported by private vehicle: No   Assistance Recommended at Discharge Frequent or constant Supervision/Assistance  Patient can return home with the following A little help with walking and/or transfers;A little help with bathing/dressing/bathroom;Assistance with cooking/housework;Assist for transportation;Help with stairs or ramp for entrance   Equipment Recommendations  Other (comment) (TBD)    Recommendations for Other Services       Precautions / Restrictions Precautions Precautions: Fall     Mobility  Bed Mobility Overal bed mobility: Needs Assistance Bed Mobility: Supine to Sit     Supine to sit: Mod assist, Max assist, HOB elevated     General bed mobility comments: pt wanting to do it by herself, but takes time to initiate then starts up, but unable to continue without mod assist.  max with pad for scooting to EOB    Transfers Overall transfer level: Needs assistance Equipment used: Ambulation equipment used Transfers: Sit to/from Stand Sit to Stand: Mod assist, +2 physical assistance            General transfer comment: use of the STEDY for sit to stand with assist to help pt come forward and boost pulling on the stedy.    Ambulation/Gait               General Gait Details: NT   Stairs             Wheelchair Mobility    Modified Rankin (Stroke Patients Only)       Balance Overall balance assessment: Needs assistance Sitting-balance support: Feet supported, Feet unsupported, Single extremity supported, Bilateral upper extremity supported Sitting balance-[name]: Poor Sitting balance - Comments: reliant on her UE's and/or external support   Standing balance support: Bilateral upper extremity supported, During functional activity Standing balance-[name]: Poor Standing balance comment: reliant on external support                            Cognition Arousal/Alertness: Awake/alert Behavior During Therapy: WFL for tasks assessed/performed Overall Cognitive Status: History of cognitive impairments - at baseline                                          Exercises Other Exercises Other Exercises: hip/knee flex/ext ROM  graded active assist/resistance x 5 reps bil    General Comments General comments (skin integrity, edema, etc.): vss on RA  HR in the 100's,   SpO2 in the low 90's      Pertinent Vitals/Pain Pain Assessment Faces Pain Scale:  Hurts a little bit Pain Location: general with ROM Pain Descriptors / Indicators: Grimacing Pain Intervention(s): Monitored during session    Home Living                          Prior Function            PT Goals (current goals can now be found in the care plan section) Acute Rehab PT Goals Patient Stated Goal: be able to do myself PT Goal Formulation: Patient unable to participate in goal setting Time For Goal Achievement: 2022/03/27 Potential to Achieve Goals: Good Progress towards PT goals: Progressing toward goals    Frequency    Min  3X/week      PT Plan Current plan remains appropriate    Co-evaluation              AM-PAC PT "6 Clicks" Mobility   Outcome Measure  Help needed turning from your back to your side while in a flat bed without using bedrails?: A Lot Help needed moving from lying on your back to sitting on the side of a flat bed without using bedrails?: A Lot Help needed moving to and from a bed to a chair (including a wheelchair)?: Total Help needed standing up from a chair using your arms (e.g., wheelchair or bedside chair)?: Total Help needed to walk in hospital room?: Total Help needed climbing 3-5 steps with a railing? : Total 6 Click Score: 8    End of Session   Activity Tolerance: Patient tolerated treatment well;Patient limited by fatigue Patient left: in chair;with call bell/phone within reach Nurse Communication: Mobility status PT Visit Diagnosis: Other abnormalities of gait and mobility (R26.89);Muscle weakness (generalized) (M62.81)     Time: 1194-1740 PT Time Calculation (min) (ACUTE ONLY): 29 min  Charges:  $Therapeutic Activity: 23-37 mins                     03/12/2022  Jacinto Halim., PT Acute Rehabilitation Services [phone]  (pager) [phone]  (office)   Eliseo Gum Chaunda Vandergriff 03/12/2022, 12:51 PM

## 2022-03-12 NOTE — Progress Notes (Signed)
CCM signed off 

## 2022-03-12 NOTE — Progress Notes (Addendum)
Transfer summary: Ms. Annalisse Minkoff is a 51 year old female with Prader-Willi syndrome, OSA, hypothyroidism, hyperlipidemia and prior abdominal hernia in 2019 who patient presented with abdominal pain on 7/15 found to have a large diaphragmatic hernia with evidence of SBO.  NG tube was placed and general surgery initially recommended initial medical management.  Patient ultimately went for surgical repair on 7/18 and subsequently had an episode of hypotension and elevated end-tidal CO2, therefore it was decided not to extubate immediately postop and patient was transferred to the ICU.  Patient required pressor support.  Was successfully extubated on 7/19.  Subjective: No acute overnight events.  Patient reports feeling well this morning.  States that she tolerated clear liquids well and is hoping to advance her diet today.  States she is still having some pain mostly at the surgical sites.  She endorses several loose bowel movements.  States at her group home she is able to do all of her ADLs independently.  Objective:  Vital signs in last 24 hours: Vitals:   03/12/22 0323 03/12/22 0400 03/12/22 0500 03/12/22 0600  BP:  117/78 119/81 107/87  Pulse:  91 86 86  Resp:  (!) 25 (!) 23 (!) 23  Temp: 98.2 F (36.8 C)     TempSrc: Oral     SpO2:  91% 91% 92%  Weight:      Height:       Physical Exam General: alert, resting comfortably in bed, in no acute distress HEENT: Normocephalic, atraumatic, EOM intact, conjunctiva normal CV: Regular rate and rhythm, no murmurs rubs or gallops Pulm: Clear to auscultation bilaterally, normal work of breathing Abdomen: Soft, nondistended, diminished bowel sounds appreciated in the left upper quadrant, slight tenderness to palpation near surgical sites, right upper quadrant drain with serosanguineous drainage, NG tube in place with no output MSK: No lower extremity edema Skin: Warm and dry, left IJ CVC in place Neuro: Alert and oriented x3    Assessment/Plan:  Principal Problem:   Diapragmatic hernia of M[place]agni - recurrent & incarcerated - s/p robotic repair w mesh 03/10/2022 Active Problems:   Prader-Willi syndrome   Hypothyroidism   Colonic obstruction s/p reduction of incarcertaed diaphragmatic hernia 02/28/2022   Diaphragmatic hernia - anterior recurrent with obstruction   Hypotension   Respiratory insufficiency   Hypokalemia   Hypomagnesemia  Ms. Andersyn Fragoso is a 51 year old female with Prader-Willi syndrome, OSA, hypothyroidism, hyperlipidemia and prior abdominal hernia in 2019 who patient presented with abdominal pain on 7/15 found to have a large diaphragmatic hernia with evidence of SBO status post surgical repair on 7/18.  Patient had surgical complications of hypotension and end-tidal CO2 changes requiring ICU transfer with intubation and pressor support.  Successfully extubated on 7/19.  Large diaphragmatic hernia with SBO s/p surgical repair 7/18 Operative report made note of adhesions to the mediastinum, left and right pleura now status post repair.  Patient has a JP drain in the right pleural space.  Patient started on clear liquids yesterday, can likely advance today pending surgical recommendations. NG tube clamped yesterday; no output reported, can hopefully be removed today.  Plan to remove JP drain once output is low; output ~570 cc over the past 24 hours.  Leukocyte count stable at 14.  Patient has had several loose bowel movements. -General surgery following, appreciate assistance -Oxycodone for pain control -Monitor output of JP drain -Trend CBC -Continue pulmonary hygiene -Continue PT  Normocytic anemia Hemoglobin 10.2.  No significant blood loss mentioned in the operative report.  May be post surgical.  -Continue to monitor  Hypertension Pressures are normotensive -Continue to hold ramipril  Hyperlipidemia -Continue home atorvastatin  Hypothyroidism -Continue levothyroxine  Vitamin D  deficiency -Continue cholecalciferol  Lymphedema -Continue Lasix 40 mg daily  Anxiety -Continue home medications alprazolam, BuSpar, and citalopram  Diet: Clear liquid diet VTE prophylaxis: Lovenox Full code  Prior to Admission Living Arrangement: Lives in a group home since 2011 Anticipated Discharge Location: SNF Barriers to Discharge: Clinical improvement Dispo: Anticipated discharge pending clinical improvement  Stavroula Rohde N, DO 03/12/2022, 7:06 AM After 5pm on weekdays and 1pm on weekends: On Call pager [phone]

## 2022-03-12 NOTE — Progress Notes (Signed)
[facility] ADULT ICU REPLACEMENT PROTOCOL   The patient does apply for the [facility] Adult ICU Electrolyte Replacment Protocol based on the criteria listed below:   1.Exclusion criteria: TCTS patients, ECMO patients, and Dialysis patients 2. Is GFR >/= 30 ml/min? Yes.    Patient's GFR today is >60 3. Is SCr </= 2? Yes.   Patient's SCr is 0.58 mg/dL 4. Did SCr increase >/= 0.5 in 24 hours? Yes.   5.Pt's weight >40kg  Yes.   6. Abnormal electrolyte(s): K+ 3.6  7. Electrolytes replaced per protocol 8.  Call MD STAT for K+ </= 2.5, Phos </= 1, or Mag </= 1 Physician:  n/a  Melvern Banker 03/12/2022 5:55 AM

## 2022-03-12 NOTE — Progress Notes (Signed)
4 Days Post-Op  Subjective: Looks great.  Tolerating CLD with no issues.  No nausea.  Voiding well without foley.   Objective: Vital signs in last 24 hours: Temp:  [97.5 F (36.4 C)-98.9 F (37.2 C)] 97.5 F (36.4 C) (07/22 0700) Pulse Rate:  [86-114] 86 (07/22 0600) Resp:  [0-30] 23 (07/22 0600) BP: (101-119)/(63-87) 107/87 (07/22 0600) SpO2:  [89 %-100 %] 92 % (07/22 0600) Last BM Date : 03/11/22  Intake/Output from previous day: 07/21 0701 - 07/22 0700 In: 1050 [P.O.:1030; I.V.:20] Out: 1720 [Urine:1150; Drains:570] Intake/Output this shift: No intake/output data recorded.  PE: Abd: soft, appropriately tender, incisions are c/d/I with gauze and tegaderm, NGT clamped  JP drain serosang output, 550cc   Lab Results:  Recent Labs    03/11/22 0500 03/12/22 0425  WBC 13.5* 14.3*  HGB 9.8* 10.2*  HCT 29.5* 31.0*  PLT 235 297   BMET Recent Labs    03/11/22 0500 03/12/22 0425  NA 141 137  K 3.8 3.6  CL 108 105  CO2 27 27  GLUCOSE 106* 108*  BUN 9 9  CREATININE 0.72 0.58  CALCIUM 8.9 8.7*   PT/INR No results for input(s): "LABPROT", "INR" in the last 72 hours. CMP     Component Value Date/Time   NA 137 03/12/2022 0425   K 3.6 03/12/2022 0425   CL 105 03/12/2022 0425   CO2 27 03/12/2022 0425   GLUCOSE 108 (H) 03/12/2022 0425   BUN 9 03/12/2022 0425   CREATININE 0.58 03/12/2022 0425   CALCIUM 8.7 (L) 03/12/2022 0425   PROT 4.9 (L) 03/09/2022 2307   ALBUMIN 2.4 (L) 03/09/2022 2307   AST 31 03/09/2022 2307   ALT 35 03/09/2022 2307   ALKPHOS 60 03/09/2022 2307   BILITOT 0.9 03/09/2022 2307   GFRNONAA >60 03/12/2022 0425   GFRAA >60 03/04/2018 0427   Lipase     Component Value Date/Time   LIPASE 24 03/13/2022 1108       Studies/Results: DG CHEST PORT 1 VIEW  Result Date: 03/11/2022 CLINICAL DATA:  Status post diaphragmatic hernia repair. EXAM: PORTABLE CHEST 1 VIEW COMPARISON:  03/09/2022 FINDINGS: Left jugular central line in stable  position with the catheter tip in the distal SVC. Nasogastric tube in stable position with the tip in the stomach. Surgical drain in stable positioning, possibly slightly retracted with the tip in the right pleural space. No pneumothorax visualized. Low bilateral lung volumes with bilateral lower lobe atelectasis. No overt edema or significant pleural effusions. Further decrease in diffuse subcutaneous emphysema of the chest wall and neck compared to the prior chest x-ray. IMPRESSION: 1. Central lines and drains in stable position. The right-sided surgical drain may be very slightly retracted. 2. Low bilateral lung volumes with bilateral lower lobe atelectasis. 3. Further decrease in diffuse subcutaneous emphysema of the chest wall and lower neck. Electronically Signed   By: Irish Lack M.D.   On: 03/11/2022 09:05   ECHOCARDIOGRAM COMPLETE  Result Date: 03/10/2022    ECHOCARDIOGRAM REPORT   Patient Name:   Barbara Ward Date of Exam: 03/10/2022 Medical Rec #:  [id number]     Height:       56.0 in Accession #:    [id number]    Weight:       175.0 lb Date of Birth:  [birth year]     BSA:          1.677 m Patient Age:    51 years  BP:           113/63 mmHg Patient Gender: F             HR:           98 bpm. Exam Location:  Inpatient Procedure: 2D Echo, Cardiac Doppler, Color Doppler and Intracardiac            Opacification Agent Indications:    Other cardiac sounds  History:        Patient has no prior history of Echocardiogram examinations.                 Signs/Symptoms:Hypotension.  Sonographer:    Cleatis Polka Referring Phys: [id number] Lorin Glass  Sonographer Comments: Pt sitting up, turned towards right side. IMPRESSIONS  1. Left ventricular ejection fraction, by estimation, is 55 to 60%. The left ventricle has normal function. The left ventricle has no regional wall motion abnormalities. Left ventricular diastolic parameters were normal.  2. Right ventricular systolic function is mildly reduced. The  right ventricular size is normal.  3. Pericarial effusion measuring up to 0.7 cm. a small pericardial effusion is present. The pericardial effusion is posterior and lateral to the left ventricle. There is no evidence of cardiac tamponade.  4. The mitral valve is normal in structure. No evidence of mitral valve regurgitation. No evidence of mitral stenosis.  5. The aortic valve is tricuspid. Aortic valve regurgitation is not visualized. No aortic stenosis is present. FINDINGS  Left Ventricle: Left ventricular ejection fraction, by estimation, is 55 to 60%. The left ventricle has normal function. The left ventricle has no regional wall motion abnormalities. Definity contrast agent was given IV to delineate the left ventricular  endocardial borders. The left ventricular internal cavity size was normal in size. There is no left ventricular hypertrophy. Left ventricular diastolic parameters were normal. Right Ventricle: The right ventricular size is normal. No increase in right ventricular wall thickness. Right ventricular systolic function is mildly reduced. Left Atrium: Left atrial size was normal in size. Right Atrium: Right atrial size was normal in size. Pericardium: Pericarial effusion measuring up to 0.7 cm. A small pericardial effusion is present. The pericardial effusion is posterior and lateral to the left ventricle. There is no evidence of cardiac tamponade. Mitral Valve: The mitral valve is normal in structure. No evidence of mitral valve regurgitation. No evidence of mitral valve stenosis. Tricuspid Valve: The tricuspid valve is normal in structure. Tricuspid valve regurgitation is trivial. No evidence of tricuspid stenosis. Aortic Valve: The aortic valve is tricuspid. Aortic valve regurgitation is not visualized. No aortic stenosis is present. Aortic valve peak gradient measures 4.6 mmHg. Pulmonic Valve: The pulmonic valve was normal in structure. Pulmonic valve regurgitation is trivial. No evidence of  pulmonic stenosis. Aorta: The aortic root is normal in size and structure. Venous: The inferior vena cava was not well visualized. IAS/Shunts: No atrial level shunt detected by color flow Doppler.  LEFT VENTRICLE PLAX 2D LVIDd:         3.50 cm     Diastology LVIDs:         2.60 cm     LV e' medial:    8.86 cm/s LV PW:         0.90 cm     LV E/e' medial:  7.2 LV IVS:        0.90 cm     LV e' lateral:   9.48 cm/s LVOT diam:     2.00 cm  LV E/e' lateral: 6.7 LV SV:         45 LV SV Index:   27 LVOT Area:     3.14 cm  LV Volumes (MOD) LV vol d, MOD A2C: 73.4 ml LV vol d, MOD A4C: 73.3 ml LV vol s, MOD A2C: 25.2 ml LV vol s, MOD A4C: 24.0 ml LV SV MOD A2C:     48.2 ml LV SV MOD A4C:     73.3 ml LV SV MOD BP:      50.9 ml RIGHT VENTRICLE RV Basal diam:  2.50 cm RV Mid diam:    2.90 cm RV S prime:     12.40 cm/s TAPSE (M-mode): 2.1 cm LEFT ATRIUM             Index        RIGHT ATRIUM          Index LA diam:        3.50 cm 2.09 cm/m   RA Area:     7.07 cm LA Vol (A2C):   33.2 ml 19.79 ml/m  RA Volume:   11.00 ml 6.56 ml/m LA Vol (A4C):   29.6 ml 17.65 ml/m LA Biplane Vol: 32.1 ml 19.14 ml/m  AORTIC VALVE AV Area (Vmax): 2.57 cm AV Vmax:        107.00 cm/s AV Peak Grad:   4.6 mmHg LVOT Vmax:      87.50 cm/s LVOT Vmean:     61.800 cm/s LVOT VTI:       0.143 m  AORTA Ao Root diam: 2.90 cm Ao Asc diam:  2.50 cm MITRAL VALVE               TRICUSPID VALVE MV Area (PHT): 5.54 cm    TR Peak grad:   22.3 mmHg MV Decel Time: 137 msec    TR Vmax:        236.00 cm/s MV E velocity: 63.40 cm/s MV A velocity: 36.60 cm/s  SHUNTS MV E/A ratio:  1.73        Systemic VTI:  0.14 m                            Systemic Diam: 2.00 cm Chilton Si MD Electronically signed by Chilton Si MD Signature Date/Time: 03/10/2022/1:51:15 PM    Final     Anti-infectives: Anti-infectives (From admission, onward)    Start     Dose/Rate Route Frequency Ordered Stop   2022/03/26 1230  vancomycin (VANCOREADY) IVPB 1500 mg/300 mL         1,500 mg 150 mL/hr over 120 Minutes Intravenous On call to O.R. 03/07/22 2150 March 26, 2022 1425   2022-03-26 0600  gentamicin (GARAMYCIN) 300 mg in dextrose 5 % 100 mL IVPB       See Hyperspace for full Linked Orders Report.   300 mg 107.5 mL/hr over 60 Minutes Intravenous On call to O.R. 03/07/22 1321 2022/03/26 1430   Mar 26, 2022 0600  Vancomycin (VANCOCIN) 1,500 mg in sodium chloride 0.9 % 500 mL IVPB  Status:  Discontinued        1,500 mg 250 mL/hr over 120 Minutes Intravenous  Once 03/07/22 1424 03/07/22 2154   03/07/22 1415  Vancomycin (VANCOCIN) 1,500 mg in sodium chloride 0.9 % 500 mL IVPB  Status:  Discontinued       See Hyperspace for full Linked Orders Report.   1,500 mg 250 mL/hr over 120 Minutes Intravenous  Once 03/07/22  1321 03/07/22 1425        Assessment/Plan POD 4, s/p  ROBOTIC RECURRENT ANTERIOR DIAPHRAGMATIC HERNIA REPAIR WITH MESH ROBOTIC LYSIS OF ADHESIONS by Dr. Michaell Cowing 7/18 -DC NGT, adv to FLD -cont JP Drain -WBC 14K -mobilize, therapies -IS, pulm toilet -awaiting floor bed  FEN - FLD/IVFs VTE - Lovenox ID - none currently needed  Prader-Willi syndrome Acute hypoxic post op respiratory failure - resolved MR HLD OSA    LOS: 6 days    Letha Cape , [facility] Surgery 03/12/2022, 8:28 AM Please see Amion for pager number during day hours 7:00am-4:30pm or 7:00am -11:30am on weekends

## 2022-03-13 DIAGNOSIS — Q790 Congenital diaphragmatic hernia: Secondary | ICD-10-CM | POA: Diagnosis not present

## 2022-03-13 LAB — BASIC METABOLIC PANEL WITH GFR
Anion gap: 6 (ref 5–15)
BUN: 11 mg/dL (ref 6–20)
CO2: 27 mmol/L (ref 22–32)
Calcium: 8.9 mg/dL (ref 8.9–10.3)
Chloride: 103 mmol/L (ref 98–111)
Creatinine, Ser: 0.66 mg/dL (ref 0.44–1.00)
GFR, Estimated: >60 mL/min
Glucose, Bld: 173 mg/dL — ABNORMAL HIGH (ref 70–99)
Potassium: 3.9 mmol/L (ref 3.5–5.1)
Sodium: 136 mmol/L (ref 135–145)

## 2022-03-13 LAB — CBC
HCT: 32.3 % — ABNORMAL LOW (ref 36.0–46.0)
Hemoglobin: 10.6 g/dL — ABNORMAL LOW (ref 12.0–15.0)
MCH: 30.7 pg (ref 26.0–34.0)
MCHC: 32.8 g/dL (ref 30.0–36.0)
MCV: 93.6 fL (ref 80.0–100.0)
Platelets: 370 K/uL (ref 150–400)
RBC: 3.45 MIL/uL — ABNORMAL LOW (ref 3.87–5.11)
RDW: 13.9 % (ref 11.5–15.5)
WBC: 14 K/uL — ABNORMAL HIGH (ref 4.0–10.5)
nRBC: 0 % (ref 0.0–0.2)

## 2022-03-13 MED ORDER — LACTATED RINGERS IV BOLUS
500.0000 mL | Freq: Once | INTRAVENOUS | Status: AC
Start: 2022-03-13 — End: 2022-03-13
  Administered 2022-03-13: 500 mL via INTRAVENOUS

## 2022-03-13 NOTE — Progress Notes (Signed)
Subjective:   Hospital day: 8  Overnight event: No acute events overnight  Interim History: Evaluated patient at the bedside with family in the room. States she needs to use the restroom and waiting for the nurse. She has mild stomach discomfort but it is not bothersome. Denies any nausea, vomiting, shortness of breath or chest pain.  Objective:  Vital signs in last 24 hours: Vitals:   03/12/22 1600 03/12/22 2036 03/12/22 2351 03/13/22 0349  BP: (!) 110/54 96/66 95/70  102/65  Pulse: 98 (!) 112 100 (!) 102  Resp:  20 20 20   Temp:  98 F (36.7 C) 97.8 F (36.6 C) 97.8 F (36.6 C)  TempSrc:  Oral Oral Oral  SpO2: 92% 94% 96% 96%  Weight:      Height:        Filed Weights   03/02/2022 1101  Weight: 79.4 kg     Intake/Output Summary (Last 24 hours) at 03/13/2022 0759 Last data filed at 03/13/2022 0446 Gross per 24 hour  Intake 500 ml  Output 1250 ml  Net -750 ml   Net IO Since Admission: 5,717.76 mL [03/13/22 0759]  Recent Labs    03/12/22 0321 03/12/22 0744 03/12/22 1129  GLUCAP 110* 100* 112*     Pertinent Labs:    Latest Ref Rng & Units 03/13/2022    3:20 AM 03/12/2022    4:25 AM 03/11/2022    5:00 AM  CBC  WBC 4.0 - 10.5 K/uL 14.0  14.3  13.5   Hemoglobin 12.0 - 15.0 g/dL 03/13/2022  82.9  9.8   Hematocrit 36.0 - 46.0 % 32.3  31.0  29.5   Platelets 150 - 400 K/uL 370  297  235        Latest Ref Rng & Units 03/13/2022    3:20 AM 03/12/2022    4:25 AM 03/11/2022    5:00 AM  CMP  Glucose 70 - 99 mg/dL 03/13/2022  130  865   BUN 6 - 20 mg/dL 11  9  9    Creatinine 0.44 - 1.00 mg/dL 784   6.96   Sodium 135 - 145 mmol/L 136  137  141   Potassium 3.5 - 5.1 mmol/L 3.9  3.6  3.8   Chloride 98 - 111 mmol/L 103  105  108   CO2 22 - 32 mmol/L 27  27  27    Calcium 8.9 - 10.3 mg/dL 8.9  8.7  8.9     Imaging: No results found.  Physical Exam  General: Pleasant, well-appearing middle-aged woman laying in bed. No acute distress. CV: Mild tachycardia. Regular  rhythm. No m/r/g. No LE edema Pulmonary: On room air. Normal effort. Abdominal: Soft, Mild tenderness. JP drain with serosanguineous output. Abdominal incisions c/d/l.  Normal bowel sounds. Extremities: 2+ distal pulses. Chronic lymphedema in BLE.  Skin: Warm and dry. No obvious rash or lesions. Neuro: A&Ox3. Moves all extremities.    Assessment/Plan: Barbara Ward is a 51 y.o. female with hx of Prader-Willi syndrome, OSA, hypothyroidism, hyperlipidemia and prior abdominal hernia in 2019 who patient presented with abdominal pain on 7/15 found to have a large diaphragmatic hernia with evidence of SBO status post surgical repair on 7/18.  Patient had surgical complications of hypotension and end-tidal CO2 changes requiring ICU transfer with intubation and pressor support.  Successfully extubated on 7/19.  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 S/p repair of adhesions to mediastinum, left and right pleural. NG tube removed yesterday. Patient has been tolerating full liquid diet. She continues to have mild abdominal discomfort on exam today. JP drain with 500 cc of serosanguineous output in the past 24 hours. She remains afebrile, leukocytosis stable. PT/OT recommending SNF placement for acute rehab. -General surgery following, appreciate assistance -Discontinue central line, place peripheral line -PRN oxycodone for pain -Continue JP drain -Mobilize, IS, pulm toilet -Pending SNF placement, appreciate CSW assistance   Normocytic anemia Hemoglobin remained stable at 10.6 today. No evidence of active bleed. -CTM with daily CBC    Hypertension Stable with SBP in the 100s to the 110s. -Continue to hold ramipril   Hyperlipidemia - Continue atorvastatin 40 mg daily   Hypothyroidism TSH 0.474 on admission. - Continue Synthroid 137 mcg daily   Vitamin D deficiency -Continue cholecalciferol   Lymphedema -Continue Lasix 40 mg daily   Anxiety -Continue home medications alprazolam, BuSpar, and citalopram  Diet: Full liquid IVF: N/A VTE: Lovenox CODE: Full code   Prior to Admission Living Arrangement: Lives in a group home since 2011 Anticipated Discharge Location: SNF Barriers to Discharge: Pending SNF  Dispo: Anticipated discharge pending SNF bed or for insurance authorization  Signed: Steffanie Rainwater, MD 03/13/2022, 7:59 AM  Pager: [phone] Internal Medicine Teaching Service After 5pm on weekdays and 1pm on weekends: On Call pager: [phone]

## 2022-03-13 NOTE — TOC Initial Note (Addendum)
Transition of Care [facility]) - Initial/Assessment Note    Patient Details  Name: Barbara Ward MRN: [id number] Date of Birth: 11-10-1970  Transition [facility]) CM/SW Contact:    Barbara Ward, LCSWA Phone Number: 03/13/2022, 12:24 PM  Clinical Narrative:                  CSW received consult for possible SNF placement at time of discharge. Due to patients current orientation CSW spoke with patients mother Barbara Ward patients legal guardian regarding PT recommendation of SNF placement at time of discharge. Patients mother reports patient comes from Group home. Patients mother expressed understanding of PT recommendation and is agreeable to SNF placement for patient at time of discharge. Patients mother gave CSW permission to fax out initial referral near the [place] area. CSW discussed insurance authorization process with patients mother. Patients mother reports patient has received the COVID vaccines as well as 2 boosters. No further questions reported at this time. CSW to continue to follow and assist with discharge planning needs.    Expected Discharge Plan: Skilled Nursing Facility Barriers to Discharge: Continued Medical Work up   Patient Goals and CMS Choice Patient states their goals for this hospitalization and ongoing recovery are:: To return to group home CMS Medicare.gov Compare Post Acute Care list provided to:: Patient Represenative (must comment) (patients mother Barbara Ward) Choice offered to / list presented to : Parent  Expected Discharge Plan and Services Expected Discharge Plan: Skilled Nursing Facility In-house Referral: Clinical Social Work     Living arrangements for the past 2 months: Group Home                                      Prior Living Arrangements/Services Living arrangements for the past 2 months: Group Home Lives with:: Other (Comment) (from Group home) Patient language and need for interpreter reviewed:: Yes Do you feel safe going back to  the place where you live?: No   SNF  Need for Family Participation in Patient Care: Yes (Comment) Care giver support system in place?: Yes (comment)   Criminal Activity/Legal Involvement Pertinent to Current Situation/Hospitalization: No - Comment as needed  Activities of Daily Living Home Assistive Devices/Equipment: Environmental consultant (specify type) (rolator) ADL Screening (condition at time of admission) Patient's cognitive ability adequate to safely complete daily activities?: No Is the patient deaf or have difficulty hearing?: No Does the patient have difficulty seeing, even when wearing glasses/contacts?: No Does the patient have difficulty concentrating, remembering, or making decisions?: No Patient able to express need for assistance with ADLs?: Yes Does the patient have difficulty dressing or bathing?: Yes Independently performs ADLs?: No Communication: Independent Dressing (OT): Needs assistance Is this a change from baseline?: Pre-admission baseline Grooming: Needs assistance Is this a change from baseline?: Pre-admission baseline Feeding: Independent Bathing: Needs assistance Is this a change from baseline?: Pre-admission baseline Toileting: Needs assistance Is this a change from baseline?: Pre-admission baseline In/Out Bed: Needs assistance Is this a change from baseline?: Pre-admission baseline Walks in Home: Needs assistance Is this a change from baseline?: Pre-admission baseline Does the patient have difficulty walking or climbing stairs?: Yes Weakness of Legs: Both Weakness of Arms/Hands: None  Permission Sought/Granted Permission sought to share information with : Case Manager, Family Supports, Magazine features editor Permission granted to share information with : No  Share Information with NAME: Due to patients current orientation spoke with patient mother Barbara Ward  Permission granted to share info w AGENCY: SNF  Permission granted to share info w Relationship:  mother  Permission granted to share info w Contact Information: Barbara Ward [phone]  Emotional Assessment   Attitude/Demeanor/Rapport: Engaged Affect (typically observed): Flat Orientation: : Oriented to Self, Oriented to Place, Oriented to  Time Alcohol / Substance Use: Not Applicable Psych Involvement: No (comment)  Admission diagnosis:  Small bowel obstruction (HCC) [K56.609] Diaphragmatic hernia with obstruction, without gangrene [K44.0] Generalized abdominal pain [R10.84] Patient Active Problem List   Diagnosis Date Noted   Hypokalemia 03/09/2022   Hypomagnesemia 03/09/2022   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 [place] [address] Phone: [phone] Fax: [phone]     Social Determinants of Health (SDOH) Interventions    Readmission Risk Interventions     No data to display

## 2022-03-13 NOTE — [place] FL2 (Signed)
[place] MEDICAID FL2 LEVEL OF CARE SCREENING TOOL     IDENTIFICATION  Patient Name: Barbara Ward Birthdate: January 23, 1971 Sex: female Admission Date (Current Location): 02/24/2022  [facility] and [place] Number:  Herbalist and Address:  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/p robotic repair w mesh 02/21/2022 03/09/2022   Hypotension    Respiratory insufficiency    Diaphragmatic hernia - anterior recurrent with obstruction 03/07/2022   Colonic obstruction s/p reduction of incarcertaed diaphragmatic hernia 03/16/2022 02/24/2022   Prader-Willi syndrome 02/24/2018   Hypothyroidism 02/24/2018   Gastric outlet obstruction    Lymphedema of leg 02/16/2016    Orientation RESPIRATION BLADDER Height & Weight     Self, Place, Time  Normal Incontinent, External catheter (External Urinary Catheter) Weight: 175 lb (79.4 kg) Height:  4\' 8"  (142.2 cm)  BEHAVIORAL SYMPTOMS/MOOD NEUROLOGICAL BOWEL NUTRITION STATUS      Incontinent Diet (Please see discharge summary)  AMBULATORY STATUS COMMUNICATION OF NEEDS Skin   Limited Assist Verbally Other (Comment) (Appropriate for ethnicity,dry,abrasion,abdomen,L,upper,ecchymosis,arm,abdomen,bilateral,non-tenting,Incision closed abdomen,other,gauze,clean,dry,intact,PRN)                       Personal Care Assistance Level of Assistance  Bathing, Feeding, Dressing  Bathing Assistance: Limited assistance Feeding assistance: Limited assistance (Needs assist;Needs set up) Dressing Assistance: Limited assistance     Functional Limitations Info  Sight, Hearing, Speech Sight Info: Adequate Hearing Info: Adequate Speech Info: Adequate    SPECIAL CARE FACTORS FREQUENCY  PT (By licensed PT), OT (By licensed OT)     PT Frequency: 5x min weekly OT Frequency: 5x min weekly            Contractures Contractures Info: Not present    Additional Factors Info  Code Status, Allergies, Psychotropic Code Status Info: FULL Allergies Info: Codeine,Penicillins,Tape Psychotropic Info: ALPRAZolam (XANAX) tablet 0.25 mg 2 times daily,busPIRone (BUSPAR) tablet 15 mg 2 times daily,citalopram (CELEXA) tablet 40 mg daily         Current Medications (03/13/2022):  This is the current hospital active medication list Current Facility-Administered Medications  Medication Dose Route Frequency Provider Last Rate Last Admin   0.9 %  sodium chloride infusion  250 mL Intravenous Continuous Gleason, Otilio Carpen, PA-C   Stopped at 03/10/22 1128   acetaminophen (TYLENOL) tablet 1,000 mg  1,000 mg Oral Q6H Saverio Danker, PA-C   1,000 mg at 03/13/22 1247   albuterol (PROVENTIL) (2.5 MG/3ML) 0.083% nebulizer solution 2.5 mg  2.5 mg Nebulization Q4H PRN Michael Boston, MD       ALPRAZolam Duanne Moron) tablet 0.25 mg  0.25 mg Oral BID Saverio Danker, PA-C   0.25 mg at 03/13/22 1043   alum & mag hydroxide-simeth (MAALOX/MYLANTA) 200-200-20 MG/5ML suspension 30 mL  30 mL Oral Q6H PRN Saverio Danker, PA-C       atorvastatin (LIPITOR) tablet 40 mg  40 mg Oral q1800 Saverio Danker, PA-C  40 mg at 03/12/22 2055   bisacodyl (DULCOLAX) suppository 10 mg  10 mg Rectal Daily PRN Olalere, Adewale A, MD       busPIRone (BUSPAR) tablet 15 mg  15 mg Oral BID Barnetta Chapel, PA-C   15 mg at 03/13/22 1042   Chlorhexidine Gluconate Cloth 2 % PADS 6 each  6 each Topical Daily Lorin Glass, MD   6 each at  03/13/22 0525   cholecalciferol (VITAMIN D3) tablet 2,000 Units  2,000 Units Oral Daily Barnetta Chapel, PA-C   2,000 Units at 03/13/22 1043   citalopram (CELEXA) tablet 40 mg  40 mg Oral Daily Barnetta Chapel, PA-C   40 mg at 03/13/22 1043   enoxaparin (LOVENOX) injection 40 mg  40 mg Subcutaneous Q24H Karie Soda, MD   40 mg at 03/12/22 2056   fentaNYL (SUBLIMAZE) injection 12.5-25 mcg  12.5-25 mcg Intravenous Q2H PRN Karie Soda, MD   25 mcg at 03/12/22 0547   fluticasone (FLONASE) 50 MCG/ACT nasal spray 1 spray  1 spray Each Nare QPM Karie Soda, MD   1 spray at 03/11/22 1723   furosemide (LASIX) tablet 40 mg  40 mg Oral Daily Barnetta Chapel, PA-C   40 mg at 03/13/22 1043   levothyroxine (SYNTHROID) tablet 137 mcg  137 mcg Oral Q0600 Barnetta Chapel, PA-C   137 mcg at 03/13/22 4098   lip balm (CARMEX) ointment   Topical BID Karie Soda, MD   75 Application at 03/12/22 1191   magic mouthwash  15 mL Oral QID PRN Karie Soda, MD       menthol-cetylpyridinium (CEPACOL) lozenge 3 mg  1 lozenge Oral PRN Karie Soda, MD       methocarbamol (ROBAXIN) tablet 500 mg  500 mg Oral TID Barnetta Chapel, PA-C   500 mg at 03/13/22 1043   ondansetron (ZOFRAN) injection 4 mg  4 mg Intravenous Q6H PRN Karie Soda, MD       Or   ondansetron (ZOFRAN) 8 mg in sodium chloride 0.9 % 50 mL IVPB  8 mg Intravenous Q6H PRN Karie Soda, MD       Oral care mouth rinse  15 mL Mouth Rinse PRN Lorin Glass, MD   15 mL at 03/09/22 2000   oxyCODONE (Oxy IR/ROXICODONE) immediate release tablet 5-10 mg  5-10 mg Oral Q4H PRN Barnetta Chapel, PA-C   10 mg at 03/12/22 0945   phenol (CHLORASEPTIC) mouth spray 2 spray  2 spray Mouth/Throat PRN Karie Soda, MD       polyethylene glycol (MIRALAX / GLYCOLAX) packet 17 g  17 g Oral Daily Barnetta Chapel, PA-C   17 g at 03/12/22 2056   prochlorperazine (COMPAZINE) injection 5-10 mg  5-10 mg Intravenous Q4H PRN Karie Soda, MD       senna (SENOKOT) tablet 8.6 mg  1 tablet  Oral BID Olalere, Adewale A, MD   8.6 mg at 03/13/22 1042   simethicone (MYLICON) 40 MG/0.6ML suspension 80 mg  80 mg Oral QID PRN Barnetta Chapel, PA-C       sodium chloride flush (NS) 0.9 % injection 10-40 mL  10-40 mL Intracatheter Q12H Charlotte Sanes, MD   10 mL at 03/12/22 0951   sodium chloride flush (NS) 0.9 % injection 10-40 mL  10-40 mL Intracatheter PRN Charlotte Sanes, MD   10 mL at 03/13/22 0331     Discharge Medications: Please see discharge summary for a list of discharge medications.  Relevant Imaging Results:  Relevant Lab Results:  Additional Information [id number], Both Covid Vaccines 2 boosters  Delilah Shan, [address]

## 2022-03-13 NOTE — Progress Notes (Signed)
A consult was placed to IV Therapy to start a PIV, and remove the central line;  pt assessed and attempted by 2 IV Team RNs; Ultrasound was used;  pt with very poor peripheral access;  suggest keeping the central line for access and lab draws.  RN aware.

## 2022-03-13 NOTE — Progress Notes (Signed)
5 Days Post-Op  Subjective: Looks great.  Tolerating FLD with no issues.  No nausea.  Voiding well without foley.   Objective: Vital signs in last 24 hours: Temp:  [97.8 F (36.6 C)-98.4 F (36.9 C)] 97.8 F (36.6 C) (07/23 0349) Pulse Rate:  [98-115] 107 (07/23 0800) Resp:  [18-24] 20 (07/23 0800) BP: (95-118)/(54-87) 112/74 (07/23 0800) SpO2:  [90 %-96 %] 96 % (07/23 0349) Last BM Date : 03/12/22  Intake/Output from previous day: 07/22 0701 - 07/23 0700 In: 500 [P.O.:500] Out: 1250 [Urine:750; Drains:500] Intake/Output this shift: Total I/O In: -  Out: 80 [Drains:80]  PE: Abd: soft, appropriately tender, incisions are c/d/I with gauze and tegaderm, NGT out,  JP drain serosang output   Lab Results:  Recent Labs    03/12/22 0425 03/13/22 0320  WBC 14.3* 14.0*  HGB 10.2* 10.6*  HCT 31.0* 32.3*  PLT 297 370    BMET Recent Labs    03/12/22 0425 03/13/22 0320  NA 137 136  K 3.6 3.9  CL 105 103  CO2 27 27  GLUCOSE 108* 173*  BUN 9 11  CREATININE 0.58 0.66  CALCIUM 8.7* 8.9    PT/INR No results for input(s): "LABPROT", "INR" in the last 72 hours. CMP     Component Value Date/Time   NA 136 03/13/2022 0320   K 3.9 03/13/2022 0320   CL 103 03/13/2022 0320   CO2 27 03/13/2022 0320   GLUCOSE 173 (H) 03/13/2022 0320   BUN 11 03/13/2022 0320   CREATININE 0.66 03/13/2022 0320   CALCIUM 8.9 03/13/2022 0320   PROT 4.9 (L) 03/09/2022 2307   ALBUMIN 2.4 (L) 03/09/2022 2307   AST 31 03/09/2022 2307   ALT 35 03/09/2022 2307   ALKPHOS 60 03/09/2022 2307   BILITOT 0.9 03/09/2022 2307   GFRNONAA >60 03/13/2022 0320   GFRAA >60 03/04/2018 0427   Lipase     Component Value Date/Time   LIPASE 24 03/03/2022 1108       Studies/Results: No results found.  Anti-infectives: Anti-infectives (From admission, onward)    Start     Dose/Rate Route Frequency Ordered Stop   02/28/2022 1230  vancomycin (VANCOREADY) IVPB 1500 mg/300 mL        1,500 mg 150  mL/hr over 120 Minutes Intravenous On call to O.R. 03/07/22 2150 03/03/2022 1425   02/22/2022 0600  gentamicin (GARAMYCIN) 300 mg in dextrose 5 % 100 mL IVPB       See Hyperspace for full Linked Orders Report.   300 mg 107.5 mL/hr over 60 Minutes Intravenous On call to O.R. 03/07/22 1321 02/27/2022 1430   02/27/2022 0600  Vancomycin (VANCOCIN) 1,500 mg in sodium chloride 0.9 % 500 mL IVPB  Status:  Discontinued        1,500 mg 250 mL/hr over 120 Minutes Intravenous  Once 03/07/22 1424 03/07/22 2154   03/07/22 1415  Vancomycin (VANCOCIN) 1,500 mg in sodium chloride 0.9 % 500 mL IVPB  Status:  Discontinued       See Hyperspace for full Linked Orders Report.   1,500 mg 250 mL/hr over 120 Minutes Intravenous  Once 03/07/22 1321 03/07/22 1425        Assessment/Plan POD 5, s/p  ROBOTIC RECURRENT ANTERIOR DIAPHRAGMATIC HERNIA REPAIR WITH MESH ROBOTIC LYSIS OF ADHESIONS by Dr. Michaell Cowing 7/18 -Cont FLD -cont JP Drain -mobilize, therapies -IS, pulm toilet   FEN - FLD/IVFs VTE - Lovenox ID - none currently needed  Prader-Willi syndrome Acute hypoxic post  op respiratory failure - resolved MR HLD OSA    LOS: 7 days    Vanita Panda, MD  Colorectal and General Surgery [facility]

## 2022-03-14 DIAGNOSIS — Q790 Congenital diaphragmatic hernia: Secondary | ICD-10-CM | POA: Diagnosis not present

## 2022-03-14 LAB — CBC
HCT: 33.4 % — ABNORMAL LOW (ref 36.0–46.0)
Hemoglobin: 10.7 g/dL — ABNORMAL LOW (ref 12.0–15.0)
MCH: 30.4 pg (ref 26.0–34.0)
MCHC: 32 g/dL (ref 30.0–36.0)
MCV: 94.9 fL (ref 80.0–100.0)
Platelets: 451 10*3/uL — ABNORMAL HIGH (ref 150–400)
RBC: 3.52 MIL/uL — ABNORMAL LOW (ref 3.87–5.11)
RDW: 14 % (ref 11.5–15.5)
WBC: 16.1 10*3/uL — ABNORMAL HIGH (ref 4.0–10.5)
nRBC: 0.4 % — ABNORMAL HIGH (ref 0.0–0.2)

## 2022-03-14 LAB — BASIC METABOLIC PANEL
Anion gap: 4 — ABNORMAL LOW (ref 5–15)
BUN: 11 mg/dL (ref 6–20)
CO2: 29 mmol/L (ref 22–32)
Calcium: 8.6 mg/dL — ABNORMAL LOW (ref 8.9–10.3)
Chloride: 101 mmol/L (ref 98–111)
Creatinine, Ser: 0.7 mg/dL (ref 0.44–1.00)
GFR, Estimated: >60 mL/min (ref >60)
Glucose, Bld: 135 mg/dL — ABNORMAL HIGH (ref 70–99)
Potassium: 3.8 mmol/L (ref 3.5–5.1)
Sodium: 134 mmol/L — ABNORMAL LOW (ref 135–145)

## 2022-03-14 NOTE — TOC Progression Note (Signed)
Transition of Care [facility]) - Progression Note    Patient Details  Name: Barbara Ward MRN: [id number] Date of Birth: Sep 19, 1970  [facility]) CM/SW Contact  Barbara Masson, RN Phone Number: 03/14/2022, 1:46 PM  Clinical Narrative:    Orders for home health and DME. Choice offered to Mother, [name]. Who choose [facility]. Angie with [facility] notified. Referral accepted for RN, OT,PT, aide. Hospital bed, WC and 3&1 ordered to be delivered to group home. April  [phone] person for delivery. Lacretia with [employer] notified.  Patient will need PTAR at discharge.   Expected Discharge Plan: Home w Home Health Services Barriers to Discharge: Continued Medical Work up  Expected Discharge Plan and Services Expected Discharge Plan: Home w Home Health Services In-house Referral: Clinical Social Work Discharge Planning Services: CM Consult   Living arrangements for the past 2 months: Group Home                 DME Arranged: 3-N-1, Wheelchair manual, Hospital bed DME Agency: [facility] Date DME Agency Contacted: 03/14/22 Time DME Agency Contacted: 1345 Representative spoke with at DME Agency: Lawernce Keas HH Arranged: RN, PT, OT, Nurse's Aide HH Agency: [facility] Date [facility] Agency Contacted: 03/14/22 Time HH Agency Contacted: 1345 Representative spoke with at [facility] Agency: Angie   Social Determinants of Health (SDOH) Interventions    Readmission Risk Interventions     No data to display

## 2022-03-14 NOTE — Progress Notes (Signed)
Subjective:   Hospital day 8.  Interval events: None.  Patient feeling well today.  Enjoying her breakfast.  Denies pain.  Denies nausea, vomiting.  Endorses regular bowel movements and flatus.  Does feel generally weak.  Objective:  Vital signs: Blood pressure 116/74, pulse (!) 108, temperature 97.9 F (36.6 C), temperature source Axillary, resp. rate 20, height 4\' 8"  (1.422 m), weight 79.4 kg, SpO2 98 %.  Physical exam: Constitutional: Female sitting upright in front of breakfast tray.  In no apparent distress. Cardiovascular: Regular rate and rhythm.  Pulmonary: Normal work of breathing noted. Chest: Mediastinal drain in place with serosanguineous output. Skin: Warm and dry. Extremities: Left radial pulse 2+. Neuro: Alert and oriented. Psych: Pleasant.  Appropriate mood and affect.   Intake/Output Summary (Last 24 hours) at 03/14/2022 0734 Last data filed at 03/14/2022 0400 Gross per 24 hour  Intake 500 ml  Output 985 ml  Net -485 ml    Pertinent Labs:    Latest Ref Rng & Units 03/14/2022    4:37 AM 03/13/2022    3:20 AM 03/12/2022    4:25 AM  CBC  WBC 4.0 - 10.5 K/uL 16.1  14.0  14.3   Hemoglobin 12.0 - 15.0 g/dL 03/14/2022  54.2  70.6   Hematocrit 36.0 - 46.0 % 33.4  32.3  31.0   Platelets 150 - 400 K/uL 451  370  297        Latest Ref Rng & Units 03/14/2022    4:37 AM 03/13/2022    3:20 AM 03/12/2022    4:25 AM  CMP  Glucose 70 - 99 mg/dL 03/14/2022  628  315   BUN 6 - 20 mg/dL 11  11  9    Creatinine 0.44 - 1.00 mg/dL 176   1.60   Sodium 135 - 145 mmol/L 134  136  137   Potassium 3.5 - 5.1 mmol/L 3.8  3.9  3.6   Chloride 98 - 111 mmol/L 101  103  105   CO2 22 - 32 mmol/L 29  27  27    Calcium 8.9 - 10.3 mg/dL 8.6  8.9  8.7    Assessment/Plan:   Principal Problem:   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 with a PMH of Prader-Willi syndrome and anterior diaphragmatic hernia status postoperative repair on 03/11/2022.  She initially presented with abdominal pain and was admitted due to concern for SBO.  Her postoperative course was complicated by respiratory failure and hypotension.  She was extubated on 03/09/2022 and pressors were discontinued on 03/11/2022.  She has been stable on the floor for 2 days.  Small bowel obstruction secondary to incarcerated left anterior diaphragmatic hernia Status post operative repair on 03/11/2022 Patient continues to do well on soft liquid diet.  We will monitor for 1 more day given the magnitude of the surgery. - Continue soft diet - JP drain remains in place until postop day 10 - Oxycodone 5 to 10 mg p.o. every 4 hours as needed for moderate to severe pain  Hypertension MAP stable 80s to 90s.  Hyperlipidemia - Continue atorvastatin 40 mg daily  Hypothyroidism TSH 0.474 on admission - Continue Synthroid 137 mcg daily  Vitamin D deficiency - Continue cholecalciferol  Lymphedema - Continue Lasix 40 mg daily  Anxiety - Continue home alprazolam, BuSpar, citalopram  Normocytic anemia, stable Hemoglobin stable for 3 days.  Diet:  Soft IVF: None,None VTE: Enoxaparin Code: Full PT/OT recs: SNF for Subacute PT, 3in1, hospital bed, wheelchair. TOC recs: Coordinating HH services for RN, OT, PT, aide. Family Update: mother by phone  Dispo: Anticipated discharge to  group home  in 1 days pending diet tolerance.   Marrianne Mood MD 03/14/2022, 7:34 AM  Pager: [phone] After 5pm on weekdays and 1pm on weekends: On Call pager: [phone]

## 2022-03-14 NOTE — Progress Notes (Addendum)
    Durable Medical Equipment  (From admission, onward)           Start     Ordered   03/14/22 1320  For home use only DME standard manual wheelchair with seat cushion  Once       Comments: Patient suffers from deconditioning which impairs their ability to perform daily activities like dressing in the home.  A walker will not resolve issue with performing activities of daily living. A wheelchair will allow patient to safely perform daily activities. Patient can safely propel the wheelchair in the home or has a caregiver who can provide assistance. Length of need 6 months . Accessories: elevating leg rests (ELRs), wheel locks, extensions and anti-tippers.   03/14/22 1326   03/14/22 1318  For home use only DME Hospital bed  Once       Question Answer Comment  Length of Need 6 Months   Bed type Semi-electric      03/14/22 1326   03/14/22 1318  For home use only DME 3 n 1  Once        03/14/22 1326

## 2022-03-14 NOTE — Care Management Important Message (Signed)
Important Message  Patient Details  Name: SAPHIA VANDERFORD MRN: [id number] Date of Birth: 1971/08/04   Medicare Important Message Given:  Yes     Harper Vandervoort Stefan Church 03/14/2022, 3:58 PM

## 2022-03-14 NOTE — TOC Progression Note (Signed)
Transition of Care [facility]) - Progression Note    Patient Details  Name: VERONA HARTSHORN MRN: [id number] Date of Birth: 1971/08/19  [facility] CM/SW Contact  Carley Hammed, [place] Phone Number: 03/14/2022, 12:48 PM  Clinical Narrative:    CSW spoke with pt's mother to provide bed offers. Mother states she would prefer for pt to return to her group home due to her specific needs, and her being familiar to them. Information was provided for [facility], [facility] group home director [phone]). CSW reached out and April confirmed they could handle pt's level of care. DME and HH were discussed. Facility requested a hospital bed, bedside commode, and wheelchair. CSW updated CM, who will arrange DME and HH. FL2 will be faxed to [phone]. TOC will continue to follow for DC needs.   Expected Discharge Plan: Skilled Nursing Facility Barriers to Discharge: Continued Medical Work up  Expected Discharge Plan and Services Expected Discharge Plan: Skilled Nursing Facility In-house Referral: Clinical Social Work     Living arrangements for the past 2 months: Group Home                                       Social Determinants of Health (SDOH) Interventions    Readmission Risk Interventions     No data to display

## 2022-03-14 NOTE — Progress Notes (Signed)
Physical Therapy Treatment Patient Details Name: Barbara Ward MRN: [id number] DOB: [birth year] Today's Date: 03/14/2022   History of Present Illness pt is a 51 y/o female admitted 7/15 for abdominal pain due to a very large diaphramatic hernia with evidence of SBO.  Hernia repair 7/18 and left intubated  7/19  extubated.  PMHx:  MR, Prader-Willi syndrome    PT Comments    Pt was seen for mobility on RW and to get onto and off side of bed with body mechanics support and instructions for sequencing the activities.  Her mother is in attendance with all the session.  Pt had an episode of fecal incontinence, and when asked by PT did state she knew it had happened.  Asked her to let staff know when these things happen so her skin can be attended to frequently.  Follow along with her to use Stedy if able and to progress to gait as she was doing prior to her surgery.  Pt is very motivated and hardworking, follow up with goals of acute therapy as outlined in POC.    Recommendations for follow up therapy are one component of a multi-disciplinary discharge planning process, led by the attending physician.  Recommendations may be updated based on patient status, additional functional criteria and insurance authorization.  Follow Up Recommendations  Skilled nursing-short term rehab (<3 hours/day) Can patient physically be transported by private vehicle: No   Assistance Recommended at Discharge Frequent or constant Supervision/Assistance  Patient can return home with the following A little help with bathing/dressing/bathroom;Assistance with cooking/housework;Assist for transportation;Help with stairs or ramp for entrance;A lot of help with walking and/or transfers   Equipment Recommendations  None recommended by PT (determine in rehab venue)    Recommendations for Other Services       Precautions / Restrictions Precautions Precautions: Fall Restrictions Weight Bearing Restrictions: No      Mobility  Bed Mobility Overal bed mobility: Needs Assistance Bed Mobility: Supine to Sit, Sit to Supine     Supine to sit: Max assist, HOB elevated Sit to supine: Max assist   General bed mobility comments: used bed pad to get to EOB due to pt being weak and discomfort to touch LE's    Transfers Overall transfer level: Needs assistance Equipment used: Rolling walker (2 wheels), 1 person hand held assist Transfers: Sit to/from Stand Sit to Stand: Mod assist, Max assist           General transfer comment: stood twice with RW and then had to get pt cleaned up to return to bed    Ambulation/Gait               General Gait Details: unable to perform   Stairs             Wheelchair Mobility    Modified Rankin (Stroke Patients Only)       Balance Overall balance assessment: Needs assistance Sitting-balance support: Feet supported Sitting balance-[name]: Fair     Standing balance support: Bilateral upper extremity supported, During functional activity Standing balance-[name]: Poor                              Cognition Arousal/Alertness: Awake/alert Behavior During Therapy: WFL for tasks assessed/performed Overall Cognitive Status: History of cognitive impairments - at baseline  General Comments: guardian who is mother in room during session        Exercises      General Comments General comments (skin integrity, edema, etc.): pt has made good progress wth mobility since beginning therapy, noted her efforts to get to standing increased pt independence by her willingness to transition to stand with her own effort      Pertinent Vitals/Pain Pain Assessment Pain Assessment: Faces Faces Pain Scale: Hurts little more Pain Location: general with ROM Pain Descriptors / Indicators: Guarding, Grimacing Pain Intervention(s): Monitored during session, Repositioned, Limited activity  within patient's tolerance    Home Living                          Prior Function            PT Goals (current goals can now be found in the care plan section) Acute Rehab PT Goals Patient Stated Goal: be able to do myself Progress towards PT goals: Progressing toward goals    Frequency    Min 3X/week      PT Plan Current plan remains appropriate    Co-evaluation              AM-PAC PT "6 Clicks" Mobility   Outcome Measure  Help needed turning from your back to your side while in a flat bed without using bedrails?: A Lot Help needed moving from lying on your back to sitting on the side of a flat bed without using bedrails?: A Lot Help needed moving to and from a bed to a chair (including a wheelchair)?: Total Help needed standing up from a chair using your arms (e.g., wheelchair or bedside chair)?: Total Help needed to walk in hospital room?: Total Help needed climbing 3-5 steps with a railing? : Total 6 Click Score: 8    End of Session   Activity Tolerance: Patient tolerated treatment well;Patient limited by fatigue Patient left: in bed;with call bell/phone within reach;with bed alarm set;with family/visitor present Nurse Communication: Mobility status PT Visit Diagnosis: Other abnormalities of gait and mobility (R26.89);Muscle weakness (generalized) (M62.81)     Time: 8469-6295 PT Time Calculation (min) (ACUTE ONLY): 56 min  Charges:  $Therapeutic Activity: 23-37 mins $Neuromuscular Re-education: 8-22 mins $Self Care/Home Management: 8-22 Barbara Ward 03/14/2022, 6:36 PM  Barbara Ward, PT PhD Acute Rehab Dept. Number: [facility] [id number] and [facility] [phone]

## 2022-03-14 NOTE — Progress Notes (Signed)
6 Days Post-Op  Subjective: Eating soft food for breakfast this morning. Denies nausea, vomiting and difficulty swallowing.   Objective: Vital signs in last 24 hours: Temp:  [97.9 F (36.6 C)] 97.9 F (36.6 C) (07/23 2331) Pulse Rate:  [108-114] 108 (07/24 0400) Resp:  [20] 20 (07/23 2331) BP: (96-116)/(74-80) 116/74 (07/24 0400) SpO2:  [95 %-98 %] 98 % (07/24 0400) Last BM Date : 03/13/22  Intake/Output from previous day: 07/23 0701 - 07/24 0700 In: 500 [IV Piggyback:500] Out: 985 [Urine:650; Drains:335] Intake/Output this shift: No intake/output data recorded.  PE: Abd: soft, nontender, nondistended, incisions are c/d/I with gauze and tegaderm, JP drain serosang output   Lab Results:  Recent Labs    03/13/22 0320 03/14/22 0437  WBC 14.0* 16.1*  HGB 10.6* 10.7*  HCT 32.3* 33.4*  PLT 370 451*   BMET Recent Labs    03/13/22 0320 03/14/22 0437  NA 136 134*  K 3.9 3.8  CL 103 101  CO2 27 29  GLUCOSE 173* 135*  BUN 11 11  CREATININE 0.66 0.70  CALCIUM 8.9 8.6*   PT/INR No results for input(s): "LABPROT", "INR" in the last 72 hours. CMP     Component Value Date/Time   NA 134 (L) 03/14/2022 0437   K 3.8 03/14/2022 0437   CL 101 03/14/2022 0437   CO2 29 03/14/2022 0437   GLUCOSE 135 (H) 03/14/2022 0437   BUN 11 03/14/2022 0437   CREATININE 0.70 03/14/2022 0437   CALCIUM 8.6 (L) 03/14/2022 0437   PROT 4.9 (L) 03/09/2022 2307   ALBUMIN 2.4 (L) 03/09/2022 2307   AST 31 03/09/2022 2307   ALT 35 03/09/2022 2307   ALKPHOS 60 03/09/2022 2307   BILITOT 0.9 03/09/2022 2307   GFRNONAA >60 03/14/2022 0437   GFRAA >60 03/04/2018 0427   Lipase     Component Value Date/Time   LIPASE 24 03/03/2022 1108       Studies/Results: No results found.  Anti-infectives: Anti-infectives (From admission, onward)    Start     Dose/Rate Route Frequency Ordered Stop   04/01/2022 1230  vancomycin (VANCOREADY) IVPB 1500 mg/300 mL        1,500 mg 150 mL/hr over  120 Minutes Intravenous On call to O.R. 03/07/22 2150 April 01, 2022 1425   2022-04-01 0600  gentamicin (GARAMYCIN) 300 mg in dextrose 5 % 100 mL IVPB       See Hyperspace for full Linked Orders Report.   300 mg 107.5 mL/hr over 60 Minutes Intravenous On call to O.R. 03/07/22 1321 04/01/2022 1430   April 01, 2022 0600  Vancomycin (VANCOCIN) 1,500 mg in sodium chloride 0.9 % 500 mL IVPB  Status:  Discontinued        1,500 mg 250 mL/hr over 120 Minutes Intravenous  Once 03/07/22 1424 03/07/22 2154   03/07/22 1415  Vancomycin (VANCOCIN) 1,500 mg in sodium chloride 0.9 % 500 mL IVPB  Status:  Discontinued       See Hyperspace for full Linked Orders Report.   1,500 mg 250 mL/hr over 120 Minutes Intravenous  Once 03/07/22 1321 03/07/22 1425        Assessment/Plan POD 6, s/p  ROBOTIC RECURRENT ANTERIOR DIAPHRAGMATIC HERNIA REPAIR WITH MESH ROBOTIC LYSIS OF ADHESIONS by Dr. Michaell Cowing 7/18 - Soft diet - Keep JP drain until POD10 -mobilize, therapies -IS, pulm toilet   FEN - Soft diet VTE - Lovenox ID - none currently needed  Prader-Willi syndrome Acute hypoxic post op respiratory failure - resolved MR HLD  OSA    LOS: 8 days    Sophronia Simas, MD [facility] General, Hepatobiliary and Pancreatic Surgery 03/14/22 8:32 AM

## 2022-03-14 NOTE — Discharge Instructions (Signed)
Barbara Ward  You were admitted for abdominal pain, nausea, vomiting, and treated for small bowel obstruction secondary to an internal hernia.  You underwent surgery to correct the hernia.  Now that you are able to eat and drink on your own without complications we are discharging you from the hospital to continue your recovery at home.  To help assist you on your road to recovery, I have written the following recommendations:   1.  Follow a light diet your first few days home from the hospital.  Drink plenty of fluids.  Avoid heavy, fatty foods as these may irritate your stomach in the days daily after your surgery.  2.  Continue taking your medications as you were before your hospitalization.  You may use Tylenol or ibuprofen to help manage postoperative pain.  There were discrepant reports regarding your home buspirone (BuSpar) dosing upon admission.  While hospitalized you took 15 mg twice daily.  You seemed to do well on this dose, so you may continue this upon your return home.  Please call your doctor or return to the hospital if you experience signs and symptoms of bowel obstruction or intra-abdominal infection, including, but not limited to: Severe abdominal pain, severe nausea and vomiting, inability to eat or drink anything, fever greater than 100.4 F, confusion, inability to urinate, increased pain, redness, or drainage from your incision.  It was a privilege to be a part of your hospital care team, and I hope you feel better as a result of your stay.  All the best, Marrianne Mood, MD   [facility]  Please arrive at least 30 min before your appointment to complete your check in paperwork.  If you are unable to arrive 30 min prior to your appointment time we may have to cancel or reschedule you. LAPAROSCOPIC SURGERY: POST OP INSTRUCTIONS Always review your discharge instruction sheet given to you by the facility where your surgery was performed. IF YOU  HAVE DISABILITY OR FAMILY LEAVE FORMS, YOU MUST BRING THEM TO THE OFFICE FOR PROCESSING.   DO NOT GIVE THEM TO YOUR DOCTOR.  PAIN CONTROL  First take acetaminophen (Tylenol) AND/or ibuprofen (Advil) to control your pain after surgery.  Follow directions on package.  Taking acetaminophen (Tylenol) and/or ibuprofen (Advil) regularly after surgery will help to control your pain and lower the amount of prescription pain medication you may need.  You should not take more than 4,000 mg (4 grams) of acetaminophen (Tylenol) in 24 hours.  You should not take ibuprofen (Advil), aleve, motrin, naprosyn or other NSAIDS if you have a history of stomach ulcers or chronic kidney disease.  A prescription for pain medication may be given to you upon discharge.  Take your pain medication as prescribed, if you still have uncontrolled pain after taking acetaminophen (Tylenol) or ibuprofen (Advil). Use ice packs to help control pain. If you need a refill on your pain medication, please contact your pharmacy.  They will contact our office to request authorization. Prescriptions will not be filled after 5pm or on week-ends.  HOME MEDICATIONS Take your usually prescribed medications unless otherwise directed.  DIET You should follow a light diet the first few days after arrival home.  Be sure to include lots of fluids daily. Avoid fatty, fried foods.   CONSTIPATION It is common to experience some constipation after surgery and if you are taking pain medication.  Increasing fluid intake and taking a stool softener (such as Colace) will usually help or  prevent this problem from occurring.  A mild laxative (Milk of Magnesia or Miralax) should be taken according to package instructions if there are no bowel movements after 48 hours.  WOUND/INCISION CARE Most patients will experience some swelling and bruising in the area of the incisions.  Ice packs will help.  Swelling and bruising can take several days to resolve.   Unless discharge instructions indicate otherwise, follow guidelines below  STERI-STRIPS - you may remove your outer bandages 48 hours after surgery, and you may shower at that time.  You have steri-strips (small skin tapes) in place directly over the incision.  These strips should be left on the skin for 7-10 days.   DERMABOND/SKIN GLUE - you may shower in 24 hours.  The glue will flake off over the next 2-3 weeks. Any sutures or staples will be removed at the office during your follow-up visit.  ACTIVITIES You may resume regular (light) daily activities beginning the next day--such as daily self-care, walking, climbing stairs--gradually increasing activities as tolerated.  You may have sexual intercourse when it is comfortable.  Refrain from any heavy lifting or straining until approved by your doctor. You may drive when you are no longer taking prescription pain medication, you can comfortably wear a seatbelt, and you can safely maneuver your car and apply brakes.  FOLLOW-UP You should see your doctor in the office for a follow-up appointment approximately 2-3 weeks after your surgery.  You should have been given your post-op/follow-up appointment when your surgery was scheduled.  If you did not receive a post-op/follow-up appointment, make sure that you call for this appointment within a day or two after you arrive home to insure a convenient appointment time.   WHEN TO CALL YOUR DOCTOR: Fever over 101.0 Inability to urinate Continued bleeding from incision. Increased pain, redness, or drainage from the incision. Increasing abdominal pain  The clinic staff is available to answer your questions during regular business hours.  Please don't hesitate to call and ask to speak to one of the nurses for clinical concerns.  If you have a medical emergency, go to the nearest emergency room or call 911.  A surgeon from [facility] is always on call at the hospital. [address], [address], [place], [place]  [address] ? [address] [address], [place], [place]   [address] [phone] ? [phone] ? FAX [phone]

## 2022-03-15 ENCOUNTER — Inpatient Hospital Stay (HOSPITAL_COMMUNITY): Payer: Medicare Other

## 2022-03-15 DIAGNOSIS — Q790 Congenital diaphragmatic hernia: Secondary | ICD-10-CM | POA: Diagnosis not present

## 2022-03-15 LAB — URINALYSIS, ROUTINE W REFLEX MICROSCOPIC
Bilirubin Urine: NEGATIVE
Glucose, UA: NEGATIVE mg/dL
Ketones, ur: NEGATIVE mg/dL
Nitrite: NEGATIVE
Protein, ur: NEGATIVE mg/dL
Specific Gravity, Urine: 1.021 (ref 1.005–1.030)
pH: 7 (ref 5.0–8.0)

## 2022-03-15 LAB — CBC
HCT: 31.2 % — ABNORMAL LOW (ref 36.0–46.0)
Hemoglobin: 10.4 g/dL — ABNORMAL LOW (ref 12.0–15.0)
MCH: 31.2 pg (ref 26.0–34.0)
MCHC: 33.3 g/dL (ref 30.0–36.0)
MCV: 93.7 fL (ref 80.0–100.0)
Platelets: 465 10*3/uL — ABNORMAL HIGH (ref 150–400)
RBC: 3.33 MIL/uL — ABNORMAL LOW (ref 3.87–5.11)
RDW: 14.1 % (ref 11.5–15.5)
WBC: 17.6 10*3/uL — ABNORMAL HIGH (ref 4.0–10.5)
nRBC: 0.6 % — ABNORMAL HIGH (ref 0.0–0.2)

## 2022-03-15 LAB — GLUCOSE, CAPILLARY: Glucose-Capillary: 158 mg/dL — ABNORMAL HIGH (ref 70–99)

## 2022-03-15 MED ORDER — POLYETHYLENE GLYCOL 3350 17 GM/SCOOP PO POWD
0.5000 | Freq: Once | ORAL | Status: AC
  Administered 2022-03-15: 127.5 g via ORAL
  Filled 2022-03-15: qty 255

## 2022-03-15 MED ORDER — POLYETHYLENE GLYCOL 3350 17 G PO PACK
17.0000 g | PACK | Freq: Every day | ORAL | Status: DC
  Administered 2022-03-17 – 2022-03-22 (×3): 17 g via ORAL
  Filled 2022-03-15 (×5): qty 1

## 2022-03-15 MED ORDER — SORBITOL 70 % SOLN
960.0000 mL | TOPICAL_OIL | Freq: Once | ORAL | Status: AC
  Administered 2022-03-15: 960 mL via RECTAL
  Filled 2022-03-15: qty 473

## 2022-03-15 MED ORDER — ACETAMINOPHEN 500 MG PO TABS: 500.0000 mg | ORAL_TABLET | Freq: Four times a day (QID) | ORAL | 0 refills | Status: AC

## 2022-03-15 NOTE — Plan of Care (Signed)
  Problem: Education: Goal: Knowledge of General Education information will improve Description: Including pain rating scale, medication(s)/side effects and non-pharmacologic comfort measures Outcome: Progressing   Problem: Health Behavior/Discharge Planning: Goal: Ability to manage health-related needs will improve Outcome: Progressing   Problem: Clinical Measurements: Goal: Ability to maintain clinical measurements within normal limits will improve Outcome: Progressing Goal: Will remain free from infection Outcome: Progressing Goal: Diagnostic test results will improve Outcome: Progressing Goal: Respiratory complications will improve Outcome: Progressing Goal: Cardiovascular complication will be avoided Outcome: Progressing   Problem: Nutrition: Goal: Adequate nutrition will be maintained Outcome: Progressing   Problem: Coping: Goal: Level of anxiety will decrease Outcome: Progressing   Problem: Elimination: Goal: Will not experience complications related to bowel motility Outcome: Progressing Goal: Will not experience complications related to urinary retention Outcome: Progressing   Problem: Safety: Goal: Ability to remain free from injury will improve Outcome: Progressing   Problem: Skin Integrity: Goal: Risk for impaired skin integrity will decrease Outcome: Progressing   Problem: Education: Goal: Ability to describe self-care measures that may prevent or decrease complications (Diabetes Survival Skills Education) will improve Outcome: Progressing Goal: Individualized Educational Video(s) Outcome: Progressing   Problem: Coping: Goal: Ability to adjust to condition or change in health will improve Outcome: Progressing   Problem: Fluid Volume: Goal: Ability to maintain a balanced intake and output will improve Outcome: Progressing   Problem: Health Behavior/Discharge Planning: Goal: Ability to identify and utilize available resources and services will  improve Outcome: Progressing Goal: Ability to manage health-related needs will improve Outcome: Progressing   Problem: Metabolic: Goal: Ability to maintain appropriate glucose levels will improve Outcome: Progressing   Problem: Nutritional: Goal: Maintenance of adequate nutrition will improve Outcome: Progressing Goal: Progress toward achieving an optimal weight will improve Outcome: Progressing   Problem: Skin Integrity: Goal: Risk for impaired skin integrity will decrease Outcome: Progressing   Problem: Tissue Perfusion: Goal: Adequacy of tissue perfusion will improve Outcome: Progressing   

## 2022-03-15 NOTE — Progress Notes (Signed)
Subjective:   Hospital day 9.  Interval events: None.  Patient complaining of abdominal pain today.  It is worse when she tries to poop.  She has been moving her bowels but she feels like she might be constipated as well.  Her mother notes dark urine via pure wick catheter.  Objective:  Vital signs: Blood pressure 117/80, pulse (!) 108, temperature 98.2 F (36.8 C), temperature source Oral, resp. rate 18, height 4\' 8"  (1.422 m), weight 79.4 kg, SpO2 100 %.  Physical exam: Constitutional: Female laying reclined in bed.  In some discomfort. Cardiovascular: Tachycardia. Pulmonary: Normal work of breathing. Abdominal: Diffuse tenderness to palpation.  Soft.  Nondistended. Skin: Warm and dry. Extremities: Right radial pulse 2+. Neuro: Alert and oriented.   Intake/Output Summary (Last 24 hours) at 03/15/2022 1427 Last data filed at 03/15/2022 0644 Gross per 24 hour  Intake --  Output 150 ml  Net -150 ml    Pertinent Labs:    Latest Ref Rng & Units 03/15/2022   12:03 PM 03/14/2022    4:37 AM 03/13/2022    3:20 AM  CBC  WBC 4.0 - 10.5 K/uL 17.6  16.1  14.0   Hemoglobin 12.0 - 15.0 g/dL 03/15/2022  62.7  03.5   Hematocrit 36.0 - 46.0 % 31.2  33.4  32.3   Platelets 150 - 400 K/uL 465  451  370        Latest Ref Rng & Units 03/14/2022    4:37 AM 03/13/2022    3:20 AM 03/12/2022    4:25 AM  CMP  Glucose 70 - 99 mg/dL 03/14/2022  381  829   BUN 6 - 20 mg/dL 11  11  9    Creatinine 0.44 - 1.00 mg/dL 937   1.69   Sodium 135 - 145 mmol/L 134  136  137   Potassium 3.5 - 5.1 mmol/L 3.8  3.9  3.6   Chloride 98 - 111 mmol/L 101  103  105   CO2 22 - 32 mmol/L 29  27  27    Calcium 8.9 - 10.3 mg/dL 8.6  8.9  8.7    Urinalysis pending.  Imaging: Abdominal x-ray: Moderate stool burden.  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 with a PMH of Prader-Willi syndrome and anterior diaphragmatic hernia status postoperative repair on March 16, 2022.  She initially presented with abdominal pain and was admitted due to concern for SBO.  Her postoperative course was complicated by respiratory failure and hypotension.  She was extubated on 03/09/2022 and pressors were discontinued on 03/11/2022.  Today she complains of new and worsening abdominal pain.  Small bowel obstruction secondary to incarcerated left anterior diaphragmatic hernia Status post operative repair on 03-16-22 Acute abdominal pain today.  Continues to have bowel movements (reported as significant by nursing staff) and flatus.  Don't suspect obstruction. However, with tachycardia and slowly uptrending leukocytosis, I have some concern for underlying infection or perforation.   - Continue soft diet as tolerated. - JP drain remains in place until 03/18/2022 - Continue acetaminophen 1000 mg oral every 6 hours -  Continue oxycodone 5 to 10 mg oral every 4 hours as needed for moderate-severe pain. - Follow-up CT scan  Dark urine Urine in suction canister noted to be tea colored.  Uncertain about chronicity, but UA on admission 10 days ago was clear, yellow and negative for hemoglobin or bilirubin. - Follow-up urinalysis  Hypertension MAP stable 80s to 90s.  Hyperlipidemia - Continue atorvastatin 40 mg daily  Hypothyroidism TSH 0.474 on admission - Continue Synthroid 137 mcg daily  Vitamin D deficiency - Continue cholecalciferol  Lymphedema - Continue Lasix 40 mg daily  Anxiety - Continue home alprazolam, BuSpar, citalopram  Diet:  Soft IVF: None,None VTE: Enoxaparin Code: Full Family Update: Mother at bedside this afternoon   Dispo: Anticipated  discharge to  Group home  in 1 days pending medical evaluation and treatment of abdominal pain.   Marrianne Mood MD 03/15/2022, 2:27 PM  Pager: [phone] After 5pm on weekdays and 1pm on weekends: On Call pager: [phone]

## 2022-03-15 NOTE — Progress Notes (Cosign Needed Addendum)
7 Days Post-Op  Subjective: Ate breakfast great this morning, but has started having what she describes as severe upper abdominal pain and pain in her RUQ.  She is very anxious about this this morning.  This is certainly new for her.   She denies nausea or vomiting and states that eating did not seem to make this worse.   Objective: Vital signs in last 24 hours: Temp:  [98.1 F (36.7 C)-98.2 F (36.8 C)] 98.2 F (36.8 C) (07/25 0400) Pulse Rate:  [108-116] 108 (07/25 0400) Resp:  [18-20] 18 (07/25 0400) BP: (111-117)/(68-80) 117/80 (07/25 0400) SpO2:  [95 %-100 %] 100 % (07/25 0400) Last BM Date : 03/13/22  Intake/Output from previous day: 07/24 0701 - 07/25 0700 In: -  Out: 150 [Drains:150] Intake/Output this shift: No intake/output data recorded.  PE: Abd: soft, somewhat tender in RUQ and epigastrium, nondistended, incisions are c/d/I with gauze and tegaderm, JP drain serosang output, 150cc   Lab Results:  Recent Labs    03/13/22 0320 03/14/22 0437  WBC 14.0* 16.1*  HGB 10.6* 10.7*  HCT 32.3* 33.4*  PLT 370 451*   BMET Recent Labs    03/13/22 0320 03/14/22 0437  NA 136 134*  K 3.9 3.8  CL 103 101  CO2 27 29  GLUCOSE 173* 135*  BUN 11 11  CREATININE 0.66 0.70  CALCIUM 8.9 8.6*   PT/INR No results for input(s): "LABPROT", "INR" in the last 72 hours. CMP     Component Value Date/Time   NA 134 (L) 03/14/2022 0437   K 3.8 03/14/2022 0437   CL 101 03/14/2022 0437   CO2 29 03/14/2022 0437   GLUCOSE 135 (H) 03/14/2022 0437   BUN 11 03/14/2022 0437   CREATININE 0.70 03/14/2022 0437   CALCIUM 8.6 (L) 03/14/2022 0437   PROT 4.9 (L) 03/09/2022 2307   ALBUMIN 2.4 (L) 03/09/2022 2307   AST 31 03/09/2022 2307   ALT 35 03/09/2022 2307   ALKPHOS 60 03/09/2022 2307   BILITOT 0.9 03/09/2022 2307   GFRNONAA >60 03/14/2022 0437   GFRAA >60 03/04/2018 0427   Lipase     Component Value Date/Time   LIPASE 24 03/09/2022 1108       Studies/Results: No  results found.  Anti-infectives: Anti-infectives (From admission, onward)    Start     Dose/Rate Route Frequency Ordered Stop   02/20/2022 1230  vancomycin (VANCOREADY) IVPB 1500 mg/300 mL        1,500 mg 150 mL/hr over 120 Minutes Intravenous On call to O.R. 03/07/22 2150 03/12/2022 1425   02/23/2022 0600  gentamicin (GARAMYCIN) 300 mg in dextrose 5 % 100 mL IVPB       See Hyperspace for full Linked Orders Report.   300 mg 107.5 mL/hr over 60 Minutes Intravenous On call to O.R. 03/07/22 1321 03/03/2022 1430   02/26/2022 0600  Vancomycin (VANCOCIN) 1,500 mg in sodium chloride 0.9 % 500 mL IVPB  Status:  Discontinued        1,500 mg 250 mL/hr over 120 Minutes Intravenous  Once 03/07/22 1424 03/07/22 2154   03/07/22 1415  Vancomycin (VANCOCIN) 1,500 mg in sodium chloride 0.9 % 500 mL IVPB  Status:  Discontinued       See Hyperspace for full Linked Orders Report.   1,500 mg 250 mL/hr over 120 Minutes Intravenous  Once 03/07/22 1321 03/07/22 1425        Assessment/Plan POD 7, s/p  ROBOTIC RECURRENT ANTERIOR DIAPHRAGMATIC HERNIA REPAIR WITH  MESH ROBOTIC LYSIS OF ADHESIONS by Dr. Michaell Cowing 7/18 - Soft diet - Keep JP drain until POD10 -mobilize, therapies -IS, pulm toilet -new upper abdominal pain.  HR in 110s.  Not associated with eating.  WBC 16K yesterday.  Will recheck CBC today and obtain a plain film to start.  D/w primary service and seems like she may have inferred that pain started after PT earlier today.  Would like to rule out an issue or at least observe her to assure this improves prior to discharge.    ADDENDUM: Plain film shows significant stool burden, but no other significant abnormalities.  Will give half a bottle of miralax and a SMOG enema.   FEN - Soft diet VTE - Lovenox ID - none currently needed  Prader-Willi syndrome Acute hypoxic post op respiratory failure - resolved MR HLD OSA    LOS: 9 days    Sophronia Simas, MD [facility] General, Hepatobiliary  and Pancreatic Surgery 03/15/22 11:22 AM

## 2022-03-16 ENCOUNTER — Inpatient Hospital Stay (HOSPITAL_COMMUNITY): Payer: Medicare Other

## 2022-03-16 DIAGNOSIS — Q790 Congenital diaphragmatic hernia: Secondary | ICD-10-CM | POA: Diagnosis not present

## 2022-03-16 LAB — CBC
HCT: 30.8 % — ABNORMAL LOW (ref 36.0–46.0)
Hemoglobin: 10.1 g/dL — ABNORMAL LOW (ref 12.0–15.0)
MCH: 30.9 pg (ref 26.0–34.0)
MCHC: 32.8 g/dL (ref 30.0–36.0)
MCV: 94.2 fL (ref 80.0–100.0)
Platelets: 497 10*3/uL — ABNORMAL HIGH (ref 150–400)
RBC: 3.27 MIL/uL — ABNORMAL LOW (ref 3.87–5.11)
RDW: 14.1 % (ref 11.5–15.5)
WBC: 17.7 10*3/uL — ABNORMAL HIGH (ref 4.0–10.5)
nRBC: 0.5 % — ABNORMAL HIGH (ref 0.0–0.2)

## 2022-03-16 LAB — BASIC METABOLIC PANEL
Anion gap: 8 (ref 5–15)
BUN: 17 mg/dL (ref 6–20)
CO2: 29 mmol/L (ref 22–32)
Calcium: 8.7 mg/dL — ABNORMAL LOW (ref 8.9–10.3)
Chloride: 98 mmol/L (ref 98–111)
Creatinine, Ser: 0.6 mg/dL (ref 0.44–1.00)
GFR, Estimated: >60 mL/min (ref >60)
Glucose, Bld: 125 mg/dL — ABNORMAL HIGH (ref 70–99)
Potassium: 3.5 mmol/L (ref 3.5–5.1)
Sodium: 135 mmol/L (ref 135–145)

## 2022-03-16 LAB — CK: Total CK: 26 U/L — ABNORMAL LOW (ref 38–234)

## 2022-03-16 MED ORDER — LACTATED RINGERS IV BOLUS: 250.0000 mL | Freq: Once | INTRAVENOUS | Status: DC

## 2022-03-16 MED ORDER — IOHEXOL 9 MG/ML PO SOLN
ORAL | Status: AC
  Administered 2022-03-16: 500 mL
  Filled 2022-03-16: qty 1000

## 2022-03-16 MED ORDER — LACTATED RINGERS IV BOLUS
500.0000 mL | Freq: Once | INTRAVENOUS | Status: AC
  Administered 2022-03-16: 500 mL via INTRAVENOUS

## 2022-03-16 MED ORDER — IOHEXOL 300 MG/ML  SOLN
100.0000 mL | Freq: Once | INTRAMUSCULAR | Status: AC | PRN
  Administered 2022-03-16: 100 mL via INTRAVENOUS

## 2022-03-16 MED ORDER — IOHEXOL 9 MG/ML PO SOLN: 500.0000 mL | ORAL | Status: AC

## 2022-03-16 NOTE — Progress Notes (Signed)
,   8 Days Post-Op  Subjective: CC: Patients mom at bedside.  Patient reports increased generalized abdominal pain over the last 2 days that is worse today. Not worse in one location. Doing well with dys 3 diet without worsening abdominal pain, n/v and finishing most/all of her trays. Plain film yesterday showed significant stool burden - given half a bottle of miralax and a SMOG enema. Multiple liquid bm's overnight without improvement of her symptoms. Tachycardic overnight. Denies cp, sob, cough, or dysuria. Back on o2 this am.  Has chronic LE swelling 2/2 lymphedema but denies calf/LE pain but her Mom does report she seemed to have some leg pain on the L when moving the lower extremities in bed earlier this morning.   Objective: Vital signs in last 24 hours: Temp:  [97.8 F (36.6 C)-98.5 F (36.9 C)] 97.8 F (36.6 C) (07/26 0805) Pulse Rate:  [104-119] 104 (07/26 0805) Resp:  [19-20] 19 (07/26 0805) BP: (105-128)/(56-81) 115/65 (07/26 0328) SpO2:  [92 %-94 %] 94 % (07/26 0805) Last BM Date : 03/15/22  Intake/Output from previous day: 07/25 0701 - 07/26 0700 In: -  Out: 350 [Drains:350] Intake/Output this shift: No intake/output data recorded.  PE: Gen:  Alert, NAD, pleasant Card:  Tachycardic  Pulm:  Rate and effort normal. CTA b/l. On 2L Abd: Soft, mild distension, generalized ttp, +BS, incisions with steri-strips intact appears well and are without drainage, bleeding, or signs of infection. JP SS, 350cc/24 hours Ext: Chronic lymphedema. No calf ttp.   Lab Results:  Recent Labs    03/15/22 1203 03/16/22 0337  WBC 17.6* 17.7*  HGB 10.4* 10.1*  HCT 31.2* 30.8*  PLT 465* 497*   BMET Recent Labs    03/14/22 0437 03/16/22 0337  NA 134* 135  K 3.8 3.5  CL 101 98  CO2 29 29  GLUCOSE 135* 125*  BUN 11 17  CREATININE 0.70 0.60  CALCIUM 8.6* 8.7*   PT/INR No results for input(s): "LABPROT", "INR" in the last 72 hours. CMP     Component Value Date/Time   NA  135 03/16/2022 0337   K 3.5 03/16/2022 0337   CL 98 03/16/2022 0337   CO2 29 03/16/2022 0337   GLUCOSE 125 (H) 03/16/2022 0337   BUN 17 03/16/2022 0337   CREATININE 0.60 03/16/2022 0337   CALCIUM 8.7 (L) 03/16/2022 0337   PROT 4.9 (L) 03/09/2022 2307   ALBUMIN 2.4 (L) 03/09/2022 2307   AST 31 03/09/2022 2307   ALT 35 03/09/2022 2307   ALKPHOS 60 03/09/2022 2307   BILITOT 0.9 03/09/2022 2307   GFRNONAA >60 03/16/2022 0337   GFRAA >60 03/04/2018 0427   Lipase     Component Value Date/Time   LIPASE 24 02/19/2022 1108    Studies/Results: DG Abd 1 View  Result Date: 03/15/2022 CLINICAL DATA:  51 year old female presenting with abdominal pain and pain all over. EXAM: ABDOMEN - 1 VIEW COMPARISON:  March 08, 2022 FINDINGS: Since the previous exam there has been interval removal of the gastric tube. EKG leads project over the abdomen. Airspace disease at the lung bases is grossly similar to previous imaging. Bowel gas pattern without gross signs of obstruction. Moderate stool throughout the colon. Stool and gas in the rectum. On limited assessment no acute regional skeletal process. Extensive subcutaneous edema is suggested. Perhaps some residual subcutaneous emphysema particularly along the LEFT flank. Suspect improvement of subcutaneous emphysema seen previously. IMPRESSION: 1. Moderate stool throughout the colon. No signs of bowel obstruction.  2. Airspace disease at the lung bases grossly similar to previous imaging. 3. Subcutaneous edema and decreased subcutaneous emphysema grossly when compared to previous imaging, not well assessed. 4. Bibasilar airspace disease partially imaged. Electronically Signed   By: Donzetta Kohut M.D.   On: 03/15/2022 11:34    Anti-infectives: Anti-infectives (From admission, onward)    Start     Dose/Rate Route Frequency Ordered Stop   22-Mar-2022 1230  vancomycin (VANCOREADY) IVPB 1500 mg/300 mL        1,500 mg 150 mL/hr over 120 Minutes Intravenous On call to  O.R. 03/07/22 2150 03/22/2022 1425   Mar 22, 2022 0600  gentamicin (GARAMYCIN) 300 mg in dextrose 5 % 100 mL IVPB       See Hyperspace for full Linked Orders Report.   300 mg 107.5 mL/hr over 60 Minutes Intravenous On call to O.R. 03/07/22 1321 2022/03/22 1430   2022-03-22 0600  Vancomycin (VANCOCIN) 1,500 mg in sodium chloride 0.9 % 500 mL IVPB  Status:  Discontinued        1,500 mg 250 mL/hr over 120 Minutes Intravenous  Once 03/07/22 1424 03/07/22 2154   03/07/22 1415  Vancomycin (VANCOCIN) 1,500 mg in sodium chloride 0.9 % 500 mL IVPB  Status:  Discontinued       See Hyperspace for full Linked Orders Report.   1,500 mg 250 mL/hr over 120 Minutes Intravenous  Once 03/07/22 1321 03/07/22 1425        Assessment/Plan POD 8, s/p  ROBOTIC RECURRENT ANTERIOR DIAPHRAGMATIC HERNIA REPAIR WITH MESH ROBOTIC LYSIS OF ADHESIONS by Dr. Michaell Cowing 7/18 - D3 diet - Keep JP drain until at least POD10 - Mobilize, therapies - IS, pulm toilet - Given increased abdominal pain over the last 2 days with tachycardia and leukocytosis, will get CT. Her UA does show possilbe UTI (small leuk, many bacteria, 21-5 wbc) which will defer to primary as she denies urinary symptoms at this time. Also noted to have some L leg pain this morning per mother but does not have any pain at this time and no calf ttp at this time. Given underlying lymphedema, difficult to discern if any swelling. Will discuss with primary team to get their thoughts on obtaining BLE [place] as well.     FEN - D3 diet, IVF per primary VTE - Lovenox ID - None currently.    Prader-Willi syndrome Acute hypoxic post op respiratory failure - on 2L O2 this am.  MR HLD OSA HTN HLD Hypothyroidism Lymphedema - on lasix   LOS: 10 days    Jacinto Halim , [facility] Surgery 03/16/2022, 9:36 AM Please see Amion for pager number during day hours 7:00am-4:30pm

## 2022-03-16 NOTE — Progress Notes (Signed)
Physical Therapy Treatment Patient Details Name: Barbara Ward MRN: [id number] DOB: [birth year] Today's Date: 03/16/2022   History of Present Illness Pt is a 51 y/o female admitted 7/15 for abdominal pain due to a very large diaphramatic hernia with evidence of SBO.  Hernia repair 7/18 and left intubated  7/19  extubated.  PMHx:  MR, Prader-Willi syndrome    PT Comments    Pt making good progress this afternoon.  She was able to take a few steps, participated with multiple transfers; and stood increased time for ADLs.  Pt requiring mod A for transfers ; assist of 2 was helpful for assist and lines but could do with mod A of 1.  Continue to progress as able.     Recommendations for follow up therapy are one component of a multi-disciplinary discharge planning process, led by the attending physician.  Recommendations may be updated based on patient status, additional functional criteria and insurance authorization.  Follow Up Recommendations  Skilled nursing-short term rehab (<3 hours/day) Can patient physically be transported by private vehicle: No   Assistance Recommended at Discharge Frequent or constant Supervision/Assistance  Patient can return home with the following A little help with bathing/dressing/bathroom;Assistance with cooking/housework;Assist for transportation;Help with stairs or ramp for entrance;A lot of help with walking and/or transfers   Equipment Recommendations  Wheelchair cushion (measurements PT);Hospital bed;Wheelchair (measurements PT)    Recommendations for Other Services       Precautions / Restrictions Precautions Precautions: Fall Precaution Comments: JP drain, rectal tube     Mobility  Bed Mobility Overal bed mobility: Needs Assistance Bed Mobility: Supine to Sit     Supine to sit: Mod assist, +2 for physical assistance     General bed mobility comments: Increased cues for sequencing with assist to lift trunk and scoot forward     Transfers Overall transfer level: Needs assistance Equipment used: Rolling walker (2 wheels) Transfers: Sit to/from Stand Sit to Stand: Mod assist           General transfer comment: Stood x 1 from bed then x 4 from chair ; used RW; cues to push  up from bed/chair    Ambulation/Gait Ambulation/Gait assistance: Min assist, +2 safety/equipment Gait Distance (Feet): 3 Feet Assistive device: Rolling walker (2 wheels) Gait Pattern/deviations: Step-to pattern, Decreased stride length, Shuffle Gait velocity: decreased     General Gait Details: Pt able to small shuffle steps to the chair with cues for RW and min A to stabilize   Stairs             Wheelchair Mobility    Modified Rankin (Stroke Patients Only)       Balance Overall balance assessment: Needs assistance Sitting-balance support: Feet supported Sitting balance-[name]: Fair     Standing balance support: Bilateral upper extremity supported, During functional activity, Reliant on assistive device for balance Standing balance-[name]: Poor Standing balance comment: using RW; steady with RW;  Spent increased time standing due to rectal tube connector came loose and required cleaning.  Pt standing with close guarding for at least 5 mins.                            Cognition Arousal/Alertness: Awake/alert Behavior During Therapy: WFL for tasks assessed/performed Overall Cognitive Status: History of cognitive impairments - at baseline  General Comments: guardian who is mother in room during session        Exercises      General Comments General comments (skin integrity, edema, etc.): VSS      Pertinent Vitals/Pain Pain Assessment Pain Assessment: Faces Faces Pain Scale: Hurts a little bit Pain Location: general with ROM Pain Descriptors / Indicators: Guarding, Grimacing Pain Intervention(s): Limited activity within patient's  tolerance, Monitored during session    Home Living                          Prior Function            PT Goals (current goals can now be found in the care plan section) Progress towards PT goals: Progressing toward goals    Frequency    Min 3X/week      PT Plan Current plan remains appropriate    Co-evaluation              AM-PAC PT "6 Clicks" Mobility   Outcome Measure  Help needed turning from your back to your side while in a flat bed without using bedrails?: A Lot Help needed moving from lying on your back to sitting on the side of a flat bed without using bedrails?: A Lot Help needed moving to and from a bed to a chair (including a wheelchair)?: A Lot Help needed standing up from a chair using your arms (e.g., wheelchair or bedside chair)?: A Lot Help needed to walk in hospital room?: Total Help needed climbing 3-5 steps with a railing? : Total 6 Click Score: 10    End of Session Equipment Utilized During Treatment: Gait belt Activity Tolerance: Patient tolerated treatment well Patient left: with call bell/phone within reach;with family/visitor present;in chair;with chair alarm set Nurse Communication: Mobility status PT Visit Diagnosis: Other abnormalities of gait and mobility (R26.89);Muscle weakness (generalized) (M62.81)     Time: 1525-1600 PT Time Calculation (min) (ACUTE ONLY): 35 min  Charges:  $Therapeutic Activity: 23-37 mins                     Anise Salvo, PT Acute Rehab [facility] [phone]    Barbara Ward 03/16/2022, 4:50 PM

## 2022-03-16 NOTE — Progress Notes (Signed)
Subjective:   Hospital day 10.  Interval events: None.  Patient still with some abdominal discomfort.  Described as "soreness".  Continues to have loose, watery stool.  Still has a good appetite and is finishing meals.  Does not feel thirsty subjectively dehydrated.  Objective:  Vital signs: Blood pressure 116/73, pulse (!) 104, temperature 97.6 F (36.4 C), temperature source Oral, resp. rate 20, height 4\' 8"  (1.422 m), weight 79.4 kg, SpO2 94 %.  Physical exam: Constitutional: Female in semireclined position.  In some apparent discomfort. Cardiovascular: Tachycardia. Pulmonary: Normal work of breathing noted. Chest: Mediastinal drain in place with 75 cc serosanguineous output. Abdominal: Diffusely tender to palpation.  Firm abdomen.  Tympanic to percussion.  Nondistended. Skin: Warm and dry. Neuro: Alert and oriented.   Intake/Output Summary (Last 24 hours) at 03/16/2022 1322 Last data filed at 03/16/2022 0645 Gross per 24 hour  Intake --  Output 250 ml  Net -250 ml    Pertinent Labs:    Latest Ref Rng & Units 03/16/2022    3:37 AM 03/15/2022   12:03 PM 03/14/2022    4:37 AM  CBC  WBC 4.0 - 10.5 K/uL 17.7  17.6  16.1   Hemoglobin 12.0 - 15.0 g/dL 03/16/2022  15.1  76.1   Hematocrit 36.0 - 46.0 % 30.8  31.2  33.4   Platelets 150 - 400 K/uL 497  465  451        Latest Ref Rng & Units 03/16/2022    3:37 AM 03/14/2022    4:37 AM 03/13/2022    3:20 AM  CMP  Glucose 70 - 99 mg/dL 03/15/2022  371  062   BUN 6 - 20 mg/dL 17  11  11    Creatinine 0.44 - 1.00 mg/dL 694   8.54   Sodium 135 - 145 mmol/L 135  134  136   Potassium 3.5 - 5.1 mmol/L 3.5  3.8  3.9   Chloride 98 - 111 mmol/L 98  101  103   CO2 22 - 32 mmol/L 29  29  27    Calcium 8.9 - 10.3 mg/dL 8.7  8.6  8.9    CT A/P with IV/PO contrast pending  Assessment/Plan:   Principal Problem:   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 with a PMH of Prader-Willi, incarcerated left anterior diaphragmatic hernia, who presented with abdominal pain, nausea, vomiting and was admitted for small bowel obstruction and operative repair of hernia.   Small bowel obstruction Incarcerated left anterior diaphragmatic hernia Status postoperative repair on 03/26/22 Continues to have abdominal pain and diarrhea.  Exam notable for involuntary guarding.  Incisions look clean dry and intact.  Stable leukocytosis and mild normocytic anemia.  Awaiting CT A/P.  I have some concern for perforation, bowel injury, intra-abdominal infection.  She continues to have an appetite and eat most of her meals, which is reassuring.  Differential also includes fecal impaction, peritoneal irritation secondary to insufflation for laparoscopic surgery. - Follow-up CT A/P - Continue acetaminophen 1000 mg p.o. every 6 hours - Continue oxycodone 5 to 10 mg p.o. every 4 hours as needed for  moderate pain, severe pain, breakthrough pain  Tachycardia Persistently elevated to 100-120s since transfer from ICU.  Did not correspond with start of abdominal pain.  Heart rate came down with a 500 cc fluid challenge this morning.  Thus I am not convinced this is a sign of developing infection.  Rather, I suspect dehydration may be playing a role. - Discontinue Lasix.   - Repeat LR bolus 500 cc for total of 1 L today  History of hypertension MAP stable in the 80s  Hyperlipidemia - Continue atorvastatin 40 mg daily  Hypothyroidism TSH 0.474 on admission - Continue Synthroid 137 mcg daily  Vitamin D deficiency - Continue cholecalciferol  Anxiety - Continue home alprazolam, BuSpar, citalopram  Diet:  Soft IVF: LR,Bolus 1L total  today VTE: Enoxaparin Code: Full Family Update: Mother at bedside   Dispo: Anticipated discharge to  Group home  pending workup and treatment of post-operative abdominal pain.   Marrianne Mood MD 03/16/2022, 1:22 PM  Pager: [phone] After 5pm on weekdays and 1pm on weekends: On Call pager: [phone]

## 2022-03-17 ENCOUNTER — Inpatient Hospital Stay (HOSPITAL_COMMUNITY): Payer: Medicare Other

## 2022-03-17 DIAGNOSIS — Q790 Congenital diaphragmatic hernia: Secondary | ICD-10-CM | POA: Diagnosis not present

## 2022-03-17 DIAGNOSIS — Q8711 Prader-Willi syndrome: Secondary | ICD-10-CM

## 2022-03-17 DIAGNOSIS — I3139 Other pericardial effusion (noninflammatory): Secondary | ICD-10-CM

## 2022-03-17 DIAGNOSIS — D72829 Elevated white blood cell count, unspecified: Secondary | ICD-10-CM | POA: Diagnosis not present

## 2022-03-17 DIAGNOSIS — R6889 Other general symptoms and signs: Secondary | ICD-10-CM

## 2022-03-17 LAB — DIFFERENTIAL

## 2022-03-17 LAB — BASIC METABOLIC PANEL
Anion gap: 7 (ref 5–15)
BUN: 15 mg/dL (ref 6–20)
CO2: 28 mmol/L (ref 22–32)
Calcium: 8.7 mg/dL — ABNORMAL LOW (ref 8.9–10.3)
Chloride: 97 mmol/L — ABNORMAL LOW (ref 98–111)
Creatinine, Ser: 0.54 mg/dL (ref 0.44–1.00)
GFR, Estimated: >60 mL/min (ref >60)
Glucose, Bld: 190 mg/dL — ABNORMAL HIGH (ref 70–99)
Potassium: 3.3 mmol/L — ABNORMAL LOW (ref 3.5–5.1)
Sodium: 132 mmol/L — ABNORMAL LOW (ref 135–145)

## 2022-03-17 LAB — CBC
HCT: 30.3 % — ABNORMAL LOW (ref 36.0–46.0)
Hemoglobin: 9.9 g/dL — ABNORMAL LOW (ref 12.0–15.0)
MCH: 30.7 pg (ref 26.0–34.0)
MCHC: 32.7 g/dL (ref 30.0–36.0)
MCV: 94.1 fL (ref 80.0–100.0)
Platelets: 544 10*3/uL — ABNORMAL HIGH (ref 150–400)
RBC: 3.22 MIL/uL — ABNORMAL LOW (ref 3.87–5.11)
RDW: 14.2 % (ref 11.5–15.5)
WBC: 21.3 10*3/uL — ABNORMAL HIGH (ref 4.0–10.5)
nRBC: 0.1 % (ref 0.0–0.2)

## 2022-03-17 LAB — CBC WITH DIFFERENTIAL/PLATELET
Abs Immature Granulocytes: 0 10*3/uL (ref 0.00–0.07)
Basophils Absolute: 0 10*3/uL (ref 0.0–0.1)
Basophils Relative: 0 %
Eosinophils Absolute: 0.9 10*3/uL — ABNORMAL HIGH (ref 0.0–0.5)
Eosinophils Relative: 4 %
HCT: 30.4 % — ABNORMAL LOW (ref 36.0–46.0)
Hemoglobin: 9.9 g/dL — ABNORMAL LOW (ref 12.0–15.0)
Lymphocytes Relative: 9 %
Lymphs Abs: 2 10*3/uL (ref 0.7–4.0)
MCH: 30.9 pg (ref 26.0–34.0)
MCHC: 32.6 g/dL (ref 30.0–36.0)
MCV: 95 fL (ref 80.0–100.0)
Monocytes Absolute: 0.4 10*3/uL (ref 0.1–1.0)
Monocytes Relative: 2 %
Neutro Abs: 18.4 10*3/uL — ABNORMAL HIGH (ref 1.7–7.7)
Neutrophils Relative %: 85 %
Platelets: 546 10*3/uL — ABNORMAL HIGH (ref 150–400)
RBC: 3.2 MIL/uL — ABNORMAL LOW (ref 3.87–5.11)
RDW: 14.1 % (ref 11.5–15.5)
WBC: 21.7 10*3/uL — ABNORMAL HIGH (ref 4.0–10.5)
nRBC: 0 /100 WBC
nRBC: 0.1 % (ref 0.0–0.2)

## 2022-03-17 LAB — PREALBUMIN: Prealbumin: 19 mg/dL (ref 18–38)

## 2022-03-17 LAB — ECHOCARDIOGRAM LIMITED
Height: 56 in
Weight: 2800 oz

## 2022-03-17 MED ORDER — ALTEPLASE 2 MG IJ SOLR
2.0000 mg | Freq: Once | INTRAMUSCULAR | Status: AC
  Administered 2022-03-17: 2 mg
  Filled 2022-03-17: qty 2

## 2022-03-17 MED ORDER — POTASSIUM CHLORIDE 20 MEQ PO PACK
20.0000 meq | PACK | Freq: Two times a day (BID) | ORAL | Status: AC
Start: 2022-03-17 — End: 2022-03-17
  Administered 2022-03-17: 20 meq via ORAL
  Filled 2022-03-17: qty 1

## 2022-03-17 NOTE — Progress Notes (Signed)
Subjective:   Hospital day 11.  Interval events: Fall without injury.  Her diffuse abdominal pain is persistent.  She wonders if she "tore" something.  When asked, she endorses shaking and chills overnight.  She reports the passage of some solid green stool.  She still has an appetite and is finishing most of her meals.  She reports generalized weakness.  She denies nausea, vomiting.  She denies chest pain, shortness of breath.  Objective:  Vital signs: Blood pressure 126/78, pulse 91, temperature 98 F (36.7 C), temperature source Axillary, resp. rate 20, height 4\' 8"  (1.422 m), weight 79.4 kg, SpO2 95 %.  Physical exam: Constitutional: Female sitting upright in the bed.  In no apparent acute distress. Cardiovascular: Regular rate and rhythm. Pulmonary: No increased work of breathing noted. Abdominal: Diffusely tender to palpation.  Firm.  Nondistended. Skin: Abdominal incisions appear clean, dry, and intact.  No erythema or tenderness around central line.  No new rashes apparent on visible skin. Extremities: Left radial pulse 2+. Neuro: Alert. Psych: Pleasant.  Appropriate mood and affect.   Intake/Output Summary (Last 24 hours) at 03/17/2022 1113 Last data filed at 03/17/2022 0537 Gross per 24 hour  Intake 740.56 ml  Output 1010 ml  Net -269.44 ml    Pertinent Labs:    Latest Ref Rng & Units 03/17/2022    9:00 AM 03/17/2022    4:19 AM 03/16/2022    3:37 AM  CBC  WBC 4.0 - 10.5 K/uL 21.7  21.3  17.7   Hemoglobin 12.0 - 15.0 g/dL 9.9  9.9  03/18/2022   Hematocrit 36.0 - 46.0 % 30.4  30.3  30.8   Platelets 150 - 400 K/uL 546  544  497    Differential shows a neutrophilic predominance.     Latest Ref Rng & Units 03/17/2022    4:19 AM 03/16/2022    3:37 AM 03/14/2022    4:37 AM  CMP  Glucose 70 - 99 mg/dL 03/16/2022  762  831   BUN 6 - 20 mg/dL 15  17  11    Creatinine 0.44 - 1.00 mg/dL 517   6.16   Sodium 135 - 145 mmol/L 132  135  134   Potassium 3.5 -  5.1 mmol/L 3.3  3.5  3.8   Chloride 98 - 111 mmol/L 97  98  101   CO2 22 - 32 mmol/L 28  29  29    Calcium 8.9 - 10.3 mg/dL 8.7  8.7  8.6    Blood cultures pending.  UA and urine culture pending.  Imaging: CT A/P with IV/PO contrast: No evidence of postoperative fluid collections within the abdomen or pelvis.  Moderate to large pericardial effusion.  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 who was admitted for mall bowel obstruction secondary to incarcerated anterior diaphragmatic hernia.  She underwent operative repair on 03/14/2022 and had resolution of her SBO.  Now with rigors and leukocytosis.  Leukocytosis Rigors Patient reports shaking chills overnight on review of systems.  Skin around incisions, drains, and lines looks normal.  Leukocytes continue to trend upward.  Differential shows a predominance of neutrophils.  Concerned that 4 g of Tylenol per day may be masking fevers.  Suspect infection but source is unknown. - Follow-up blood cultures - Follow-up urinalysis and urine cultures  Pericardial effusion Small pericardial effusion noted to be 0.7 cm in size on TTE 03/10/2022.  Pericardial effusion on CT estimated to be up to 2.5 cm.  Patient is asymptomatic and hemodynamically stable.  I suspect that this is due to her operation, which required the surgeon to free adhesions from the patient's pericardium.  We will continue to monitor while she is hospitalized, especially as long as we are concerned for systemic infection.  No indication now for pericardiocentesis. - Follow-up TTE  Tachycardia,  improved Status post 1 L bolus yesterday.  Heart rate stable in the 80s to 90s overnight.  Could be a result of dehydration.  Abdominal pain, stable Small bowel obstruction, resolved Incarcerated anterior diaphragmatic hernia, status post laparoscopic repair on 03/04/2022 Stable from day-to-day now for 3 days.  Pain is diffuse.  Abdominal exam is reassuring.  CT A/P is also reassuring.  I am less concerned about an intra-abdominal process driving this patient's current clinical syndrome. - Continue acetaminophen 1000 mg p.o. every 6 hours  History of hypertension MAP stable in the 80s  Hyperlipidemia - Continue atorvastatin 40 mg daily  Hypothyroidism TSH 0.474 on admission - Continue Synthroid 137 mcg daily  Vitamin D deficiency - Continue cholecalciferol  Anxiety - Continue home alprazolam, BuSpar, citalopram  Diet:  Soft IVF: None,None VTE: Enoxaparin Code: Full PT/OT recs: Wheelchair and hospital bed.   Dispo: Anticipated discharge to  Group home  pending evaluation and treatment of acute medical conditions.   Marrianne Mood MD 03/17/2022, 11:13 AM  Pager: [phone] After 5pm on weekdays and 1pm on weekends: On Call pager: [phone]

## 2022-03-17 NOTE — Progress Notes (Signed)
  Echocardiogram 2D Echocardiogram has been performed.  Barbara Ward F 03/17/2022, 11:20 AM

## 2022-03-17 NOTE — Progress Notes (Signed)
9 Days Post-Op  Subjective: CC: Patient with continued generalized abdominal pain. Tolerating diet. No n/v. Having bm's. Reports fall yesterday when getting out of chair. Reports both of her legs felt weak. No head trauma or loc. She is not sure if she has any new areas of pain.   Objective: Vital signs in last 24 hours: Temp:  [97.6 F (36.4 C)-98.2 F (36.8 C)] 98 F (36.7 C) (07/27 0821) Pulse Rate:  [91-108] 91 (07/27 0821) Resp:  [18-20] 20 (07/27 0821) BP: (100-126)/(73-86) 126/78 (07/27 0821) SpO2:  [90 %-99 %] 95 % (07/27 0821) Last BM Date : 03/16/22  Intake/Output from previous day: 07/26 0701 - 07/27 0700 In: 740.6 [P.O.:240; IV Piggyback:500.6] Out: 1010 [Urine:700; Drains:310] Intake/Output this shift: No intake/output data recorded.  PE: Gen:  Alert, NAD, pleasant Card:  Reg, no obvious murmur  Pulm:  Rate and effort normal. CTA b/l. On 2L Abd: Soft, mild distension, generalized ttp that is stable. No rigidity or guarding, +BS, incisions with steri-strips intact appears well and are without drainage, bleeding, or signs of infection. JP SS, 310cc/24 hours Ext: Chronic lymphedema. No calf ttp. MAEs  Lab Results:  Recent Labs    03/17/22 0419 03/17/22 0900  WBC 21.3* 21.7*  HGB 9.9* 9.9*  HCT 30.3* 30.4*  PLT 544* 546*   BMET Recent Labs    03/16/22 0337 03/17/22 0419  NA 135 132*  K 3.5 3.3*  CL 98 97*  CO2 29 28  GLUCOSE 125* 190*  BUN 17 15  CREATININE 0.60 0.54  CALCIUM 8.7* 8.7*   PT/INR No results for input(s): "LABPROT", "INR" in the last 72 hours. CMP     Component Value Date/Time   NA 132 (L) 03/17/2022 0419   K 3.3 (L) 03/17/2022 0419   CL 97 (L) 03/17/2022 0419   CO2 28 03/17/2022 0419   GLUCOSE 190 (H) 03/17/2022 0419   BUN 15 03/17/2022 0419   CREATININE 0.54 03/17/2022 0419   CALCIUM 8.7 (L) 03/17/2022 0419   PROT 4.9 (L) 03/09/2022 2307   ALBUMIN 2.4 (L) 03/09/2022 2307   AST 31 03/09/2022 2307   ALT 35 03/09/2022  2307   ALKPHOS 60 03/09/2022 2307   BILITOT 0.9 03/09/2022 2307   GFRNONAA >60 03/17/2022 0419   GFRAA >60 03/04/2018 0427   Lipase     Component Value Date/Time   LIPASE 24 02/19/2022 1108    Studies/Results: CT ABDOMEN PELVIS W CONTRAST  Result Date: 03/16/2022 CLINICAL DATA:  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 EXAM: CT ABDOMEN AND PELVIS WITH CONTRAST TECHNIQUE: Multidetector CT imaging of the abdomen and pelvis was performed using the standard protocol following bolus administration of intravenous contrast. RADIATION DOSE REDUCTION: This exam was performed according to the departmental dose-optimization program which includes automated exposure control, adjustment of the mA and/or kV according to patient size and/or use of iterative reconstruction technique. CONTRAST:  OMNIPAQUE IOHEXOL 300 MG/ML  SOLN COMPARISON:  02/28/2022 FINDINGS: Lower Chest: New moderate to large pericardial effusion is seen. Increased atelectasis and tiny pleural effusion noted in the right lung base. Hepatobiliary: No liver lesions identified. Gallbladder is unremarkable. No evidence of biliary ductal dilatation. Pancreas:  No mass or inflammatory changes. Spleen: Within normal limits in size and appearance. Adrenals/Urinary Tract: No masses identified. No evidence of ureteral calculi or hydronephrosis. Stomach/Bowel: Previously seen right-sided Morgagni hernia is no longer visualized, and surgical drain is seen in place. No abnormal fluid collections seen within the upper  abdomen. No evidence of obstruction, inflammatory process or abnormal fluid collections. Vascular/Lymphatic: No pathologically enlarged lymph nodes. No acute vascular findings. Reproductive:  No mass or other significant abnormality. Other: Increased edema seen throughout the abdominal wall subcutaneous fat. Musculoskeletal:  No suspicious bone lesions identified. IMPRESSION: Interval repair of right-sided Morgagni  hernia since prior study. No evidence of postop fluid collections or other complication within the abdomen or pelvis. New moderate to large pericardial effusion. Increased right basilar atelectasis and tiny right pleural effusion. Electronically Signed   By: Danae Orleans M.D.   On: 03/16/2022 14:12   DG Abd 1 View  Result Date: 03/15/2022 CLINICAL DATA:  51 year old female presenting with abdominal pain and pain all over. EXAM: ABDOMEN - 1 VIEW COMPARISON:  March 08, 2022 FINDINGS: Since the previous exam there has been interval removal of the gastric tube. EKG leads project over the abdomen. Airspace disease at the lung bases is grossly similar to previous imaging. Bowel gas pattern without gross signs of obstruction. Moderate stool throughout the colon. Stool and gas in the rectum. On limited assessment no acute regional skeletal process. Extensive subcutaneous edema is suggested. Perhaps some residual subcutaneous emphysema particularly along the LEFT flank. Suspect improvement of subcutaneous emphysema seen previously. IMPRESSION: 1. Moderate stool throughout the colon. No signs of bowel obstruction. 2. Airspace disease at the lung bases grossly similar to previous imaging. 3. Subcutaneous edema and decreased subcutaneous emphysema grossly when compared to previous imaging, not well assessed. 4. Bibasilar airspace disease partially imaged. Electronically Signed   By: Donzetta Kohut M.D.   On: 03/15/2022 11:34    Anti-infectives: Anti-infectives (From admission, onward)    Start     Dose/Rate Route Frequency Ordered Stop   03/03/2022 1230  vancomycin (VANCOREADY) IVPB 1500 mg/300 mL        1,500 mg 150 mL/hr over 120 Minutes Intravenous On call to O.R. 03/07/22 2150 03/16/2022 1425   03/11/2022 0600  gentamicin (GARAMYCIN) 300 mg in dextrose 5 % 100 mL IVPB       See Hyperspace for full Linked Orders Report.   300 mg 107.5 mL/hr over 60 Minutes Intravenous On call to O.R. 03/07/22 1321 02/25/2022 1430    02/24/2022 0600  Vancomycin (VANCOCIN) 1,500 mg in sodium chloride 0.9 % 500 mL IVPB  Status:  Discontinued        1,500 mg 250 mL/hr over 120 Minutes Intravenous  Once 03/07/22 1424 03/07/22 2154   03/07/22 1415  Vancomycin (VANCOCIN) 1,500 mg in sodium chloride 0.9 % 500 mL IVPB  Status:  Discontinued       See Hyperspace for full Linked Orders Report.   1,500 mg 250 mL/hr over 120 Minutes Intravenous  Once 03/07/22 1321 03/07/22 1425        Assessment/Plan POD 9, s/p  ROBOTIC RECURRENT ANTERIOR DIAPHRAGMATIC HERNIA REPAIR WITH MESH ROBOTIC LYSIS OF ADHESIONS by Dr. Michaell Cowing 7/18 - D3 diet - Keep JP drain until at least POD10 - Mobilize, therapies - IS, pulm toilet - CT with interval repair of right-sided Morgagni hernia with no evidence of postop fluid collections or other complication within the abdomen or pelvis. There is a new moderate to large pericardial effusion. Discussed with primary team who is planning echo and possible cards consult.     FEN - D3 diet, IVF per primary VTE - Lovenox ID - None currently.    - Per primary team -  Fall 7/26 - denies head trauma or loc. Notified primary team.  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

## 2022-03-18 DIAGNOSIS — R0682 Tachypnea, not elsewhere classified: Secondary | ICD-10-CM | POA: Diagnosis not present

## 2022-03-18 DIAGNOSIS — E785 Hyperlipidemia, unspecified: Secondary | ICD-10-CM

## 2022-03-18 DIAGNOSIS — R000 Tachycardia, unspecified: Secondary | ICD-10-CM

## 2022-03-18 DIAGNOSIS — I3139 Other pericardial effusion (noninflammatory): Secondary | ICD-10-CM

## 2022-03-18 DIAGNOSIS — E559 Vitamin D deficiency, unspecified: Secondary | ICD-10-CM

## 2022-03-18 DIAGNOSIS — Q790 Congenital diaphragmatic hernia: Secondary | ICD-10-CM | POA: Diagnosis not present

## 2022-03-18 DIAGNOSIS — E039 Hypothyroidism, unspecified: Secondary | ICD-10-CM

## 2022-03-18 LAB — CBC
HCT: 26.8 % — ABNORMAL LOW (ref 36.0–46.0)
Hemoglobin: 8.8 g/dL — ABNORMAL LOW (ref 12.0–15.0)
MCH: 31.1 pg (ref 26.0–34.0)
MCHC: 32.8 g/dL (ref 30.0–36.0)
MCV: 94.7 fL (ref 80.0–100.0)
Platelets: 547 10*3/uL — ABNORMAL HIGH (ref 150–400)
RBC: 2.83 MIL/uL — ABNORMAL LOW (ref 3.87–5.11)
RDW: 14.1 % (ref 11.5–15.5)
WBC: 20.4 10*3/uL — ABNORMAL HIGH (ref 4.0–10.5)
nRBC: 0.1 % (ref 0.0–0.2)

## 2022-03-18 LAB — URINALYSIS, ROUTINE W REFLEX MICROSCOPIC
Bilirubin Urine: NEGATIVE
Glucose, UA: NEGATIVE mg/dL
Hgb urine dipstick: NEGATIVE
Ketones, ur: NEGATIVE mg/dL
Nitrite: POSITIVE — AB
Protein, ur: NEGATIVE mg/dL
Specific Gravity, Urine: 1.023 (ref 1.005–1.030)
pH: 6 (ref 5.0–8.0)

## 2022-03-18 LAB — BASIC METABOLIC PANEL
Anion gap: 6 (ref 5–15)
BUN: 20 mg/dL (ref 6–20)
CO2: 28 mmol/L (ref 22–32)
Calcium: 8.6 mg/dL — ABNORMAL LOW (ref 8.9–10.3)
Chloride: 98 mmol/L (ref 98–111)
Creatinine, Ser: 0.58 mg/dL (ref 0.44–1.00)
GFR, Estimated: >60 mL/min (ref >60)
Glucose, Bld: 110 mg/dL — ABNORMAL HIGH (ref 70–99)
Potassium: 4.4 mmol/L (ref 3.5–5.1)
Sodium: 132 mmol/L — ABNORMAL LOW (ref 135–145)

## 2022-03-18 MED ORDER — SODIUM CHLORIDE 0.9 % IV SOLN
2.0000 g | INTRAVENOUS | Status: AC
  Administered 2022-03-18 – 2022-03-20 (×3): 2 g via INTRAVENOUS
  Filled 2022-03-18 (×3): qty 20

## 2022-03-18 MED ORDER — SULFAMETHOXAZOLE-TRIMETHOPRIM 800-160 MG PO TABS
1.0000 | ORAL_TABLET | Freq: Two times a day (BID) | ORAL | Status: DC
  Administered 2022-03-18: 1 via ORAL
  Filled 2022-03-18 (×2): qty 1

## 2022-03-18 NOTE — Progress Notes (Signed)
Patient refused for ambulation.MD notified

## 2022-03-18 NOTE — Progress Notes (Signed)
PT Cancellation Note  Patient Details Name: Barbara Ward MRN: [id number] DOB: 23-Mar-1971   Cancelled Treatment:    Reason Eval/Treat Not Completed: Other (comment).  Multiple attempts with pt on bedpans, very tired afterward.  Follow up at another time.   Ivar Drape 03/18/2022, 3:10 PM  Samul Dada, PT PhD Acute Rehab Dept. Number: [facility] [id number] and [facility] [phone]

## 2022-03-18 NOTE — Consult Note (Addendum)
Cardiology Consultation:   Patient ID: Barbara Ward MRN: [id number]; DOB: August 29, 1970  Admit date: 03-28-22 Date of Consult: 03/18/2022  PCP:  Hurshel Party, NP   [facility] Providers Cardiologist:  Olga Millers, MD new  Patient Profile:   Barbara Ward is a 51 y.o. female with a hx of recurrent diaphragmatic hernia, Prader-Willi syndrome, intellectual disability, hyperlipidemia, OSA, and thyroid disease who is being seen 03/18/2022 for the evaluation of pericardial effusion at the request of Dr. Oswaldo Done.  History of Present Illness:   Ms. Ayuso the above history presented with recurrent diaphragmatic hernia found to have a SBO.  She was admitted with attempt to decompress with NG tube.  She proceeded to the OR for robotic repair with mesh on 03/17/2022.  Surgery included lysis of adhesions, which may have irritated pericardium. There were significant mediastinal adhesions with involvement of the right pleural and 50% into the left pleura that required transection. Due to hx of murmur, echocardiogram was obtained no 03/10/22 that showed a small pericardial effusion posterior and lateral to left ventricle without tamponade. A limited echo repeated 03/17/22 with enlarging pericardial effusion without tamponade - moderate effusion, IVC not dilated, and no RV diastolic collapse. Cardiology was consulted.   Interview conducted with patient and mother at bedside. Pt resides in a group home and attends a day program. She walks with a Rolator and has chronic stable lymphedema.   They report no recent viral illnesses. She denies chest pain but has intermittent shortness of breath requiring supplemental O2 during my exam. Her only complaint is soreness on her abdomen related to surgery.    Past Medical History:  Diagnosis Date   Allergic rhinitis    Hyperlipidemia    Hypokalemia    MR (mental retardation)    Prader-Willi syndrome    Sleep apnea    Thyroid disease     Past Surgical  History:  Procedure Laterality Date   ESOPHAGOGASTRODUODENOSCOPY (EGD) WITH PROPOFOL N/A 02/25/2018   Procedure: ESOPHAGOGASTRODUODENOSCOPY (EGD) WITH PROPOFOL;  Surgeon: Kathi Der, MD;  Location: [facility];  Service: Gastroenterology;  Laterality: N/A;   HERNIA REPAIR     HIATAL HERNIA REPAIR N/A 02/27/2018   Procedure: LAPAROSCOPIC REPAIR OF HIATAL HERNIA;  Surgeon: Ovidio Kin, MD;  Location: [facility] OR;  Service: General;  Laterality: N/A;   LAPAROSCOPIC GASTROSTOMY N/A 02/27/2018   Procedure: LAPAROSCOPIC GASTROSTOMY;  Surgeon: Ovidio Kin, MD;  Location: [facility] OR;  Service: General;  Laterality: N/A;   THYROIDECTOMY     XI ROBOTIC ASSISTED HIATAL HERNIA REPAIR N/A 03/09/2022   Procedure: XI ROBOTIC ASSISTED RECURRENT DIAPHRAGMATIC HERNIA REPAIR;  Surgeon: Karie Soda, MD;  Location: [facility] OR;  Service: General;  Laterality: N/A;     Home Medications:  Prior to Admission medications   Medication Sig Start Date End Date Taking? Authorizing Provider  albuterol (VENTOLIN HFA) 108 (90 Base) MCG/ACT inhaler Inhale 2 puffs into the lungs every 6 (six) hours as needed for wheezing or shortness of breath.   Yes [provider]  ALPRAZolam (XANAX) 0.25 MG tablet Take 0.25 mg by mouth every 12 (twelve) hours.   Yes [provider]  atorvastatin (LIPITOR) 40 MG tablet Take 1 tablet (40 mg total) by mouth daily at 6 PM. 03/06/18  Yes Riccio, Angela C, DO  busPIRone (BUSPAR) 15 MG tablet Take 15 mg by mouth 2 (two) times daily.   Yes [provider]  busPIRone (BUSPAR) 15 MG tablet Take 15 mg by mouth 3 (three) times daily.  Yes [provider]  Cholecalciferol (VITAMIN D3) 50 MCG (2000 UT) capsule Take 2,000 Units by mouth daily.   Yes [provider]  citalopram (CELEXA) 40 MG tablet Take 40 mg by mouth daily.   Yes [provider]  docusate sodium (COLACE) 100 MG capsule Take 100 mg by mouth every evening.   Yes [provider]   fluticasone (FLONASE) 50 MCG/ACT nasal spray Place 1 spray into both nostrils every evening.    Yes [provider]  furosemide (LASIX) 40 MG tablet Take 40 mg by mouth daily.   Yes [provider]  glucosamine-chondroitin 500-400 MG tablet Take 1 tablet by mouth every morning.   Yes [provider]  levothyroxine (SYNTHROID, LEVOTHROID) 137 MCG tablet Take 137 mcg by mouth daily before breakfast.   Yes [provider]  loratadine (CLARITIN) 10 MG tablet Take 10 mg by mouth every morning.   Yes [provider]  Multiple Vitamin (MULTIVITAMIN WITH MINERALS) TABS tablet Take 1 tablet by mouth every morning.   Yes [provider]  potassium chloride (K-DUR) 10 MEQ tablet Take 10 mEq by mouth every morning.   Yes [provider]  PSYLLIUM FIBER PO Take 1 tablet by mouth in the morning and at bedtime.   Yes [provider]  ramipril (ALTACE) 10 MG capsule Take 10 mg by mouth daily.   Yes [provider]  Zinc Oxide (DESITIN RAPID RELIEF) 13 % CREA Apply 1 Application topically daily as needed (Apply to buttock and genital area after using restroom).   Yes [provider]  acetaminophen (TYLENOL) 500 MG tablet Take 1 tablet (500 mg total) by mouth every 6 (six) hours for 10 days. 03/15/22 03/25/22  Marrianne Mood, MD    Inpatient Medications: Scheduled Meds:  acetaminophen  1,000 mg Oral Q6H   ALPRAZolam  0.25 mg Oral BID   atorvastatin  40 mg Oral q1800   busPIRone  15 mg Oral BID   Chlorhexidine Gluconate Cloth  6 each Topical Daily   cholecalciferol  2,000 Units Oral Daily   citalopram  40 mg Oral Daily   enoxaparin (LOVENOX) injection  40 mg Subcutaneous Q24H   fluticasone  1 spray Each Nare QPM   levothyroxine  137 mcg Oral Q0600   lip balm   Topical BID   methocarbamol  500 mg Oral TID   polyethylene glycol  17 g Oral Daily   senna  1 tablet Oral BID   sodium chloride flush  10-40 mL Intracatheter  Q12H   sulfamethoxazole-trimethoprim  1 tablet Oral Q12H   Continuous Infusions:  sodium chloride Stopped (03/10/22 1128)   ondansetron (ZOFRAN) IV     PRN Meds: albuterol, alum & mag hydroxide-simeth, bisacodyl, fentaNYL (SUBLIMAZE) injection, magic mouthwash, menthol-cetylpyridinium, ondansetron (ZOFRAN) IV **OR** ondansetron (ZOFRAN) IV, mouth rinse, oxyCODONE, phenol, prochlorperazine, simethicone, sodium chloride flush  Allergies:    Allergies  Allergen Reactions   Codeine Other (See Comments)    Unknown reaction   Penicillins Other (See Comments)    Unknown reaction   Tape Other (See Comments)    "surgical tape" - unknown reaction    Social History:   Social History   Socioeconomic History   Marital status: Single    Spouse name: Not on file   Number of children: Not on file   Years of education: Not on file   Highest education level: Not on file  Occupational History   Not on file  Tobacco Use  Smoking status: Never   Smokeless tobacco: Never  Substance and Sexual Activity   Alcohol use: No   Drug use: No   Sexual activity: Not on file  Other Topics Concern   Not on file  Social History Narrative   Not on file   Social Determinants of Health   Financial Resource Strain: Not on file  Food Insecurity: Not on file  Transportation Needs: Not on file  Physical Activity: Not on file  Stress: Not on file  Social Connections: Not on file  Intimate Partner Violence: Not on file    Family History:   History reviewed. No pertinent family history.   ROS:  Please see the history of present illness.   All other ROS reviewed and negative.     Physical Exam/Data:   Vitals:   03/17/22 2058 03/18/22 0013 03/18/22 0602 03/18/22 0756  BP:  (!) 92/56 116/64 111/70  Pulse:  (!) 101 92 100  Resp: Temp:  98.3 F (36.8 C) 98 F (36.7 C) 98 F (36.7 C)  TempSrc:  Axillary Oral Oral  SpO2:  95% 98% 92%  Weight:      Height:        Intake/Output  Summary (Last 24 hours) at 03/18/2022 1216 Last data filed at 03/18/2022 1100 Gross per 24 hour  Intake --  Output 430 ml  Net -430 ml      02/20/2022   11:01 AM 02/28/2018   10:01 AM  Last 3 Weights  Weight (lbs) 175 lb 175 lb  Weight (kg) 79.379 kg 79.379 kg     Body mass index is 39.23 kg/m.  Exam per physician   EKG:  The EKG was personally reviewed and demonstrates:  sinus HR 96, TWI inferior leads Telemetry:  Telemetry was personally reviewed and demonstrates:  sinus tachycardia HR in the 100s  Relevant CV Studies:  Limited echo 03/17/22:  1. Left ventricular ejection fraction, by estimation, is 55 to 60%. The  left ventricle has normal function. The left ventricle has no regional  wall motion abnormalities.   2. Right ventricular systolic function is mildly reduced. The right  ventricular size is normal. Tricuspid regurgitation signal is inadequate  for assessing PA pressure.   3. The inferior vena cava is normal in size with <50% respiratory  variability, suggesting right atrial pressure of 8 mmHg.   4. < 25% respirophasic variation of mitral valve E inflow velocity. No RV  diastolic collapse noted. The IVC is not significantly dilated. No  evidence for tamponade though the pericardial effusion is larger than on  the prior study. Moderate pericardial  effusion. The pericardial effusion is circumferential.   Laboratory Data:  High Sensitivity Troponin:   Recent Labs  Lab 03/10/22 0815 03/10/22 1004  TROPONINIHS 220* 222*     Chemistry Recent Labs  Lab 03/16/22 0337 03/17/22 0419 03/18/22 0328  NA 135 132* 132*  K 3.5 3.3* 4.4  CL 98 97* 98  CO2 GLUCOSE 125* 190* 110*  BUN CREATININE 0.60 0.54 0.58  CALCIUM 8.7* 8.7* 8.6*  GFRNONAA >60 >60 >60  ANIONGAP No results for input(s): "PROT", "ALBUMIN", "AST", "ALT", "ALKPHOS", "BILITOT" in the last 168 hours. Lipids No results for input(s): "CHOL", "TRIG", "HDL", "LABVLDL",  "LDLCALC", "CHOLHDL" in the last 168 hours.  Hematology Recent Labs  Lab 03/17/22 0419 03/17/22 0900 03/18/22 0328  WBC 21.3* 21.7* 20.4*  RBC  3.22* 3.20* 2.83*  HGB 9.9* 9.9* 8.8*  HCT 30.3* 30.4* 26.8*  MCV 94.1 95.0 94.7  MCH 30.7 30.9 31.1  MCHC 32.7 32.6 32.8  RDW 14.2 14.1 14.1  PLT 544* 546* 547*   Thyroid No results for input(s): "TSH", "FREET4" in the last 168 hours.  BNPNo results for input(s): "BNP", "PROBNP" in the last 168 hours.  DDimer No results for input(s): "DDIMER" in the last 168 hours.   Radiology/Studies:  ECHOCARDIOGRAM LIMITED  Result Date: 03/17/2022    ECHOCARDIOGRAM LIMITED REPORT   Patient Name:   CHASIDY JANAK Date of Exam: 03/17/2022 Medical Rec #:  [id number]     Height:       56.0 in Accession #:    [id number]    Weight:       175.0 lb Date of Birth:  July 04, 1971     BSA:          1.677 m Patient Age:    51 years      BP:           101/38 mmHg Patient Gender: F             HR:           96 bpm. Exam Location:  Inpatient Procedure: Limited Echo Indications:    Pericardial effusion  History:        Patient has prior history of Echocardiogram examinations, most                 recent 03/10/2022. Risk Factors:Hypertension.  Sonographer:    Roosvelt Maser RDCS Referring Phys: Flossie Buffy AMPONSAH IMPRESSIONS  1. Left ventricular ejection fraction, by estimation, is 55 to 60%. The left ventricle has normal function. The left ventricle has no regional wall motion abnormalities.  2. Right ventricular systolic function is mildly reduced. The right ventricular size is normal. Tricuspid regurgitation signal is inadequate for assessing PA pressure.  3. The inferior vena cava is normal in size with <50% respiratory variability, suggesting right atrial pressure of 8 mmHg.  4. < 25% respirophasic variation of mitral valve E inflow velocity. No RV diastolic collapse noted. The IVC is not significantly dilated. No evidence for tamponade though the pericardial effusion is larger than on  the prior study. Moderate pericardial effusion. The pericardial effusion is circumferential. FINDINGS  Left Ventricle: Left ventricular ejection fraction, by estimation, is 55 to 60%. The left ventricle has normal function. The left ventricle has no regional wall motion abnormalities. Right Ventricle: The right ventricular size is normal. Right ventricular systolic function is mildly reduced. Tricuspid regurgitation signal is inadequate for assessing PA pressure. Pericardium: < 25% respirophasic variation of mitral valve E inflow velocity. No RV diastolic collapse noted. The IVC is not significantly dilated. No evidence for tamponade though the pericardial effusion is larger than on the prior study. A moderately sized pericardial effusion is present. The pericardial effusion is circumferential. Venous: The inferior vena cava is normal in size with less than 50% respiratory variability, suggesting right atrial pressure of 8 mmHg.  Diastology LV e' medial:  7.40 cm/s LV e' lateral: 9.03 cm/s  IVC IVC diam: 1.80 cm Dalton McleanMD Electronically signed by Wilfred Lacy Signature Date/Time: 03/17/2022/3:44:03 PM    Final    CT ABDOMEN PELVIS W CONTRAST  Result Date: 03/16/2022 CLINICAL DATA:  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 EXAM: CT ABDOMEN AND PELVIS WITH CONTRAST TECHNIQUE: Multidetector CT imaging of the abdomen and pelvis was performed using the standard  protocol following bolus administration of intravenous contrast. RADIATION DOSE REDUCTION: This exam was performed according to the departmental dose-optimization program which includes automated exposure control, adjustment of the mA and/or kV according to patient size and/or use of iterative reconstruction technique. CONTRAST:  100mL OMNIPAQUE IOHEXOL 300 MG/ML  SOLN COMPARISON:  02/26/2022 FINDINGS: Lower Chest: New moderate to large pericardial effusion is seen. Increased atelectasis and tiny pleural effusion noted in the  right lung base. Hepatobiliary: No liver lesions identified. Gallbladder is unremarkable. No evidence of biliary ductal dilatation. Pancreas:  No mass or inflammatory changes. Spleen: Within normal limits in size and appearance. Adrenals/Urinary Tract: No masses identified. No evidence of ureteral calculi or hydronephrosis. Stomach/Bowel: Previously seen right-sided Morgagni hernia is no longer visualized, and surgical drain is seen in place. No abnormal fluid collections seen within the upper abdomen. No evidence of obstruction, inflammatory process or abnormal fluid collections. Vascular/Lymphatic: No pathologically enlarged lymph nodes. No acute vascular findings. Reproductive:  No mass or other significant abnormality. Other: Increased edema seen throughout the abdominal wall subcutaneous fat. Musculoskeletal:  No suspicious bone lesions identified. IMPRESSION: Interval repair of right-sided Morgagni hernia since prior study. No evidence of postop fluid collections or other complication within the abdomen or pelvis. New moderate to large pericardial effusion. Increased right basilar atelectasis and tiny right pleural effusion. Electronically Signed   By: Danae OrleansJohn A Stahl M.D.   On: 03/16/2022 14:12   DG Abd 1 View  Result Date: 03/15/2022 CLINICAL DATA:  51 year old female presenting with abdominal pain and pain all over. EXAM: ABDOMEN - 1 VIEW COMPARISON:  March 08, 2022 FINDINGS: Since the previous exam there has been interval removal of the gastric tube. EKG leads project over the abdomen. Airspace disease at the lung bases is grossly similar to previous imaging. Bowel gas pattern without gross signs of obstruction. Moderate stool throughout the colon. Stool and gas in the rectum. On limited assessment no acute regional skeletal process. Extensive subcutaneous edema is suggested. Perhaps some residual subcutaneous emphysema particularly along the LEFT flank. Suspect improvement of subcutaneous emphysema seen  previously. IMPRESSION: 1. Moderate stool throughout the colon. No signs of bowel obstruction. 2. Airspace disease at the lung bases grossly similar to previous imaging. 3. Subcutaneous edema and decreased subcutaneous emphysema grossly when compared to previous imaging, not well assessed. 4. Bibasilar airspace disease partially imaged. Electronically Signed   By: Donzetta KohutGeoffrey  Wile M.D.   On: 03/15/2022 11:34     Assessment and Plan:   Pericardial effusion - denies history of recent viral illness - no pleuritic chest pain - repeat echo with now moderate pericardial effusion without physiological evidence of tamponade - pt is mildly tachycardic, but could be related to pain - shortness of breath and hypoxia requiring supplemental O2, could be related to extensive adhesions in pleura - pt is not in distress, will closely monitor for now - will repeat a limited echo next Monday/Tues - question if pericardium was irritated during adhesion removal - consider colchicine   Hyperlipidemia Continue 40 mg lipitor  Risk Assessment/Risk Scores:      For questions or updates, please contact [facility] Please consult [email or web address] for contact info under   Signed, Marcelino Dusterngela Nicole Duke, PA  03/18/2022 12:16 PM As above, patient seen and examined.  Briefly she is a 51 year old female with past medical history of Prader Johnnette GourdWilli syndrome, resides in a group home, hyperlipidemia, obstructive sleep apnea, status post recent repair of diaphragmatic hernia with associated SBO for evaluation of pericardial fusion.  Patient denies chest pain.  Minimal dyspnea.  Had repair of diaphragmatic hernia on July 18 which included lysis of adhesions.  Echocardiogram was performed on July 20 which showed a small pericardial effusion.  Patient subsequently had follow-up abdominal CT on July 26 that showed new moderate to large pericardial effusion.  Follow-up limited echocardiogram on July 27 showed moderate pericardial effusion  which was larger compared to previous.  No evidence of tamponade.  Cardiology now asked to evaluate. Electrocardiogram July 20 showed sinus rhythm with nonspecific ST changes. Creatinine 0.58, hemoglobin 8.8, white blood cell count 20.4.  1 pericardial effusion-etiology unclear.  Question irritation of the pericardium at the time of recent surgery for diaphragmatic hernia.  Initially was small and most recent echocardiogram showed moderate effusion.  She is not in tamponade clinically.  We will plan to repeat limited echocardiogram on Monday or Tuesday to reassess effusion.  Hopefully will not require pericardiocentesis.  No indication at present.  Other issues per primary service.  Olga Millers, MD

## 2022-03-18 NOTE — Progress Notes (Addendum)
Subjective:   Hospital day 12.  Interval events: No events.  Still complains of abdominal soreness.  Endorses increased urinary frequency.  Has some shortness of breath.  Still moving bowels.  Reluctant to move around in her room due to pain.  No chest pain.  No cough.  Objective:  Vital signs: Blood pressure 116/64, pulse 92, temperature 98 F (36.7 C), temperature source Oral, resp. rate 20, height 4\' 8"  (1.422 m), weight 79.4 kg, SpO2 98 %.  Physical exam: Constitutional: Female semireclined in bed. Cardiovascular: Tachycardia. Pulmonary: Tachypnea.  No audible wheezing. Abdominal: Diffuse tenderness. Skin: Warm and dry. Extremities: Left radial pulse 2+. Neuro: Alert and oriented. Psych: Pleasant.  Appropriate mood and affect.   Intake/Output Summary (Last 24 hours) at 03/18/2022 0640 Last data filed at 03/18/2022 0525 Gross per 24 hour  Intake --  Output 355 ml  Net -355 ml    Pertinent Labs:    Latest Ref Rng & Units 03/18/2022    3:28 AM 03/17/2022    9:00 AM 03/17/2022    4:19 AM  CBC  WBC 4.0 - 10.5 K/uL 20.4  21.7  21.3   Hemoglobin 12.0 - 15.0 g/dL 8.8  9.9  9.9   Hematocrit 36.0 - 46.0 % 26.8  30.4  30.3   Platelets 150 - 400 K/uL 547  546  544        Latest Ref Rng & Units 03/18/2022    3:28 AM 03/17/2022    4:19 AM 03/16/2022    3:37 AM  CMP  Glucose 70 - 99 mg/dL 03/18/2022  161  096   BUN 6 - 20 mg/dL 20  15  17    Creatinine 0.44 - 1.00 mg/dL 045   4.09   Sodium 135 - 145 mmol/L 132  132  135   Potassium 3.5 - 5.1 mmol/L 4.4  3.3  3.5   Chloride 98 - 111 mmol/L 98  97  98   CO2 22 - 32 mmol/L 28  28  29    Calcium 8.9 - 10.3 mg/dL 8.6  8.7  8.7    Urinalysis: Positive nitrate Many bacteria 21-50 WBCs Negative hemoglobin, RBCs  Urine cultures pending  Imaging: Limited echo: Moderate circumferential pericardial effusion No RV diastolic collapse Mildly depressed RV systolic function EF 55 to 60%  Assessment/Plan:    Principal Problem:   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 with a PMH of anterior diaphragmatic hernia status postoperative repair, with rigors and leukocytosis.  Found to have pyuria and bacteriuria.  Possible cystitis Patient with increased urinary frequency, rigors, leukocytosis, bacteriuria, and pyuria and systemic symptoms including leukocytosis and tachypnea.  Urine cultures pending. - Start ceftriaxone - Follow-up urine cultures.  Tachycardia Tachypnea Recent CT A/P showed R atelectasis but no sign of pneumonia in the lung bases.  Per Wells criteria she is at moderate risk of DVT and PE.  In this patient who has had difficulty mobilizing after her surgery due to pain, I suspect that general deconditioning may be playing a role.  For now we will treat her acute simple cystitis, encourage her to mobilize with  physical therapy, and reassess.  Circumferential pericardial effusion Hemodynamically stable.  I suspect this is secondary to manipulation of the patient's heart and pericardium during surgery.  Because this patient is hemodynamically stable I do not think there is an indication for pericardiocentesis.  Abdominal pain, stable Small bowel obstruction, resolved Incarcerated anterior diaphragmatic hernia, status post laparoscopic repair on 03/11/2022 Stable.  Pain is diffuse.  Abdominal exam is reassuring.  CT A/P is also reassuring.  I am less concerned about an intra-abdominal process driving this patient's current clinical syndrome. - Continue acetaminophen 1000 mg p.o. every 6 hours   History of hypertension MAP stable in the 80s  Hyperlipidemia - Continue  atorvastatin 40 mg daily  Hypothyroidism TSH 0.474 on admission - Continue Synthroid 137 mcg daily  Vitamin D deficiency - Continue cholecalciferol  Anxiety - Continue home alprazolam, BuSpar, citalopram  Diet: Normal IVF: None,None VTE: Enoxaparin Code: Full PT/OT recs: wheelchair and hospital bed. Family Update: mother at bedside  Dispo: Anticipated discharge to  Group home  pending evaluation and management of acute medical conditions.   Marrianne Mood MD 03/18/2022, 6:40 AM  Pager: [phone] After 5pm on weekdays and 1pm on weekends: On Call pager: [phone]

## 2022-03-18 NOTE — Progress Notes (Signed)
10 Days Post-Op   Chief Complaint/Subjective: Tolerating diet, still sore  Objective: Vital signs in last 24 hours: Temp:  [98 F (36.7 C)-98.3 F (36.8 C)] 98 F (36.7 C) (07/28 0756) Pulse Rate:  [92-101] 100 (07/28 0756) Resp:  [18-23] 20 (07/28 0756) BP: (92-116)/(56-70) 111/70 (07/28 0756) SpO2:  [92 %-98 %] 92 % (07/28 0756) Last BM Date : 03/17/22 Intake/Output from previous day: 07/27 0701 - 07/28 0700 In: -  Out: 355 [Drains:355] Intake/Output this shift: No intake/output data recorded.  PE: Gen: NAD Resp: nonlabored Card: RRR Abd: soft, drain with serous output  Lab Results:  Recent Labs    03/17/22 0900 03/18/22 0328  WBC 21.7* 20.4*  HGB 9.9* 8.8*  HCT 30.4* 26.8*  PLT 546* 547*   BMET Recent Labs    03/17/22 0419 03/18/22 0328  NA 132* 132*  K 3.3* 4.4  CL 97* 98  CO2 28 28  GLUCOSE 190* 110*  BUN 15 20  CREATININE 0.54 0.58  CALCIUM 8.7* 8.6*   PT/INR No results for input(s): "LABPROT", "INR" in the last 72 hours. CMP     Component Value Date/Time   NA 132 (L) 03/18/2022 0328   K 4.4 03/18/2022 0328   CL 98 03/18/2022 0328   CO2 28 03/18/2022 0328   GLUCOSE 110 (H) 03/18/2022 0328   BUN 20 03/18/2022 0328   CREATININE 0.58 03/18/2022 0328   CALCIUM 8.6 (L) 03/18/2022 0328   PROT 4.9 (L) 03/09/2022 2307   ALBUMIN 2.4 (L) 03/09/2022 2307   AST 31 03/09/2022 2307   ALT 35 03/09/2022 2307   ALKPHOS 60 03/09/2022 2307   BILITOT 0.9 03/09/2022 2307   GFRNONAA >60 03/18/2022 0328   GFRAA >60 03/04/2018 0427   Lipase     Component Value Date/Time   LIPASE 24 03/07/2022 1108    Studies/Results: ECHOCARDIOGRAM LIMITED  Result Date: 03/17/2022    ECHOCARDIOGRAM LIMITED REPORT   Patient Name:   Barbara Ward Date of Exam: 03/17/2022 Medical Rec #:  [id number]     Height:       56.0 in Accession #:    [id number]    Weight:       175.0 lb Date of Birth:  02-Sep-1970     BSA:          1.677 m Patient Age:    51 years      BP:            101/38 mmHg Patient Gender: F             HR:           96 bpm. Exam Location:  Inpatient Procedure: Limited Echo Indications:    Pericardial effusion  History:        Patient has prior history of Echocardiogram examinations, most                 recent 03/10/2022. Risk Factors:Hypertension.  Sonographer:    Roosvelt Maser RDCS Referring Phys: Flossie Buffy AMPONSAH IMPRESSIONS  1. Left ventricular ejection fraction, by estimation, is 55 to 60%. The left ventricle has normal function. The left ventricle has no regional wall motion abnormalities.  2. Right ventricular systolic function is mildly reduced. The right ventricular size is normal. Tricuspid regurgitation signal is inadequate for assessing PA pressure.  3. The inferior vena cava is normal in size with <50% respiratory variability, suggesting right atrial pressure of 8 mmHg.  4. < 25% respirophasic variation of mitral valve E  inflow velocity. No RV diastolic collapse noted. The IVC is not significantly dilated. No evidence for tamponade though the pericardial effusion is larger than on the prior study. Moderate pericardial effusion. The pericardial effusion is circumferential. FINDINGS  Left Ventricle: Left ventricular ejection fraction, by estimation, is 55 to 60%. The left ventricle has normal function. The left ventricle has no regional wall motion abnormalities. Right Ventricle: The right ventricular size is normal. Right ventricular systolic function is mildly reduced. Tricuspid regurgitation signal is inadequate for assessing PA pressure. Pericardium: < 25% respirophasic variation of mitral valve E inflow velocity. No RV diastolic collapse noted. The IVC is not significantly dilated. No evidence for tamponade though the pericardial effusion is larger than on the prior study. A moderately sized pericardial effusion is present. The pericardial effusion is circumferential. Venous: The inferior vena cava is normal in size with less than 50% respiratory  variability, suggesting right atrial pressure of 8 mmHg.  Diastology LV e' medial:  7.40 cm/s LV e' lateral: 9.03 cm/s  IVC IVC diam: 1.80 cm Dalton McleanMD Electronically signed by Wilfred Lacy Signature Date/Time: 03/17/2022/3:44:03 PM    Final    CT ABDOMEN PELVIS W CONTRAST  Result Date: 03/16/2022 CLINICAL DATA:  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 EXAM: CT ABDOMEN AND PELVIS WITH CONTRAST TECHNIQUE: Multidetector CT imaging of the abdomen and pelvis was performed using the standard protocol following bolus administration of intravenous contrast. RADIATION DOSE REDUCTION: This exam was performed according to the departmental dose-optimization program which includes automated exposure control, adjustment of the mA and/or kV according to patient size and/or use of iterative reconstruction technique. CONTRAST:  OMNIPAQUE IOHEXOL 300 MG/ML  SOLN COMPARISON:  03/13/2022 FINDINGS: Lower Chest: New moderate to large pericardial effusion is seen. Increased atelectasis and tiny pleural effusion noted in the right lung base. Hepatobiliary: No liver lesions identified. Gallbladder is unremarkable. No evidence of biliary ductal dilatation. Pancreas:  No mass or inflammatory changes. Spleen: Within normal limits in size and appearance. Adrenals/Urinary Tract: No masses identified. No evidence of ureteral calculi or hydronephrosis. Stomach/Bowel: Previously seen right-sided Morgagni hernia is no longer visualized, and surgical drain is seen in place. No abnormal fluid collections seen within the upper abdomen. No evidence of obstruction, inflammatory process or abnormal fluid collections. Vascular/Lymphatic: No pathologically enlarged lymph nodes. No acute vascular findings. Reproductive:  No mass or other significant abnormality. Other: Increased edema seen throughout the abdominal wall subcutaneous fat. Musculoskeletal:  No suspicious bone lesions identified. IMPRESSION: Interval  repair of right-sided Morgagni hernia since prior study. No evidence of postop fluid collections or other complication within the abdomen or pelvis. New moderate to large pericardial effusion. Increased right basilar atelectasis and tiny right pleural effusion. Electronically Signed   By: Danae Orleans M.D.   On: 03/16/2022 14:12    Anti-infectives: Anti-infectives (From admission, onward)    Start     Dose/Rate Route Frequency Ordered Stop   03/18/22 1215  sulfamethoxazole-trimethoprim (BACTRIM DS) 800-160 MG per tablet 1 tablet        1 tablet Oral Every 12 hours 03/18/22 1123 02/23/2022 0959   03/17/2022 1230  vancomycin (VANCOREADY) IVPB 1500 mg/300 mL        1,500 mg 150 mL/hr over 120 Minutes Intravenous On call to O.R. 03/07/22 2150 03/07/2022 1425   02/20/2022 0600  gentamicin (GARAMYCIN) 300 mg in dextrose 5 % 100 mL IVPB       See Hyperspace for full Linked Orders Report.   300 mg  107.5 mL/hr over 60 Minutes Intravenous On call to O.R. 03/07/22 1321 2022/03/22 1430   2022/03/22 0600  Vancomycin (VANCOCIN) 1,500 mg in sodium chloride 0.9 % 500 mL IVPB  Status:  Discontinued        1,500 mg 250 mL/hr over 120 Minutes Intravenous  Once 03/07/22 1424 03/07/22 2154   03/07/22 1415  Vancomycin (VANCOCIN) 1,500 mg in sodium chloride 0.9 % 500 mL IVPB  Status:  Discontinued       See Hyperspace for full Linked Orders Report.   1,500 mg 250 mL/hr over 120 Minutes Intravenous  Once 03/07/22 1321 03/07/22 1425       Assessment/Plan POD 9, s/p  ROBOTIC RECURRENT ANTERIOR DIAPHRAGMATIC HERNIA REPAIR WITH MESH ROBOTIC LYSIS OF ADHESIONS by Dr. Michaell Cowing 7/18 - D3 diet - possible remove JP drain today or this weekend - Mobilize, therapies - IS, pulm toilet - CT with interval repair of right-sided Morgagni hernia with no evidence of postop fluid collections or other complication within the abdomen or pelvis. There is a new moderate to large pericardial effusion. ECHO confirming pericardial effusion, consult  cardiology    FEN - D3 diet, IVF per primary VTE - Lovenox ID - None currently.    - Per primary team -  Fall 7/26 - denies head trauma or loc. Notified primary team.  Prader-Willi syndrome MR HLD OSA HTN HLD Hypothyroidism Lymphedema    LOS: 12 days   I reviewed last 24 h vitals and pain scores, last 48 h intake and output, last 24 h labs and trends, and last 24 h imaging results.  This care required high  level of medical decision making.   De Blanch [facility] Surgery 03/18/2022, 11:42 AM Please see Amion for pager number during day hours 7:00am-4:30pm or 7:00am -11:30am on weekends

## 2022-03-19 ENCOUNTER — Inpatient Hospital Stay (HOSPITAL_COMMUNITY): Payer: Medicare Other

## 2022-03-19 DIAGNOSIS — I3139 Other pericardial effusion (noninflammatory): Secondary | ICD-10-CM

## 2022-03-19 DIAGNOSIS — Q8711 Prader-Willi syndrome: Secondary | ICD-10-CM | POA: Diagnosis not present

## 2022-03-19 DIAGNOSIS — Q790 Congenital diaphragmatic hernia: Secondary | ICD-10-CM | POA: Diagnosis not present

## 2022-03-19 DIAGNOSIS — R000 Tachycardia, unspecified: Secondary | ICD-10-CM | POA: Diagnosis not present

## 2022-03-19 DIAGNOSIS — R0682 Tachypnea, not elsewhere classified: Secondary | ICD-10-CM | POA: Diagnosis not present

## 2022-03-19 LAB — CBC
HCT: 27.8 % — ABNORMAL LOW (ref 36.0–46.0)
Hemoglobin: 9.2 g/dL — ABNORMAL LOW (ref 12.0–15.0)
MCH: 31.3 pg (ref 26.0–34.0)
MCHC: 33.1 g/dL (ref 30.0–36.0)
MCV: 94.6 fL (ref 80.0–100.0)
Platelets: 582 10*3/uL — ABNORMAL HIGH (ref 150–400)
RBC: 2.94 MIL/uL — ABNORMAL LOW (ref 3.87–5.11)
RDW: 14.6 % (ref 11.5–15.5)
WBC: 18.5 10*3/uL — ABNORMAL HIGH (ref 4.0–10.5)
nRBC: 0.1 % (ref 0.0–0.2)

## 2022-03-19 LAB — BASIC METABOLIC PANEL
Anion gap: 6 (ref 5–15)
BUN: 17 mg/dL (ref 6–20)
CO2: 28 mmol/L (ref 22–32)
Calcium: 8.9 mg/dL (ref 8.9–10.3)
Chloride: 101 mmol/L (ref 98–111)
Creatinine, Ser: 0.77 mg/dL (ref 0.44–1.00)
GFR, Estimated: >60 mL/min (ref >60)
Glucose, Bld: 130 mg/dL — ABNORMAL HIGH (ref 70–99)
Potassium: 4.1 mmol/L (ref 3.5–5.1)
Sodium: 135 mmol/L (ref 135–145)

## 2022-03-19 LAB — ECHOCARDIOGRAM LIMITED
Height: 56 in
Weight: 2800 oz

## 2022-03-19 MED ORDER — COLCHICINE 0.6 MG PO TABS
0.6000 mg | ORAL_TABLET | Freq: Two times a day (BID) | ORAL | Status: DC
  Administered 2022-03-19 – 2022-03-24 (×9): 0.6 mg via ORAL
  Filled 2022-03-19 (×9): qty 1

## 2022-03-19 MED ORDER — IOHEXOL 350 MG/ML SOLN
51.0000 mL | Freq: Once | INTRAVENOUS | Status: AC | PRN
  Administered 2022-03-19: 51 mL via INTRAVENOUS

## 2022-03-19 MED ORDER — LACTATED RINGERS IV SOLN: INTRAVENOUS | Status: AC

## 2022-03-19 NOTE — Progress Notes (Signed)
11 Days Post-Op  Subjective: CC: Patient with continued generalized abdominal pain. Tolerating diet without n/v. BM yesterday. Some sob. No cp. No family at bedside.   Objective: Vital signs in last 24 hours: Temp:  [97.9 F (36.6 C)-98.8 F (37.1 C)] 98.3 F (36.8 C) (07/29 0303) Pulse Rate:  [98-101] 98 (07/29 0303) Resp:  [20-21] 20 (07/29 0303) BP: (90-122)/(51-79) 117/51 (07/29 0303) SpO2:  [92 %-99 %] 95 % (07/29 0303) Last BM Date : 03/18/22  Intake/Output from previous day: 07/28 0701 - 07/29 0700 In: 100.8 [I.V.:3.5; IV Piggyback:97.3] Out: 935 [Urine:850; Drains:85] Intake/Output this shift: No intake/output data recorded.  PE: Gen:  Alert, NAD, pleasant Pulm:  Rate and effort normal. CTA b/l. On 2L Abd: Soft, mild distension, generalized ttp that is stable. No rigidity or guarding, +BS, incisions with steri-strips intact appears well and are without drainage, bleeding, or signs of infection. JP SS, 85cc/24 hours - tubing stripped.   Lab Results:  Recent Labs    03/18/22 0328 03/19/22 0408  WBC 20.4* 18.5*  HGB 8.8* 9.2*  HCT 26.8* 27.8*  PLT 547* 582*   BMET Recent Labs    03/18/22 0328 03/19/22 0408  NA 132* 135  K 4.4 4.1  CL 98 101  CO2 28 28  GLUCOSE 110* 130*  BUN 20 17  CREATININE 0.58 0.77  CALCIUM 8.6* 8.9   PT/INR No results for input(s): "LABPROT", "INR" in the last 72 hours. CMP     Component Value Date/Time   NA 135 03/19/2022 0408   K 4.1 03/19/2022 0408   CL 101 03/19/2022 0408   CO2 28 03/19/2022 0408   GLUCOSE 130 (H) 03/19/2022 0408   BUN 17 03/19/2022 0408   CREATININE 0.77 03/19/2022 0408   CALCIUM 8.9 03/19/2022 0408   PROT 4.9 (L) 03/09/2022 2307   ALBUMIN 2.4 (L) 03/09/2022 2307   AST 31 03/09/2022 2307   ALT 35 03/09/2022 2307   ALKPHOS 60 03/09/2022 2307   BILITOT 0.9 03/09/2022 2307   GFRNONAA >60 03/19/2022 0408   GFRAA >60 03/04/2018 0427   Lipase     Component Value Date/Time   LIPASE 24  03/16/2022 1108    Studies/Results: ECHOCARDIOGRAM LIMITED  Result Date: 03/17/2022    ECHOCARDIOGRAM LIMITED REPORT   Patient Name:   Barbara Ward Date of Exam: 03/17/2022 Medical Rec #:  [id number]     Height:       56.0 in Accession #:    [id number]    Weight:       175.0 lb Date of Birth:  Oct 01, 1970     BSA:          1.677 m Patient Age:    51 years      BP:           101/38 mmHg Patient Gender: F             HR:           96 bpm. Exam Location:  Inpatient Procedure: Limited Echo Indications:    Pericardial effusion  History:        Patient has prior history of Echocardiogram examinations, most                 recent 03/10/2022. Risk Factors:Hypertension.  Sonographer:    Roosvelt Maser RDCS Referring Phys: Flossie Buffy AMPONSAH IMPRESSIONS  1. Left ventricular ejection fraction, by estimation, is 55 to 60%. The left ventricle has normal function. The left ventricle has  no regional wall motion abnormalities.  2. Right ventricular systolic function is mildly reduced. The right ventricular size is normal. Tricuspid regurgitation signal is inadequate for assessing PA pressure.  3. The inferior vena cava is normal in size with <50% respiratory variability, suggesting right atrial pressure of 8 mmHg.  4. < 25% respirophasic variation of mitral valve E inflow velocity. No RV diastolic collapse noted. The IVC is not significantly dilated. No evidence for tamponade though the pericardial effusion is larger than on the prior study. Moderate pericardial effusion. The pericardial effusion is circumferential. FINDINGS  Left Ventricle: Left ventricular ejection fraction, by estimation, is 55 to 60%. The left ventricle has normal function. The left ventricle has no regional wall motion abnormalities. Right Ventricle: The right ventricular size is normal. Right ventricular systolic function is mildly reduced. Tricuspid regurgitation signal is inadequate for assessing PA pressure. Pericardium: < 25% respirophasic variation of  mitral valve E inflow velocity. No RV diastolic collapse noted. The IVC is not significantly dilated. No evidence for tamponade though the pericardial effusion is larger than on the prior study. A moderately sized pericardial effusion is present. The pericardial effusion is circumferential. Venous: The inferior vena cava is normal in size with less than 50% respiratory variability, suggesting right atrial pressure of 8 mmHg.  Diastology LV e' medial:  7.40 cm/s LV e' lateral: 9.03 cm/s  IVC IVC diam: 1.80 cm Dalton McleanMD Electronically signed by Wilfred Lacy Signature Date/Time: 03/17/2022/3:44:03 PM    Final     Anti-infectives: Anti-infectives (From admission, onward)    Start     Dose/Rate Route Frequency Ordered Stop   03/18/22 1645  cefTRIAXone (ROCEPHIN) 2 g in sodium chloride 0.9 % 100 mL IVPB        2 g 200 mL/hr over 30 Minutes Intravenous Every 24 hours 03/18/22 1548 03/23/22 1644   03/18/22 1215  sulfamethoxazole-trimethoprim (BACTRIM DS) 800-160 MG per tablet 1 tablet  Status:  Discontinued        1 tablet Oral Every 12 hours 03/18/22 1123 03/18/22 1548   02/19/2022 1230  vancomycin (VANCOREADY) IVPB 1500 mg/300 mL        1,500 mg 150 mL/hr over 120 Minutes Intravenous On call to O.R. 03/07/22 2150 03/03/2022 1425   02/23/2022 0600  gentamicin (GARAMYCIN) 300 mg in dextrose 5 % 100 mL IVPB       See Hyperspace for full Linked Orders Report.   300 mg 107.5 mL/hr over 60 Minutes Intravenous On call to O.R. 03/07/22 1321 03/12/2022 1430   03/20/2022 0600  Vancomycin (VANCOCIN) 1,500 mg in sodium chloride 0.9 % 500 mL IVPB  Status:  Discontinued        1,500 mg 250 mL/hr over 120 Minutes Intravenous  Once 03/07/22 1424 03/07/22 2154   03/07/22 1415  Vancomycin (VANCOCIN) 1,500 mg in sodium chloride 0.9 % 500 mL IVPB  Status:  Discontinued       See Hyperspace for full Linked Orders Report.   1,500 mg 250 mL/hr over 120 Minutes Intravenous  Once 03/07/22 1321 03/07/22 1425         Assessment/Plan POD 11, s/p  ROBOTIC RECURRENT ANTERIOR DIAPHRAGMATIC HERNIA REPAIR WITH MESH ROBOTIC LYSIS OF ADHESIONS by Dr. Michaell Cowing 7/18 - D3 diet - Discussed case with TCTS. They recommended leaving surgical drain for now.  - Mobilize, therapies - IS, pulm toilet - CT with interval repair of right-sided Morgagni hernia with no evidence of postop fluid collections or other complication within the abdomen or pelvis.  There is a new moderate to large pericardial effusion. ECHO confirming pericardial effusion. Cards has seen and plans for repeat echo early next week.     FEN - D3 diet, IVF per primary VTE - Lovenox ID - Rocephin for possible UTI. UCx pending.     - Per primary team -  Fall 7/26 - denies head trauma or loc. Notified primary team.  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

## 2022-03-19 NOTE — Hospital Course (Signed)
Small bowel obstruction secondary to incarcerated chronic anterior diaphragmatic hernia Patient's presenting complaint was abdominal pain, nausea.  Began suddenly night prior to admission.  Exacerbated with meal.  CT abdomen pelvis showed large anterior diaphragmatic hernia with small bowel obstruction above and below diaphragm.  Patient was medically treated with NG tube decompression.  Her symptoms improved within a day and her diet was advanced.  She elected to undergo surgery for repair of her hernia.  Although her postoperative course was complicated, her bowel function returned to normal and she was tolerating a diet for several days prior to discharge.  Postoperative respiratory failure and shock Patient intraoperative hypotension and concerning end-tidal CO2 changes requiring postoperative ICU care with intubation and pressor support.  She stabilized and was transferred to the floor after 4 days in the ICU.  Acute complicated urinary tract infection Postoperatively, patient had slowly uptrending leukocytosis.  She remained afebrile.  Work-up for postoperative abdominal pain in setting of leukocytosis was unremarkable for bowel injury or infection.  Initially, there were no other localizing signs of infection.  However she noted increased urinary frequency and complained of rigors.  UA did show pyuria and bacteriuria.  IV ceftriaxone was started for UTI with systemic signs including rigors, tachycardia and tachypnea.  Pericardial effusion Patient was incidentally found to have a pericardial effusion during work-up for postoperative abdominal pain in the setting of leukocytosis.  TTE showed moderately sized, circumferential pericardial effusion.  This was thought to be secondary to cardiac and pericardial manipulation during this patient's hernia surgery.  Serial TTE showed ***  Tachypnea Tachycardia Likely multifactorial in patient with complicated UTI, poor mobility after recent surgery,  atelectasis, deconditioning.  CTA ***

## 2022-03-19 NOTE — Progress Notes (Addendum)
Subjective:   Hospital day 13.  Interval events: None.  Continues to complain of diffuse abdominal soreness.  She felt flushed and sweaty this morning.  She also has epigastric pain that has changed in quality.  She was unable to characterize it but it feels different than her abdominal pain.  She endorses new chest pain with breathing and coughing.  She has some mild shortness of breath.  She denies sputum production.  She denies pain around site of JP drain and left IJ central line.  She denies dysuria but does endorse a change in the color of her urine to a dark amber.  She continues to move bowels.  She continues to have a good appetite.  She was interviewed in the presence of a caretaker from her group home.  Objective:  Vital signs: Blood pressure (!) 117/51, pulse 98, temperature 98.3 F (36.8 C), temperature source Oral, resp. rate 20, height 4\' 8"  (1.422 m), weight 79.4 kg, SpO2 95 %.  Supplemental O2: 2L [place]  Physical exam: Constitutional: Female laying semireclined in bed.  In no apparent acute distress. Cardiovascular: Tachycardia. Pulmonary: Some difficulty speaking between breaths.  Lung sounds decreased right lower lobe. Abdominal: Diffusely tender to palpation.  Nondistended. Skin: Skin around drain, line, incisions is clean and nonerythematous. Extremities: Right radial pulse 2+. Neuro: Alert and oriented. Psych: Pleasant.  Appropriate mood and affect.   Intake/Output Summary (Last 24 hours) at 03/19/2022 0756 Last data filed at 03/19/2022 03/17/2022 Gross per 24 hour  Intake 100.77 ml  Output 935 ml  Net -834.23 ml    Pertinent Labs:    Latest Ref Rng & Units 03/19/2022    4:08 AM 03/18/2022    3:28 AM 03/17/2022    9:00 AM  CBC  WBC 4.0 - 10.5 K/uL 18.5  20.4  21.7   Hemoglobin 12.0 - 15.0 g/dL 9.2  8.8  9.9   Hematocrit 36.0 - 46.0 % 27.8  26.8  30.4   Platelets 150 - 400 K/uL 582  547  546        Latest Ref Rng & Units 03/19/2022     4:08 AM 03/18/2022    3:28 AM 03/17/2022    4:19 AM  CMP  Glucose 70 - 99 mg/dL 03/19/2022  027  253   BUN 6 - 20 mg/dL 17  20  15    Creatinine 0.44 - 1.00 mg/dL 664   4.03   Sodium 135 - 145 mmol/L 135  132  132   Potassium 3.5 - 5.1 mmol/L 4.1  4.4  3.3   Chloride 98 - 111 mmol/L 101  98  97   CO2 22 - 32 mmol/L 28  28  28    Calcium 8.9 - 10.3 mg/dL 8.9  8.6  8.7    Urine culture positive for gram-negative rods Blood cultures no growth to date 2 days  Imaging: CXR: Low lung volumes.  No concerning opacities.  No interval changes since 03/11/2022.  CTA chest with contrast pending.  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 with a PMH of Prader-Willi  syndrome, small bowel obstruction secondary to anterior diaphragmatic hernia, who is status post operative repair of hernia, now with increased urinary frequency, bacteriuria, and pyuria with systemic symptoms, due to UTI.  Also with persistent tachycardia and tachypnea due to deconditioning versus infection versus PE.  Tachycardia Tachypnea Likely multifactorial due to atelectasis, low lung volumes, general deconditioning after surgery, presumed complicated UTI.  We will look for improvement in days to come with treatment of UTI and increased mobility.  However, my index of suspicion for PE is high in this patient with tachypnea, tachycardia who has been largely immobile for over a week after surgery. - Follow-up CTA chest with contrast. - Encourage mobility and incentive spirometry.   Acute complicated urinary tract infection Symptomatic with increased urinary frequency.  Endorses rigors and  hot flashes.  Urine culture shows gram-negative rods, pending full report.  Downtrending leukocytosis today after 1 dose of ceftriaxone. - Continue ceftriaxone 2 g IV every 24 hours  Circumferential pericardial effusion Hemodynamically stable.  No indication for pericardiocentesis.  Continue to monitor.  Abdominal pain, stable Small bowel obstruction, resolved Incarcerated anterior diaphragmatic hernia, status post laparoscopic repair on 02/21/2022 Stable.  Pain is diffuse.  Abdominal exam is reassuring.  CT A/P is also reassuring. - Continue acetaminophen 1000 mg p.o. every 6 hours  Hyperlipidemia - Continue atorvastatin 40 mg daily  Hypothyroidism TSH 0.474 on admission - Continue Synthroid 137 mcg daily  Vitamin D deficiency - Continue cholecalciferol  Anxiety - Continue home alprazolam, BuSpar, citalopram   Diet: Normal IVF: None,None VTE: Enoxaparin Code: Full PT/OT recs: wheelchair and hospital bed. Family Update: mother at bedside   Dispo: Anticipated discharge to  Group home pending evaluation and management of acute medical conditions.  Marrianne Mood MD 03/19/2022, 7:56 AM  Pager: [phone] After 5pm on weekdays and 1pm on weekends: On Call pager: [phone]

## 2022-03-20 DIAGNOSIS — Q8711 Prader-Willi syndrome: Secondary | ICD-10-CM | POA: Diagnosis not present

## 2022-03-20 DIAGNOSIS — N3000 Acute cystitis without hematuria: Secondary | ICD-10-CM

## 2022-03-20 DIAGNOSIS — I3139 Other pericardial effusion (noninflammatory): Secondary | ICD-10-CM

## 2022-03-20 DIAGNOSIS — N390 Urinary tract infection, site not specified: Secondary | ICD-10-CM

## 2022-03-20 DIAGNOSIS — R000 Tachycardia, unspecified: Secondary | ICD-10-CM | POA: Diagnosis not present

## 2022-03-20 DIAGNOSIS — R0682 Tachypnea, not elsewhere classified: Secondary | ICD-10-CM | POA: Diagnosis not present

## 2022-03-20 LAB — URINE CULTURE: Culture: >=100000 — AB

## 2022-03-20 LAB — BASIC METABOLIC PANEL
Anion gap: 5 (ref 5–15)
BUN: 15 mg/dL (ref 6–20)
CO2: 29 mmol/L (ref 22–32)
Calcium: 8.8 mg/dL — ABNORMAL LOW (ref 8.9–10.3)
Chloride: 103 mmol/L (ref 98–111)
Creatinine, Ser: 0.46 mg/dL (ref 0.44–1.00)
GFR, Estimated: >60 mL/min (ref >60)
Glucose, Bld: 110 mg/dL — ABNORMAL HIGH (ref 70–99)
Potassium: 4 mmol/L (ref 3.5–5.1)
Sodium: 137 mmol/L (ref 135–145)

## 2022-03-20 LAB — CBC
HCT: 28.1 % — ABNORMAL LOW (ref 36.0–46.0)
Hemoglobin: 9.1 g/dL — ABNORMAL LOW (ref 12.0–15.0)
MCH: 31.2 pg (ref 26.0–34.0)
MCHC: 32.4 g/dL (ref 30.0–36.0)
MCV: 96.2 fL (ref 80.0–100.0)
Platelets: 613 10*3/uL — ABNORMAL HIGH (ref 150–400)
RBC: 2.92 MIL/uL — ABNORMAL LOW (ref 3.87–5.11)
RDW: 14.7 % (ref 11.5–15.5)
WBC: 19.5 10*3/uL — ABNORMAL HIGH (ref 4.0–10.5)
nRBC: 0 % (ref 0.0–0.2)

## 2022-03-20 MED ORDER — GUAIFENESIN 100 MG/5ML PO LIQD
5.0000 mL | Freq: Four times a day (QID) | ORAL | Status: DC | PRN
Start: 2022-03-20 — End: 2022-03-25

## 2022-03-20 NOTE — Progress Notes (Addendum)
12 Days Post-Op  Subjective: CC: Continued generalized abdominal pain. Tolerating diet without n/v. Having bowel function. Drain output down. Denies CP.   Objective: Vital signs in last 24 hours: Temp:  [97.3 F (36.3 C)-98.2 F (36.8 C)] 98.2 F (36.8 C) (07/30 0500) Pulse Rate:  [95-103] 95 (07/30 0500) Resp:  [18-22] 20 (07/30 0500) BP: (112-126)/(64-78) 118/64 (07/30 0500) SpO2:  [92 %-95 %] 94 % (07/30 0500) Last BM Date : 03/19/22  Intake/Output from previous day: 07/29 0701 - 07/30 0700 In: -  Out: 10 [Drains:10] Intake/Output this shift: No intake/output data recorded.  PE: Gen:  Alert, NAD, pleasant Pulm:  Rate and effort normal. CTA b/l. On 2L Abd: Soft, mild distension, generalized ttp that is stable. No rigidity or guarding, +BS, incisions with steri-strips intact appears well and are without drainage, bleeding, or signs of infection. JP SS, 10cc/24 hours  Lab Results:  Recent Labs    03/19/22 0408 03/20/22 0410  WBC 18.5* 19.5*  HGB 9.2* 9.1*  HCT 27.8* 28.1*  PLT 582* 613*   BMET Recent Labs    03/19/22 0408 03/20/22 0410  NA 135 137  K 4.1 4.0  CL 101 103  CO2 28 29  GLUCOSE 130* 110*  BUN 17 15  CREATININE 0.77 0.46  CALCIUM 8.9 8.8*   PT/INR No results for input(s): "LABPROT", "INR" in the last 72 hours. CMP     Component Value Date/Time   NA 137 03/20/2022 0410   K 4.0 03/20/2022 0410   CL 103 03/20/2022 0410   CO2 29 03/20/2022 0410   GLUCOSE 110 (H) 03/20/2022 0410   BUN 15 03/20/2022 0410   CREATININE 0.46 03/20/2022 0410   CALCIUM 8.8 (L) 03/20/2022 0410   PROT 4.9 (L) 03/09/2022 2307   ALBUMIN 2.4 (L) 03/09/2022 2307   AST 31 03/09/2022 2307   ALT 35 03/09/2022 2307   ALKPHOS 60 03/09/2022 2307   BILITOT 0.9 03/09/2022 2307   GFRNONAA >60 03/20/2022 0410   GFRAA >60 03/04/2018 0427   Lipase     Component Value Date/Time   LIPASE 24 03/13/2022 1108    Studies/Results: ECHOCARDIOGRAM LIMITED  Result Date:  03/19/2022    ECHOCARDIOGRAM LIMITED REPORT   Patient Name:   JAHMIA BERRETT Bia Date of Exam: 03/19/2022 Medical Rec #:  [id number]     Height:       56.0 in Accession #:    [id number]    Weight:       175.0 lb Date of Birth:  [birth year]     BSA:          1.677 m Patient Age:    51 years      BP:           119/73 mmHg Patient Gender: F             HR:           79 bpm. Exam Location:  Inpatient Procedure: Limited Echo Indications:    Pericardial effusion  History:        Patient has prior history of Echocardiogram examinations.                 Pericardial Disease.  Sonographer:    DBB Referring Phys: [id number] Marquita Palms VINCENT IMPRESSIONS  1. Left ventricular ejection fraction, by estimation, is 65 to 70%. The left ventricle has normal function. The left ventricle has no regional wall motion abnormalities.  2. Right ventricular systolic function is normal. The  right ventricular size is normal.  3. There is a moderate to large circumferential pericardial effusion which measures 2.28 cm at greatest diameter posteriorly. There is<25% rertrophasic variation in the Mitral valve E inflow velocity. No diastolic RV collapse is present. The IVC is not visualized. There is no definitive evidence of cardiac tamponade although the effusion has increased in size compared to echo 03/17/22. FINDINGS  Left Ventricle: Left ventricular ejection fraction, by estimation, is 65 to 70%. The left ventricle has normal function. The left ventricle has no regional wall motion abnormalities. The left ventricular internal cavity size was normal in size. There is  no left ventricular hypertrophy. Right Ventricle: The right ventricular size is normal. No increase in right ventricular wall thickness. Right ventricular systolic function is normal. Left Atrium: Left atrial size was normal in size. Right Atrium: Right atrial size was normal in size. Pericardium: There pericardial effusion measures 2.28cm at greatest diameter posteriorly. There is a  change in mitral valve inflow velocity with respirations. The IVC is not visualized. A moderately sized pericardial effusion is present. The pericardial effusion is circumferential. There is diastolic collapse of the right atrial wall. There is no evidence of cardiac tamponade. Mitral Valve: The mitral valve was not assessed. Tricuspid Valve: The tricuspid valve is normal in structure. Tricuspid valve regurgitation is not demonstrated. No evidence of tricuspid stenosis. Aortic Valve: The aortic valve was not assessed. Pulmonic Valve: The pulmonic valve was normal in structure. Pulmonic valve regurgitation is not visualized. No evidence of pulmonic stenosis. Aorta: The aortic root is normal in size and structure. Venous: The inferior vena cava was not well visualized. IAS/Shunts: No atrial level shunt detected by color flow Doppler. Armanda Magic MD Electronically signed by Armanda Magic MD Signature Date/Time: 03/19/2022/7:50:14 PM    Final    CT Angio Chest Pulmonary Embolism (PE) W or WO Contrast  Result Date: 03/19/2022 CLINICAL DATA:  Elevated D-dimer. High probability for pulmonary embolism. Postop from diaphragmatic hernia repair. EXAM: CT ANGIOGRAPHY CHEST WITH CONTRAST TECHNIQUE: Multidetector CT imaging of the chest was performed using the standard protocol during bolus administration of intravenous contrast. Multiplanar CT image reconstructions and MIPs were obtained to evaluate the vascular anatomy. RADIATION DOSE REDUCTION: This exam was performed according to the departmental dose-optimization program which includes automated exposure control, adjustment of the mA and/or kV according to patient size and/or use of iterative reconstruction technique. CONTRAST:  52mL OMNIPAQUE IOHEXOL 350 MG/ML SOLN COMPARISON:  AP CT on 03/16/2022 FINDINGS: Cardiovascular: Satisfactory opacification of pulmonary arteries noted, and no pulmonary emboli identified. No evidence of thoracic aortic dissection or  aneurysm. Large pericardial effusion shows slight increase in size since previous study. Mediastinum/Nodes: No masses or pathologically enlarged lymph nodes identified. Lungs/Pleura: Small bilateral pleural effusions. Bilateral subsegmental atelectasis, greatest in the posterior right lower lobe. Upper abdomen: No acute findings. No residual diaphragmatic or hiatal hernia. Surgical drain remains place. Musculoskeletal: No suspicious bone lesions identified. Review of the MIP images confirms the above findings. IMPRESSION: No evidence of pulmonary embolism. Large pericardial effusion, slightly increase in size since previous study. Small bilateral pleural effusions and bilateral atelectasis, right side greater than left. These results were called by telephone at the time of interpretation on 03/19/2022 at 5:25 pm to provider Dr. Benito Mccreedy, who verbally acknowledged these results. Electronically Signed   By: Danae Orleans M.D.   On: 03/19/2022 17:23   DG CHEST PORT 1 VIEW  Result Date: 03/19/2022 CLINICAL DATA:  Diaphragmatic hernia repair. EXAM: PORTABLE CHEST  1 VIEW COMPARISON:  03/11/2022 FINDINGS: LEFT IJ central venous catheter with tip overlying the SUPERIOR cavoatrial junction and surgical tube/drain overlying the RIGHT chest/mediastinum again noted. An NG tube has been removed. Low lung volumes and scattered areas of atelectasis again noted. There is no evidence of pneumothorax. Probable trace effusions are again noted. IMPRESSION: NG tube removal without other significant change. No pneumothorax. Electronically Signed   By: Harmon Pier M.D.   On: 03/19/2022 12:50    Anti-infectives: Anti-infectives (From admission, onward)    Start     Dose/Rate Route Frequency Ordered Stop   03/18/22 1645  cefTRIAXone (ROCEPHIN) 2 g in sodium chloride 0.9 % 100 mL IVPB        2 g 200 mL/hr over 30 Minutes Intravenous Every 24 hours 03/18/22 1548 03/23/22 1644   03/18/22 1215  sulfamethoxazole-trimethoprim (BACTRIM  DS) 800-160 MG per tablet 1 tablet  Status:  Discontinued        1 tablet Oral Every 12 hours 03/18/22 1123 03/18/22 1548   02/21/2022 1230  vancomycin (VANCOREADY) IVPB 1500 mg/300 mL        1,500 mg 150 mL/hr over 120 Minutes Intravenous On call to O.R. 03/07/22 2150 03/12/2022 1425   03/07/2022 0600  gentamicin (GARAMYCIN) 300 mg in dextrose 5 % 100 mL IVPB       See Hyperspace for full Linked Orders Report.   300 mg 107.5 mL/hr over 60 Minutes Intravenous On call to O.R. 03/07/22 1321 03/06/2022 1430   02/26/2022 0600  Vancomycin (VANCOCIN) 1,500 mg in sodium chloride 0.9 % 500 mL IVPB  Status:  Discontinued        1,500 mg 250 mL/hr over 120 Minutes Intravenous  Once 03/07/22 1424 03/07/22 2154   03/07/22 1415  Vancomycin (VANCOCIN) 1,500 mg in sodium chloride 0.9 % 500 mL IVPB  Status:  Discontinued       See Hyperspace for full Linked Orders Report.   1,500 mg 250 mL/hr over 120 Minutes Intravenous  Once 03/07/22 1321 03/07/22 1425        Assessment/Plan POD 12, s/p  ROBOTIC RECURRENT ANTERIOR DIAPHRAGMATIC HERNIA REPAIR WITH MESH ROBOTIC LYSIS OF ADHESIONS by Dr. Michaell Cowing 7/18 - D3 diet - Discussed case with TCTS. They recommended leaving surgical drain for now. Output downtrending.  - Mobilize, therapies - IS, pulm toilet - CT with interval repair of right-sided Morgagni hernia with no evidence of postop fluid collections or other complication within the abdomen or pelvis. There is a new moderate to large pericardial effusion. ECHO confirming pericardial effusion. CTA yesterday w/ slightly larger pericardial effusion. Primary discussed with cards who got an echo that showed no evidence of cardiac tamponade. Report they are not planning for any procedure at this time and will continue to follow.     FEN - D3 diet, IVF per primary VTE - Lovenox ID - Rocephin for E. Coli UTI   - Per primary team -  Fall 7/26 - denies head trauma or loc. Notified primary team.  Prader-Willi  syndrome MR HLD OSA HTN HLD Hypothyroidism Lymphedema      LOS: 14 days    Jacinto Halim , [facility] 03/20/2022, 9:45 AM Please see Amion for pager number during day hours 7:00am-4:30pm

## 2022-03-20 NOTE — Plan of Care (Signed)
  Problem: Education: Goal: Knowledge of General Education information will improve Description: Including pain rating scale, medication(s)/side effects and non-pharmacologic comfort measures Outcome: Progressing   Problem: Activity: Goal: Risk for activity intolerance will decrease Outcome: Progressing   Problem: Nutrition: Goal: Adequate nutrition will be maintained Outcome: Progressing   

## 2022-03-20 NOTE — Progress Notes (Addendum)
Subjective:   Hospital day 14.  Interval events: None.  Complains of a new cough.  Still complains of abdominal soreness.  Continues to have some shortness of breath.  Continues to have some sweats and chills.  These have all been ongoing for several days now.  She is strongly encouraged to get out of bed and move.  Objective:  Vital signs: Blood pressure 118/64, pulse 95, temperature 98.2 F (36.8 C), temperature source Oral, resp. rate 20, height 4\' 8"  (1.422 m), weight 79.4 kg, SpO2 94 %.  Supplemental O2: 2 L via [place]  Physical exam: Constitutional: Female laying semireclined in bed.  No apparent acute distress. Cardiovascular: Regular rate and rhythm. Pulmonary: Some difficulty speaking between breaths.  Lung sounds decreased right lower lobe.  No changes from yesterday. Abdominal: Diffusely tender to palpation.  Nondistended. Skin: Skin around drain, line, incisions is clean and nonerythematous. Extremities: Right radial pulse 2+. Neuro: Alert and oriented. Psych: Pleasant.  Appropriate mood and affect.  No intake or output data in the 24 hours ending 03/20/22 1123   Pertinent Labs:    Latest Ref Rng & Units 03/20/2022    4:10 AM 03/19/2022    4:08 AM 03/18/2022    3:28 AM  CBC  WBC 4.0 - 10.5 K/uL 19.5  18.5  20.4   Hemoglobin 12.0 - 15.0 g/dL 9.1  9.2  8.8   Hematocrit 36.0 - 46.0 % 28.1  27.8  26.8   Platelets 150 - 400 K/uL 613  582  547        Latest Ref Rng & Units 03/20/2022    4:10 AM 03/19/2022    4:08 AM 03/18/2022    3:28 AM  CMP  Glucose 70 - 99 mg/dL 03/20/2022  782  423   BUN 6 - 20 mg/dL 15  17  20    Creatinine 0.44 - 1.00 mg/dL 536   1.44   Sodium 135 - 145 mmol/L 137  135  132   Potassium 3.5 - 5.1 mmol/L 4.0  4.1  4.4   Chloride 98 - 111 mmol/L 103  101  98   CO2 22 - 32 mmol/L 29  28  28    Calcium 8.9 - 10.3 mg/dL 8.8  8.9  8.6    Echo 3.15: Circumferential pericardial effusion.  Some interval  enlargement.  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 with a PMH of Prader-Willi syndrome, small bowel obstruction secondary to anterior diaphragmatic hernia, who is status post-operative repair of hernia, with hospital course complicated by UTI and pericardial effusion.  Circumferential moderate to large pericardial effusion Still no signs of tamponade.  No indication for pericardiocentesis.  Monitor with serial TTE.  Appreciate input from cardiology. - Start colchicine 0.6 mg p.o. twice daily - Follow-up afternoon TTE.  Tachycardia Tachypnea Likely multifactorial due to atelectasis, low lung volumes, general deconditioning after surgery, presumed complicated UTI, enlarging pericardial effusion.  We will look for improvement in days to come with treatment of UTI and increased mobility.  We will  continue monitoring for signs of tamponade.  CTA PE protocol was negative for PE. - Encourage mobility and incentive spirometry.  Acute complicated urinary tract infection Pansensitive E. coli.  Will complete 3-day course of IV antibiotics at end of day today. - Discontinue ceftriaxone after third dose today.  Abdominal pain, stable Small bowel obstruction, resolved Incarcerated anterior diaphragmatic hernia, status post laparoscopic repair on 03/14/2022 Stable.  Pain is diffuse.  Patient is moving her bowels.  Abdominal exam is reassuring.  CT A/P is also reassuring. - Continue acetaminophen 1000 mg p.o. every 6 hours   Hyperlipidemia - Continue atorvastatin 40 mg daily  Hypothyroidism TSH  0.474 on admission - Continue Synthroid 137 mcg daily  Vitamin D deficiency - Continue cholecalciferol  Anxiety - Continue home alprazolam, BuSpar, citalopram   Diet: Normal IVF: None,None VTE: Enoxaparin Code: Full PT/OT recs: wheelchair and hospital bed. Family Update: mother at bedside   Dispo: Anticipated discharge to  Group home pending evaluation and management of acute medical conditions.   Marrianne Mood MD 03/20/2022, 11:23 AM  Pager: [phone] After 5pm on weekdays and 1pm on weekends: On Call pager: [phone]

## 2022-03-20 NOTE — Progress Notes (Signed)
Group home care giver April, RN and RN attempted to clean pt  of stool and urine. Pt refused toileting. RN educated pt of importance of cleansing and drying skin, especially after toileting. Pt also refused all medications at this time. Benito Mccreedy, MD and pt mother [name] notified.

## 2022-03-21 ENCOUNTER — Inpatient Hospital Stay (HOSPITAL_COMMUNITY): Payer: Medicare Other

## 2022-03-21 ENCOUNTER — Encounter (HOSPITAL_COMMUNITY)
Admission: EM | Disposition: E | Payer: Self-pay | Source: Home / Self Care | Attending: Student in an Organized Health Care Education/Training Program

## 2022-03-21 DIAGNOSIS — I3139 Other pericardial effusion (noninflammatory): Secondary | ICD-10-CM

## 2022-03-21 DIAGNOSIS — R1084 Generalized abdominal pain: Secondary | ICD-10-CM | POA: Diagnosis not present

## 2022-03-21 DIAGNOSIS — R0682 Tachypnea, not elsewhere classified: Secondary | ICD-10-CM | POA: Diagnosis not present

## 2022-03-21 DIAGNOSIS — N390 Urinary tract infection, site not specified: Secondary | ICD-10-CM

## 2022-03-21 DIAGNOSIS — R000 Tachycardia, unspecified: Secondary | ICD-10-CM | POA: Diagnosis not present

## 2022-03-21 LAB — BASIC METABOLIC PANEL
Anion gap: 7 (ref 5–15)
BUN: 10 mg/dL (ref 6–20)
CO2: 29 mmol/L (ref 22–32)
Calcium: 9 mg/dL (ref 8.9–10.3)
Chloride: 103 mmol/L (ref 98–111)
Creatinine, Ser: 0.45 mg/dL (ref 0.44–1.00)
GFR, Estimated: >60 mL/min (ref >60)
Glucose, Bld: 122 mg/dL — ABNORMAL HIGH (ref 70–99)
Potassium: 3.8 mmol/L (ref 3.5–5.1)
Sodium: 139 mmol/L (ref 135–145)

## 2022-03-21 LAB — CBC
HCT: 30.2 % — ABNORMAL LOW (ref 36.0–46.0)
Hemoglobin: 9.4 g/dL — ABNORMAL LOW (ref 12.0–15.0)
MCH: 30.8 pg (ref 26.0–34.0)
MCHC: 31.1 g/dL (ref 30.0–36.0)
MCV: 99 fL (ref 80.0–100.0)
Platelets: 664 10*3/uL — ABNORMAL HIGH (ref 150–400)
RBC: 3.05 MIL/uL — ABNORMAL LOW (ref 3.87–5.11)
RDW: 15.3 % (ref 11.5–15.5)
WBC: 16.6 10*3/uL — ABNORMAL HIGH (ref 4.0–10.5)
nRBC: 0 % (ref 0.0–0.2)

## 2022-03-21 LAB — ECHOCARDIOGRAM LIMITED
Height: 56 in
Weight: 2800 oz

## 2022-03-21 SURGERY — PERICARDIOCENTESIS
Anesthesia: LOCAL

## 2022-03-21 NOTE — Progress Notes (Signed)
Patient ID: Barbara Ward, female   DOB: [birth year], 51 y.o.   MRN: [id number] 13 Days Post-Op    Subjective: Drinking coffee, throat still a bit raspy ROS negative except as listed above. Objective: Vital signs in last 24 hours: Temp:  [97.9 F (36.6 C)-98.3 F (36.8 C)] 97.9 F (36.6 C) (07/31 0831) Pulse Rate:  [90-109] 109 (07/31 0831) Resp:  [18-23] 20 (07/31 0831) BP: (116-127)/(68-74) 123/69 (07/31 0831) SpO2:  [91 %-95 %] 93 % (07/31 0831) Last BM Date : April 16, 2022  Intake/Output from previous day: No intake/output data recorded. Intake/Output this shift: Total I/O In: 240 [P.O.:240] Out: -   General appearance: alert and cooperative Resp: clear to auscultation bilaterally GI: incisions CDI, drain SS  Lab Results: CBC  Recent Labs    03/20/22 0410 Apr 16, 2022 0550  WBC 19.5* 16.6*  HGB 9.1* 9.4*  HCT 28.1* 30.2*  PLT 613* 664*   BMET Recent Labs    03/20/22 0410 Apr 16, 2022 0550  NA 137 139  K 4.0 3.8  CL 103 103  CO2 29 29  GLUCOSE 110* 122*  BUN 15 10  CREATININE 0.46 0.45  CALCIUM 8.8* 9.0   PT/INR No results for input(s): "LABPROT", "INR" in the last 72 hours. ABG No results for input(s): "PHART", "HCO3" in the last 72 hours.  Invalid input(s): "PCO2", "PO2"  Studies/Results: ECHOCARDIOGRAM LIMITED  Result Date: 03/19/2022    ECHOCARDIOGRAM LIMITED REPORT   Patient Name:   Barbara Ward Date of Exam: 03/19/2022 Medical Rec #:  [id number]     Height:       56.0 in Accession #:    [id number]    Weight:       175.0 lb Date of Birth:  April 26, 1971     BSA:          1.677 m Patient Age:    51 years      BP:           119/73 mmHg Patient Gender: F             HR:           79 bpm. Exam Location:  Inpatient Procedure: Limited Echo Indications:    Pericardial effusion  History:        Patient has prior history of Echocardiogram examinations.                 Pericardial Disease.  Sonographer:    DBB Referring Phys: [id number] Marquita Palms VINCENT IMPRESSIONS  1.  Left ventricular ejection fraction, by estimation, is 65 to 70%. The left ventricle has normal function. The left ventricle has no regional wall motion abnormalities.  2. Right ventricular systolic function is normal. The right ventricular size is normal.  3. There is a moderate to large circumferential pericardial effusion which measures 2.28 cm at greatest diameter posteriorly. There is<25% rertrophasic variation in the Mitral valve E inflow velocity. No diastolic RV collapse is present. The IVC is not visualized. There is no definitive evidence of cardiac tamponade although the effusion has increased in size compared to echo 03/17/22. FINDINGS  Left Ventricle: Left ventricular ejection fraction, by estimation, is 65 to 70%. The left ventricle has normal function. The left ventricle has no regional wall motion abnormalities. The left ventricular internal cavity size was normal in size. There is  no left ventricular hypertrophy. Right Ventricle: The right ventricular size is normal. No increase in right ventricular wall thickness. Right ventricular systolic function is normal. Left Atrium: Left  atrial size was normal in size. Right Atrium: Right atrial size was normal in size. Pericardium: There pericardial effusion measures 2.28cm at greatest diameter posteriorly. There is a change in mitral valve inflow velocity with respirations. The IVC is not visualized. A moderately sized pericardial effusion is present. The pericardial effusion is circumferential. There is diastolic collapse of the right atrial wall. There is no evidence of cardiac tamponade. Mitral Valve: The mitral valve was not assessed. Tricuspid Valve: The tricuspid valve is normal in structure. Tricuspid valve regurgitation is not demonstrated. No evidence of tricuspid stenosis. Aortic Valve: The aortic valve was not assessed. Pulmonic Valve: The pulmonic valve was normal in structure. Pulmonic valve regurgitation is not visualized. No evidence  of pulmonic stenosis. Aorta: The aortic root is normal in size and structure. Venous: The inferior vena cava was not well visualized. IAS/Shunts: No atrial level shunt detected by color flow Doppler. Armanda Magic MD Electronically signed by Armanda Magic MD Signature Date/Time: 03/19/2022/7:50:14 PM    Final    CT Angio Chest Pulmonary Embolism (PE) W or WO Contrast  Result Date: 03/19/2022 CLINICAL DATA:  Elevated D-dimer. High probability for pulmonary embolism. Postop from diaphragmatic hernia repair. EXAM: CT ANGIOGRAPHY CHEST WITH CONTRAST TECHNIQUE: Multidetector CT imaging of the chest was performed using the standard protocol during bolus administration of intravenous contrast. Multiplanar CT image reconstructions and MIPs were obtained to evaluate the vascular anatomy. RADIATION DOSE REDUCTION: This exam was performed according to the departmental dose-optimization program which includes automated exposure control, adjustment of the mA and/or kV according to patient size and/or use of iterative reconstruction technique. CONTRAST:  51mL OMNIPAQUE IOHEXOL 350 MG/ML SOLN COMPARISON:  AP CT on 03/16/2022 FINDINGS: Cardiovascular: Satisfactory opacification of pulmonary arteries noted, and no pulmonary emboli identified. No evidence of thoracic aortic dissection or aneurysm. Large pericardial effusion shows slight increase in size since previous study. Mediastinum/Nodes: No masses or pathologically enlarged lymph nodes identified. Lungs/Pleura: Small bilateral pleural effusions. Bilateral subsegmental atelectasis, greatest in the posterior right lower lobe. Upper abdomen: No acute findings. No residual diaphragmatic or hiatal hernia. Surgical drain remains place. Musculoskeletal: No suspicious bone lesions identified. Review of the MIP images confirms the above findings. IMPRESSION: No evidence of pulmonary embolism. Large pericardial effusion, slightly increase in size since previous study. Small bilateral  pleural effusions and bilateral atelectasis, right side greater than left. These results were called by telephone at the time of interpretation on 03/19/2022 at 5:25 pm to provider Dr. Benito Mccreedy, who verbally acknowledged these results. Electronically Signed   By: Danae Orleans M.D.   On: 03/19/2022 17:23   DG CHEST PORT 1 VIEW  Result Date: 03/19/2022 CLINICAL DATA:  Diaphragmatic hernia repair. EXAM: PORTABLE CHEST 1 VIEW COMPARISON:  03/11/2022 FINDINGS: LEFT IJ central venous catheter with tip overlying the SUPERIOR cavoatrial junction and surgical tube/drain overlying the RIGHT chest/mediastinum again noted. An NG tube has been removed. Low lung volumes and scattered areas of atelectasis again noted. There is no evidence of pneumothorax. Probable trace effusions are again noted. IMPRESSION: NG tube removal without other significant change. No pneumothorax. Electronically Signed   By: Harmon Pier M.D.   On: 03/19/2022 12:50    Anti-infectives: Anti-infectives (From admission, onward)    Start     Dose/Rate Route Frequency Ordered Stop   03/18/22 1645  cefTRIAXone (ROCEPHIN) 2 g in sodium chloride 0.9 % 100 mL IVPB        2 g 200 mL/hr over 30 Minutes Intravenous Every 24 hours  03/18/22 1548 03/20/22 1758   03/18/22 1215  sulfamethoxazole-trimethoprim (BACTRIM DS) 800-160 MG per tablet 1 tablet  Status:  Discontinued        1 tablet Oral Every 12 hours 03/18/22 1123 03/18/22 1548   Mar 28, 2022 1230  vancomycin (VANCOREADY) IVPB 1500 mg/300 mL        1,500 mg 150 mL/hr over 120 Minutes Intravenous On call to O.R. 03/07/22 2150 March 28, 2022 1425   Mar 28, 2022 0600  gentamicin (GARAMYCIN) 300 mg in dextrose 5 % 100 mL IVPB       See Hyperspace for full Linked Orders Report.   300 mg 107.5 mL/hr over 60 Minutes Intravenous On call to O.R. 03/07/22 1321 03/28/22 1430   2022/03/28 0600  Vancomycin (VANCOCIN) 1,500 mg in sodium chloride 0.9 % 500 mL IVPB  Status:  Discontinued        1,500 mg 250 mL/hr over 120  Minutes Intravenous  Once 03/07/22 1424 03/07/22 2154   03/07/22 1415  Vancomycin (VANCOCIN) 1,500 mg in sodium chloride 0.9 % 500 mL IVPB  Status:  Discontinued       See Hyperspace for full Linked Orders Report.   1,500 mg 250 mL/hr over 120 Minutes Intravenous  Once 03/07/22 1321 03/07/22 1425       Assessment/Plan: POD 13, s/p  ROBOTIC RECURRENT ANTERIOR DIAPHRAGMATIC HERNIA REPAIR WITH MESH ROBOTIC LYSIS OF ADHESIONS by Dr. Michaell Cowing 7/18 - D3 diet - Discussed case with TCTS. They recommended leaving surgical drain for now. Output downtrending.  - Mobilize, therapies - IS, pulm toilet - CT with interval repair of right-sided Morgagni hernia with no evidence of postop fluid collections or other complication within the abdomen or pelvis. There is a new moderate to large pericardial effusion. Cardiology plans limited repeat echo today to F/U    FEN - D3 diet, IVF per primary VTE - Lovenox ID - Rocephin for E. Coli UTI   - Per primary team -  Fall 7/26 - denies head trauma or loc. Notified primary team.  Prader-Willi syndrome MR HLD OSA HTN HLD Hypothyroidism Lymphedema   I spoke with family at the bedside  LOS: 15 days    Violeta Gelinas, MD, MPH, FACS Trauma & General Surgery Use [email or web address] to contact on call provider  02/28/2022

## 2022-03-21 NOTE — Plan of Care (Signed)

## 2022-03-21 NOTE — Progress Notes (Addendum)
Progress Note  Patient Name: Barbara Ward Date of Encounter: 2022/03/31  [facility] Cardiologist: Olga Millers, MD   Subjective   Pt appears uncomfortable, not hypoxic, but states she is much more short of breath this morning.   Inpatient Medications    Scheduled Meds:  acetaminophen  1,000 mg Oral Q6H   ALPRAZolam  0.25 mg Oral BID   atorvastatin  40 mg Oral q1800   busPIRone  15 mg Oral BID   Chlorhexidine Gluconate Cloth  6 each Topical Daily   cholecalciferol  2,000 Units Oral Daily   citalopram  40 mg Oral Daily   colchicine  0.6 mg Oral BID   enoxaparin (LOVENOX) injection  40 mg Subcutaneous Q24H   fluticasone  1 spray Each Nare QPM   levothyroxine  137 mcg Oral Q0600   lip balm   Topical BID   methocarbamol  500 mg Oral TID   polyethylene glycol  17 g Oral Daily   senna  1 tablet Oral BID   sodium chloride flush  10-40 mL Intracatheter Q12H   Continuous Infusions:  sodium chloride 1.5 Units/hr (03/18/22 1649)   ondansetron (ZOFRAN) IV     PRN Meds: albuterol, alum & mag hydroxide-simeth, bisacodyl, fentaNYL (SUBLIMAZE) injection, guaiFENesin, magic mouthwash, menthol-cetylpyridinium, ondansetron (ZOFRAN) IV **OR** ondansetron (ZOFRAN) IV, mouth rinse, oxyCODONE, phenol, prochlorperazine, simethicone, sodium chloride flush   Vital Signs    Vitals:   03/20/22 0500 03/20/22 1941 2022-03-31 0622 2022-03-31 0831  BP: 118/64 116/74 127/68 123/69  Pulse: 95 90  (!) 109  Resp: 20 (!) 23 18 20   Temp: 98.2 F (36.8 C) 98.1 F (36.7 C) 98.3 F (36.8 C) 97.9 F (36.6 C)  TempSrc: Oral Oral Oral Oral  SpO2: 94% 95% 91% 93%  Weight:      Height:        Intake/Output Summary (Last 24 hours) at March 31, 2022 0845 Last data filed at 2022/03/31 03/23/2022 Gross per 24 hour  Intake 240 ml  Output --  Net 240 ml      03/04/2022   11:01 AM 02/28/2018   10:01 AM  Last 3 Weights  Weight (lbs) 175 lb 175 lb  Weight (kg) 79.379 kg 79.379 kg      Telemetry    Sinus  tachycardia with HR 100-110s  - Personally Reviewed  ECG    No new tracings - Personally Reviewed  Physical Exam   GEN: No acute distress.   Neck: No JVD Cardiac: RRR, no murmurs, rubs, or gallops.  Respiratory: respirations unlabored GI: Soft, nontender, non-distended  MS: chronic lymphedema Neuro:  Nonfocal  Psych: Normal affect   Labs    High Sensitivity Troponin:   Recent Labs  Lab 03/10/22 0815 03/10/22 1004  TROPONINIHS 220* 222*     Chemistry Recent Labs  Lab 03/19/22 0408 03/20/22 0410 03-31-2022 0550  NA 135 137 139  K 4.1 4.0 3.8  CL 101 103 103  CO2 28 29 29   GLUCOSE 130* 110* 122*  BUN 17 15 10   CREATININE 0.77 0.46 0.45  CALCIUM 8.9 8.8* 9.0  GFRNONAA >60 >60 >60  ANIONGAP 6 5 7     Lipids No results for input(s): "CHOL", "TRIG", "HDL", "LABVLDL", "LDLCALC", "CHOLHDL" in the last 168 hours.  Hematology Recent Labs  Lab 03/19/22 0408 03/20/22 0410 03/31/2022 0550  WBC 18.5* 19.5* 16.6*  RBC 2.94* 2.92* 3.05*  HGB 9.2* 9.1* 9.4*  HCT 27.8* 28.1* 30.2*  MCV 94.6 96.2 99.0  MCH 31.3 31.2 30.8  MCHC 33.1 32.4 31.1  RDW 14.6 14.7 15.3  PLT 582* 613* 664*   Thyroid No results for input(s): "TSH", "FREET4" in the last 168 hours.  BNPNo results for input(s): "BNP", "PROBNP" in the last 168 hours.  DDimer No results for input(s): "DDIMER" in the last 168 hours.   Radiology    ECHOCARDIOGRAM LIMITED  Result Date: 03/19/2022    ECHOCARDIOGRAM LIMITED REPORT   Patient Name:   Barbara Ward Date of Exam: 03/19/2022 Medical Rec #:  [id number]     Height:       56.0 in Accession #:    [id number]    Weight:       175.0 lb Date of Birth:  Mar 30, 1971     BSA:          1.677 m Patient Age:    51 years      BP:           119/73 mmHg Patient Gender: F             HR:           79 bpm. Exam Location:  Inpatient Procedure: Limited Echo Indications:    Pericardial effusion  History:        Patient has prior history of Echocardiogram examinations.                  Pericardial Disease.  Sonographer:    DBB Referring Phys: [id number] Marquita Palms VINCENT IMPRESSIONS  1. Left ventricular ejection fraction, by estimation, is 65 to 70%. The left ventricle has normal function. The left ventricle has no regional wall motion abnormalities.  2. Right ventricular systolic function is normal. The right ventricular size is normal.  3. There is a moderate to large circumferential pericardial effusion which measures 2.28 cm at greatest diameter posteriorly. There is<25% rertrophasic variation in the Mitral valve E inflow velocity. No diastolic RV collapse is present. The IVC is not visualized. There is no definitive evidence of cardiac tamponade although the effusion has increased in size compared to echo 03/17/22. FINDINGS  Left Ventricle: Left ventricular ejection fraction, by estimation, is 65 to 70%. The left ventricle has normal function. The left ventricle has no regional wall motion abnormalities. The left ventricular internal cavity size was normal in size. There is  no left ventricular hypertrophy. Right Ventricle: The right ventricular size is normal. No increase in right ventricular wall thickness. Right ventricular systolic function is normal. Left Atrium: Left atrial size was normal in size. Right Atrium: Right atrial size was normal in size. Pericardium: There pericardial effusion measures 2.28cm at greatest diameter posteriorly. There is a change in mitral valve inflow velocity with respirations. The IVC is not visualized. A moderately sized pericardial effusion is present. The pericardial effusion is circumferential. There is diastolic collapse of the right atrial wall. There is no evidence of cardiac tamponade. Mitral Valve: The mitral valve was not assessed. Tricuspid Valve: The tricuspid valve is normal in structure. Tricuspid valve regurgitation is not demonstrated. No evidence of tricuspid stenosis. Aortic Valve: The aortic valve was not assessed. Pulmonic Valve:  The pulmonic valve was normal in structure. Pulmonic valve regurgitation is not visualized. No evidence of pulmonic stenosis. Aorta: The aortic root is normal in size and structure. Venous: The inferior vena cava was not well visualized. IAS/Shunts: No atrial level shunt detected by color flow Doppler. Armanda Magic MD Electronically signed by Armanda Magic MD Signature Date/Time: 03/19/2022/7:50:14 PM    Final  CT Angio Chest Pulmonary Embolism (PE) W or WO Contrast  Result Date: 03/19/2022 CLINICAL DATA:  Elevated D-dimer. High probability for pulmonary embolism. Postop from diaphragmatic hernia repair. EXAM: CT ANGIOGRAPHY CHEST WITH CONTRAST TECHNIQUE: Multidetector CT imaging of the chest was performed using the standard protocol during bolus administration of intravenous contrast. Multiplanar CT image reconstructions and MIPs were obtained to evaluate the vascular anatomy. RADIATION DOSE REDUCTION: This exam was performed according to the departmental dose-optimization program which includes automated exposure control, adjustment of the mA and/or kV according to patient size and/or use of iterative reconstruction technique. CONTRAST:  9mL OMNIPAQUE IOHEXOL 350 MG/ML SOLN COMPARISON:  AP CT on 03/16/2022 FINDINGS: Cardiovascular: Satisfactory opacification of pulmonary arteries noted, and no pulmonary emboli identified. No evidence of thoracic aortic dissection or aneurysm. Large pericardial effusion shows slight increase in size since previous study. Mediastinum/Nodes: No masses or pathologically enlarged lymph nodes identified. Lungs/Pleura: Small bilateral pleural effusions. Bilateral subsegmental atelectasis, greatest in the posterior right lower lobe. Upper abdomen: No acute findings. No residual diaphragmatic or hiatal hernia. Surgical drain remains place. Musculoskeletal: No suspicious bone lesions identified. Review of the MIP images confirms the above findings. IMPRESSION: No evidence of pulmonary  embolism. Large pericardial effusion, slightly increase in size since previous study. Small bilateral pleural effusions and bilateral atelectasis, right side greater than left. These results were called by telephone at the time of interpretation on 03/19/2022 at 5:25 pm to provider Dr. Benito Mccreedy, who verbally acknowledged these results. Electronically Signed   By: Danae Orleans M.D.   On: 03/19/2022 17:23   DG CHEST PORT 1 VIEW  Result Date: 03/19/2022 CLINICAL DATA:  Diaphragmatic hernia repair. EXAM: PORTABLE CHEST 1 VIEW COMPARISON:  03/11/2022 FINDINGS: LEFT IJ central venous catheter with tip overlying the SUPERIOR cavoatrial junction and surgical tube/drain overlying the RIGHT chest/mediastinum again noted. An NG tube has been removed. Low lung volumes and scattered areas of atelectasis again noted. There is no evidence of pneumothorax. Probable trace effusions are again noted. IMPRESSION: NG tube removal without other significant change. No pneumothorax. Electronically Signed   By: Harmon Pier M.D.   On: 03/19/2022 12:50    Cardiac Studies    Limited echo 03/19/22: 1. Left ventricular ejection fraction, by estimation, is 65 to 70%. The  left ventricle has normal function. The left ventricle has no regional  wall motion abnormalities.   2. Right ventricular systolic function is normal. The right ventricular  size is normal.   3. There is a moderate to large circumferential pericardial effusion  which measures 2.28 cm at greatest diameter posteriorly. There is<25%  rertrophasic variation in the Mitral valve E inflow velocity. No diastolic  RV collapse is present. The IVC is not  visualized. There is no definitive evidence of cardiac tamponade although  the effusion has increased in size compared to echo 03/17/22.    Limited echo 03/17/22: 1. Left ventricular ejection fraction, by estimation, is 55 to 60%. The  left ventricle has normal function. The left ventricle has no regional  wall  motion abnormalities.   2. Right ventricular systolic function is mildly reduced. The right  ventricular size is normal. Tricuspid regurgitation signal is inadequate  for assessing PA pressure.   3. The inferior vena cava is normal in size with <50% respiratory  variability, suggesting right atrial pressure of 8 mmHg.   4. < 25% respirophasic variation of mitral valve E inflow velocity. No RV  diastolic collapse noted. The IVC is not significantly dilated. No  evidence for tamponade though the pericardial effusion is larger than on  the prior study. Moderate pericardial  effusion. The pericardial effusion is circumferential.    Echo 03/10/22: 1. Left ventricular ejection fraction, by estimation, is 55 to 60%. The  left ventricle has normal function. The left ventricle has no regional  wall motion abnormalities. Left ventricular diastolic parameters were  normal.   2. Right ventricular systolic function is mildly reduced. The right  ventricular size is normal.   3. Pericarial effusion measuring up to 0.7 cm. a small pericardial  effusion is present. The pericardial effusion is posterior and lateral to  the left ventricle. There is no evidence of cardiac tamponade.   4. The mitral valve is normal in structure. No evidence of mitral valve  regurgitation. No evidence of mitral stenosis.   5. The aortic valve is tricuspid. Aortic valve regurgitation is not  visualized. No aortic stenosis is present.  Patient Profile     51 y.o. female  with a hx of recurrent diaphragmatic hernia, Prader-Willi syndrome, intellectual disability, hyperlipidemia, OSA, and thyroid disease who is being seen 03/18/2022 for the evaluation of pericardial effusion following recurrent diaphragmatic hernia with SBO repair with lysis of adhesions in the mediastinum and pleura.   Assessment & Plan    Pericardial effusion - possibly related to irritation of the pericardium during surgery (adhesion lysis) - pericardial  effusion initially seen on CT scan, confirmed with echo, initially small, but now moderate to large - pt was started on colchicine 0.6 mg BID - pt remains tachycardic, but again could be related to pain - she appears uncomfortable and reports worsening shortness of breath - will repeat a limited echo today   UTI - acute complicate ABX per primary team   SBO, incarcerated anterior diaphragmatic hernia S/p laparoscopic repair 03/18/2022   OSA on CPAP Pt father at bedside states she has not been on CPAP in the hospital - defer to primary     For questions or updates, please contact [facility] Please consult [email or web address] for contact info under     Signed, Ledora Bottcher, PA  03/01/2022, 8:45 AM    History and all data above reviewed.  Patient examined.  I agree with the findings as above.   She reports that she is more SOB.   The patient exam reveals COR:RRR  ,  Lungs: Decreased breath sounds  ,  Abd: Positive bowel sounds, no rebound no guarding, Ext Mild edema  .  All available labs, radiology testing, previous records reviewed. Agree with documented assessment and plan. Pericardial effusion:  Increased SOB.  Repeat limited echo.  I suspect (looking at the progression of the effusion between 7/20 and 7/29) that she will need pericardiocentesis. I spoke with her father and the patient about this.  Unable to to assess bedside pulsus.   BP was lower this morning.    Jeneen Rinks Janah Mcculloh  11:08 AM  03/17/2022  Addendum:  I reviewed the echo with my non invasive and interventional partners.  The effusion is large but not increasing and appears to be mostly posterior.  This would be difficult to approach percutaneously.  I discussed this with the patients guardian/father.  I do not think there is a critical indication for a surgical window and her father would prefer a conservative approach.  I will follow this with repeat out patient echocardiograms.

## 2022-03-21 NOTE — Progress Notes (Signed)
Physical Therapy Treatment Patient Details Name: Barbara Ward MRN: [id number] DOB: [birth year] Today's Date: 02/19/2022   History of Present Illness Pt is a 51 y/o female admitted 7/15 for abdominal pain due to a very large diaphramatic hernia with evidence of SBO.  Hernia repair 7/18 and left intubated  7/19  extubated.  PMHx:  MR, Prader-Willi syndrome    PT Comments    Patient initially refusing all therapy, however with father's and PT's encouragement agreed to sit EOB. As began moving legs toward EOB, pt reported feeling dizzy with incr RR noted (44 bpm). BP checked 92/57 (62) with sats 91% on 2L. Initiated LE and UE exercises to try to increase BP prior to sitting with pt then reporting she needed to use bedpan. Assisted on/off bedpan with mod assist for rolling (sats dropping to 88% when HOB flat for rolling). Patient cleaned and pulled up in bed as session concluded. Discussed father's concerns re: her refusing to mobilize and he really wants her to return to her group home where she is known and in his estimation be cared for.     Recommendations for follow up therapy are one component of a multi-disciplinary discharge planning process, led by the attending physician.  Recommendations may be updated based on patient status, additional functional criteria and insurance authorization.  Follow Up Recommendations  Skilled nursing-short term rehab (<3 hours/day) (family hoping she can return to group home where she is known) Can patient physically be transported by private vehicle: No   Assistance Recommended at Discharge Frequent or constant Supervision/Assistance  Patient can return home with the following A little help with bathing/dressing/bathroom;Assistance with cooking/housework;Assist for transportation;Help with stairs or ramp for entrance;A lot of help with walking and/or transfers   Equipment Recommendations  Wheelchair cushion (measurements PT);Hospital bed;Wheelchair  (measurements PT)    Recommendations for Other Services       Precautions / Restrictions Precautions Precautions: Fall Precaution Comments: JP drain Restrictions Weight Bearing Restrictions: No     Mobility  Bed Mobility Overal bed mobility: Needs Assistance Bed Mobility: Rolling Rolling: Mod assist         General bed mobility comments: vc for sequencing and assist to fully roll hips over for on/off BSC    Transfers                   General transfer comment: pt refused to sit EOB    Ambulation/Gait                   Stairs             Wheelchair Mobility    Modified Rankin (Stroke Patients Only)       Balance                                            Cognition Arousal/Alertness: Awake/alert Behavior During Therapy: Anxious Overall Cognitive Status: History of cognitive impairments - at baseline                                 General Comments: pt has a legal guardian        Exercises General Exercises - Lower Extremity Ankle Circles/Pumps: AAROM, Both, 10 reps, Supine (followed by heel cord stretch x 30 sec each; limited ROM (~10 degrees shy of neutral  DF)) Heel Slides: AAROM, Both, 10 reps, Supine    General Comments General comments (skin integrity, edema, etc.): pt with RR 35-44 with incr anxiety; unable to slow her breathing with cues; father present and encouraged pt to participate, however pt refusing      Pertinent Vitals/Pain Pain Assessment Pain Assessment: Faces Faces Pain Scale: Hurts little more Pain Location: general with ROM Pain Descriptors / Indicators: Guarding, Grimacing Pain Intervention(s): Limited activity within patient's tolerance, Monitored during session, Repositioned    Home Living                          Prior Function            PT Goals (current goals can now be found in the care plan section) Acute Rehab PT Goals Patient Stated Goal: be  able to do myself PT Goal Formulation: Patient unable to participate in goal setting Time For Goal Achievement: 04/16/2022 Potential to Achieve Goals: Good Progress towards PT goals: Not progressing toward goals - comment    Frequency    Min 3X/week      PT Plan Current plan remains appropriate    Co-evaluation              AM-PAC PT "6 Clicks" Mobility   Outcome Measure  Help needed turning from your back to your side while in a flat bed without using bedrails?: A Lot Help needed moving from lying on your back to sitting on the side of a flat bed without using bedrails?: A Lot Help needed moving to and from a bed to a chair (including a wheelchair)?: A Lot Help needed standing up from a chair using your arms (e.g., wheelchair or bedside chair)?: A Lot Help needed to walk in hospital room?: Total Help needed climbing 3-5 steps with a railing? : Total 6 Click Score: 10    End of Session   Activity Tolerance: Treatment limited secondary to medical complications (Comment) (anxious, elevated RR) Patient left: with call bell/phone within reach;with family/visitor present;in bed;with bed alarm set Nurse Communication: Mobility status PT Visit Diagnosis: Other abnormalities of gait and mobility (R26.89);Muscle weakness (generalized) (M62.81)     Time: 2585-2778 PT Time Calculation (min) (ACUTE ONLY): 33 min  Charges:  $Therapeutic Exercise: 8-22 mins $Therapeutic Activity: 8-22 mins                      Jerolyn Center, PT Acute Rehabilitation Services  Office [phone]    [name] 03/19/2022, 11:37 AM

## 2022-03-21 NOTE — Progress Notes (Addendum)
HD#15 Subjective:   Summary: [name] reports continued SOB but not worsening. She has some difficulty between sentences when speaking. Still has some abdominal tenderness s/p hernia repair. She was concerned about leaving the hospital but reassured her that we will care for her until she is ready. Father was at bedside.   Overnight Events: none    Objective:  Vital signs in last 24 hours: Vitals:   03/20/22 1941 03/10/2022 0622 02/20/2022 0831 03/04/2022 1238  BP: 116/74 127/68 123/69 97/77  Pulse: 90  (!) 109 95  Resp: (!) 23 18 20 20   Temp: 98.1 F (36.7 C) 98.3 F (36.8 C) 97.9 F (36.6 C) 98 F (36.7 C)  TempSrc: Oral Oral Oral Oral  SpO2: 95% 91% 93% (!) 85%  Weight:      Height:        Physical Exam:  Constitutional: female sitting in bed, in no acute distress HENT: normocephalic atraumatic Neck: supple, no JVD Cardiovascular: regular rate and rhythm, no m/r/g, no muffled sounds Abdominal: diffuse tenderness, non-distended, no guarding or rebound Neurological: alert & oriented x 3 Skin: warm and dry Psych: Pleasant, normal mood and behavior.  Filed Weights   02/23/2022 1101  Weight: 79.4 kg     Intake/Output Summary (Last 24 hours) at 03/16/2022 1405 Last data filed at 03/15/2022 03/23/2022 Gross per 24 hour  Intake 240 ml  Output --  Net 240 ml   Net IO Since Admission: 3,504.09 mL [02/23/2022 1405]  Pertinent Labs:    Latest Ref Rng & Units 02/24/2022    5:50 AM 03/20/2022    4:10 AM 03/19/2022    4:08 AM  CBC  WBC 4.0 - 10.5 K/uL 16.6  19.5  18.5   Hemoglobin 12.0 - 15.0 g/dL 9.4  9.1  9.2   Hematocrit 36.0 - 46.0 % 30.2  28.1  27.8   Platelets 150 - 400 K/uL 664  613  582        Latest Ref Rng & Units 03/07/2022    5:50 AM 03/20/2022    4:10 AM 03/19/2022    4:08 AM  CMP  Glucose 70 - 99 mg/dL 02/26/2022  295  621   BUN 6 - 20 mg/dL 10  15  17    Creatinine 0.44 - 1.00 mg/dL 308   6.57   Sodium 135 - 145 mmol/L 139  137  135   Potassium 3.5 - 5.1  mmol/L 3.8  4.0  4.1   Chloride 98 - 111 mmol/L 103  103  101   CO2 22 - 32 mmol/L 29  29  28    Calcium 8.9 - 10.3 mg/dL 9.0  8.8  8.9     Imaging: ECHOCARDIOGRAM LIMITED  Result Date: 03/20/2022    ECHOCARDIOGRAM LIMITED REPORT   Patient Name:   Barbara Ward Date of Exam: 02/27/2022 Medical Rec #:  03/23/2022     Height:       56.0 in Accession #:    Alvera Singh    Weight:       175.0 lb Date of Birth:  October 23, 1970     BSA:          1.677 m Patient Age:    51 years      BP:           97/52 mmHg Patient Gender: F             HR:           99  bpm. Exam Location:  Inpatient Procedure: Limited Echo and Cardiac Doppler Indications:    Pericardial effusion I31.3  History:        Patient has prior history of Echocardiogram examinations, most                 recent 03/19/2022. Risk Factors:Sleep Apnea and Dyslipidemia.                 Thyroid disease. Recent hernia repair.  Sonographer:    Leta Jungling RDCS Referring Phys: [id number] [facility]INCENT IMPRESSIONS  1. There is a large, circumferential pericardial effusion. The effusion is greatest around the posterolateral part of the LV (near apex) measuring up to 2.7cm at end-diastole. There is no RV or RA diastolic collapse. There is <25% respirophasic variation of mitral E inflow velocity. IVC is incompletely visualized but appears dilated and does not vary significantly with inspiration. There is no evidence of tamponade on current study, however, progression of effusion size is significant as it is now 2.7cm and was only 0.7cm on echo on 03/10/22 and 2.3cm on 03/19/22. There is no evidence of cardiac tamponade.  2. Left ventricular ejection fraction, by estimation, is 60 to 65%. The left ventricle has normal function.  3. The right ventricular size is normal. Comparison(s): Prior images reviewed side by side. Compared to prior TTE on 07/20 and 07/29, the effusion continues to enlarge as detailed above. FINDINGS  Left Ventricle: Left ventricular ejection  fraction, by estimation, is 60 to 65%. The left ventricle has normal function. The left ventricular internal cavity size was normal in size. Right Ventricle: The right ventricular size is normal. Pericardium: There is a large, circumferential pericardial effusion. The effusion is greatest around the posterolateral part of the LV (near apex) measuring up to 2.7cm at end-diastole. There is no RV or RA diastolic collapse. There is <25% respirophasic  variation of mitral E inflow velocity. IVC is incompletely visualized but appears dilated and does not vary significantly with inspiration. There is no evidence of tamponade on current study, however, progression of effusion size is significant as it is  now 2.7cm and was only 0.7cm on echo on 03/10/22 and 2.3cm on 03/19/22. There is no evidence of cardiac tamponade. Tricuspid Valve: Tricuspid valve regurgitation is mild. 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 with a pertinent PMH of Prader-Willi syndrome, SBO secondary to anterior diaphragmatic hernia, who is s/p hernia repair with hospital course complicated by UTI and pericardial effusion.   Circumferential moderate to large pericardial effusion Echocardiogram today showed increasing size of effusion, 2.7 cm around posterolateral part of LV. No signs of tamponade. Continue to monitor with serial TTE.  Appreciate input from cardiology. -continue Colchicine 0.6 mg po BID -repeat TTE  tomorrow  Tachycardia Tachypnea Causes could be multifactorial including abdominal pain, deconditioning after surgery, atelectasis, UTI, and enlarging pericardial effusion. Completed antibiotics for UTI. Serial TTE for pericardial effusion and signs of tamponade. CTA PE was negative. PT is working with her, recommends skilled  nursing-short term rehab.  -continue working with PT -continue using incentive spirometry  Abdominal Pain Small bowel obstruction, resolved Incarcerated anterior diaphragmatic hernia, s/p laproscopic repair on 03/03/2022 Continues to have diffuse pain. Patient is having bowel movements. CT A/P reassuring. Exam showed diffuse tenderness to palpation, no guarding or distention. JP drain has minimal dark bloody fluid.  -continue acetaminophen 1000 mg po every 6 hours -oxycodone and IV fentanyl for breakthrough severe pain  Acute uncomplicated UTI Pan-sensitive E. coli, treated with 3 day course of ceftriaxone. Will continue to monitor.   Hyperlipidemia -continue atorvastatin 40 mg daily  Hypothyroidism TSH 0.474 at admission -continue synthroid 137 mcg daily  Vitamin D deficiency -continue cholecalciferol   Anxiety -continue home alprazolam, BuSpar and citalopram  Diet: Normal IVF: NS,10cc/hr VTE: Enoxaparin Code: Full PT/OT recs:  SNF-short term rehab , wheelchair and hospital bed. Family Update: father at bedside   Dispo: Anticipated discharge to  Group home  pending status of pericardial effusion.   Rana Snare, DO Internal Medicine Resident PGY-1 Please contact the on call pager after 5 pm and on weekends at [phone].

## 2022-03-21 NOTE — Progress Notes (Signed)
  Echocardiogram 2D Echocardiogram has been performed.  Barbara Ward M     Echocardiogram not complete, patient undergoing physical therapy.  Barbara Ward

## 2022-03-22 ENCOUNTER — Inpatient Hospital Stay (HOSPITAL_COMMUNITY): Payer: Medicare Other

## 2022-03-22 ENCOUNTER — Other Ambulatory Visit: Payer: Self-pay | Admitting: Physician Assistant

## 2022-03-22 DIAGNOSIS — D649 Anemia, unspecified: Secondary | ICD-10-CM

## 2022-03-22 DIAGNOSIS — I3139 Other pericardial effusion (noninflammatory): Secondary | ICD-10-CM

## 2022-03-22 DIAGNOSIS — R000 Tachycardia, unspecified: Secondary | ICD-10-CM | POA: Diagnosis not present

## 2022-03-22 DIAGNOSIS — R109 Unspecified abdominal pain: Secondary | ICD-10-CM

## 2022-03-22 DIAGNOSIS — R0682 Tachypnea, not elsewhere classified: Secondary | ICD-10-CM | POA: Diagnosis not present

## 2022-03-22 LAB — CULTURE, BLOOD (ROUTINE X 2)
Culture: NO GROWTH
Culture: NO GROWTH
Special Requests: ADEQUATE
Special Requests: ADEQUATE

## 2022-03-22 LAB — BASIC METABOLIC PANEL
Anion gap: 5 (ref 5–15)
BUN: 13 mg/dL (ref 6–20)
CO2: 29 mmol/L (ref 22–32)
Calcium: 8.8 mg/dL — ABNORMAL LOW (ref 8.9–10.3)
Chloride: 103 mmol/L (ref 98–111)
Creatinine, Ser: 0.6 mg/dL (ref 0.44–1.00)
GFR, Estimated: >60 mL/min (ref >60)
Glucose, Bld: 114 mg/dL — ABNORMAL HIGH (ref 70–99)
Potassium: 4.1 mmol/L (ref 3.5–5.1)
Sodium: 137 mmol/L (ref 135–145)

## 2022-03-22 LAB — HEPATIC FUNCTION PANEL
ALT: 86 U/L — ABNORMAL HIGH (ref 0–44)
AST: 43 U/L — ABNORMAL HIGH (ref 15–41)
Albumin: 2 g/dL — ABNORMAL LOW (ref 3.5–5.0)
Alkaline Phosphatase: 218 U/L — ABNORMAL HIGH (ref 38–126)
Bilirubin, Direct: <0.1 mg/dL (ref 0.0–0.2)
Total Bilirubin: 0.4 mg/dL (ref 0.3–1.2)
Total Protein: 5.7 g/dL — ABNORMAL LOW (ref 6.5–8.1)

## 2022-03-22 LAB — CBC
HCT: 30.8 % — ABNORMAL LOW (ref 36.0–46.0)
Hemoglobin: 9.3 g/dL — ABNORMAL LOW (ref 12.0–15.0)
MCH: 30.4 pg (ref 26.0–34.0)
MCHC: 30.2 g/dL (ref 30.0–36.0)
MCV: 100.7 fL — ABNORMAL HIGH (ref 80.0–100.0)
Platelets: 677 10*3/uL — ABNORMAL HIGH (ref 150–400)
RBC: 3.06 MIL/uL — ABNORMAL LOW (ref 3.87–5.11)
RDW: 15.5 % (ref 11.5–15.5)
WBC: 14.5 10*3/uL — ABNORMAL HIGH (ref 4.0–10.5)
nRBC: 0 % (ref 0.0–0.2)

## 2022-03-22 LAB — PHOSPHORUS: Phosphorus: 3.4 mg/dL (ref 2.5–4.6)

## 2022-03-22 MED ORDER — POLYETHYLENE GLYCOL 3350 17 G PO PACK
17.0000 g | PACK | Freq: Two times a day (BID) | ORAL | Status: DC
  Administered 2022-03-22: 17 g via ORAL
  Filled 2022-03-22: qty 1

## 2022-03-22 MED ORDER — IPRATROPIUM BROMIDE 0.02 % IN SOLN
0.5000 mg | Freq: Four times a day (QID) | RESPIRATORY_TRACT | Status: DC | PRN
Start: 2022-03-22 — End: 2022-03-25
  Administered 2022-03-22: 0.5 mg via RESPIRATORY_TRACT
  Filled 2022-03-22: qty 2.5

## 2022-03-22 NOTE — Progress Notes (Addendum)
Barbara Ward [id number] 30-Jul-1971  CARE TEAM:  PCP: Hurshel PartyMoon, Amy A, NP  Outpatient Care Team: Patient Care Team: Hurshel PartyMoon, Amy A, NP as PCP - General (Internal Medicine) Jens Somrenshaw, Madolyn FriezeBrian S, MD as PCP - Cardiology (Cardiology) Hurshel PartyMoon, Amy A, NP as Nurse Practitioner (Internal Medicine) Kathi DerBrahmbhatt, Parag, MD as Consulting Physician (Gastroenterology) Ovidio KinNewman, David, MD as Consulting Physician (General Surgery)  Inpatient Treatment Team: Treatment Team: Attending Provider: Inez CatalinaMullen, Emily B, MD; Consulting Physician: Bishop Limbocs, Md, MD; Consulting Physician: Karie SodaGross, Izaih Kataoka, MD; Consulting Physician: Migdalia Dkgan, Okoronkwo U, MD; Consulting Physician: Charlotte SanesGonzales, Nicole, MD; Rounding Team: (Rounding), Imts - Lorelle GibbsHerrying, MD; Rounding Team: Lbcardiology, Dory Peruounding, MD; Physical Therapist: Ivar DrapeStout, Ruth E, PT; Technician: Rayfield CitizenWoods, Stephanie R, NT   Problem List:   Principal Problem:   Pericardial effusion Active Problems:   Prader-Willi syndrome   Hypothyroidism   Colonic obstruction s/p reduction of incarcertaed diaphragmatic hernia 03/01/2022   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 Naria Abbey, MD  OR FINDINGS:    Large anterior midline diaphragmatic hernia of more Gagnier incarcerated froth with bowel from the distal ileum to the splenic flexure of the colon.  No evidence of any ischemia but suspicious for transition point there.  Giant hernia sac and right pleura excised to allow the contents to be reduced back in the abdomen.  There was a 8 x 8 cm dry from medic hernia defect.  Somewhat fibrotic.   Primary repair done with 0 of Eli serrated suture in a running fashion.  Tissues fair and given recurrence reinforcement of mesh done with 25 x 20 cm ventral light mesh with the upper half on the diaphragm and the lower half along the subcostal ridge and epigastric region.     Closure of the repair done with a 19 [place] Blake drain with most of the drain going up in the mediastinum especially the right chest.     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  No evidence of any true malrotation but entire right colon and transverse colon completely mobile.  Repositioned to more classic anatomy.  No colopexy done.  No Meckel's diverticulectomy nor appendectomy done given the need for mesh reinforcement repair.    Patient with episode of hypotension and end-tidal CO2 changes concerning.  Being left intubated.  Pulmonary critical care called.  Assessment  Stabilizing status post reduction and we repair with mesh reinforcement of giant anterior diaphragmatic hernia causing colon obstruction  [facility](Hospital Stay = 16 days)  Plan: - Diet as tolerated.  Seems to work better with mechanically softened diet.  -It is been 2 weeks since surgery.  The surgical Blake drain output has been minimal for several days.  There is no major undrained effusion in mediastinal/pleural cavities.  I removed the drain at bedside.  Nurse there.  Patient tolerated well without any pain or discomfort or hypoxia.  I do not want to leave the drain in prolonged given permanent polypropylene mesh in the abdomen cavity and risk of infection  - Mobilize, therapies - IS, pulm toilet -defer to primary service if needed for more aggressive workout  to rule out pneumonitis/pneumonia.  OSA  on CPAP at home - CT with interval repair of right-sided Morgagni hernia with no evidence of postop fluid collections or other complication within the abdomen or pelvis.   -There is a new moderate  pericardial effusion. Cardiology is following.  Seems like they do not feel that is causing tamponade and wished to follow-up expectantly.  Hopefully as nutrition improves and possibly with diuresis, it should go down/resolved.  Most likely reactionary due to the need of dissection off the anterior medial heart of the giant hernia sac cavity and the patient's deconditioned/malnourished state.  NO contraindication to paracentesis from my standpoint but mesh may limit pericardial window approach.  Usually on furosmide - ?restart - defer to Med/Cards  FEN - D3 diet, IVF per primary.  Consider diuresis since she appears anasarcic VTE - Lovenox ID - Rocephin for E. Coli UTI   - Per primary team -  Fall 7/26 - denies head trauma or loc. Notified primary team.  Prader-Willi syndrome MR HLD OSA HTN HLD Hypothyroidism Lymphedema      LOS: 15 days  - [facility] will follow more peripherally.  Can follow-up with me in a few weeks in clinic.    Disposition: TBD Disposition:     I reviewed nursing notes, Consultant critical care notes, last 24 h vitals and pain scores, last 48 h intake and output, last 24 h labs and trends, and last 24 h imaging results. I have reviewed this patient's available data, including medical history, events of note, test results, etc as part of my evaluation.  A significant portion of that time was spent in counseling.  Care during the described time interval was provided by me.  This care required high  level of medical decision making.  03/22/2022    Subjective: (Chief complaint)  Patient sitting up eating breakfast smiling.  Still feels short of breath and worried about going home until she can breathe better.  Nurse just coming into room.  No drain output has been minimal.  No cardiopulmonary events.  Objective:  Vital signs:  Vitals:   03/16/2022 2033 03/12/2022 2037 02/22/2022 2333 03/22/22 0414  BP: 110/77  101/69 115/65  Pulse: (!) 110 100 100 88  Resp: 20  20 20 20   Temp: 97.6 F (36.4 C)  98 F (36.7 C) 98.7 F (37.1 C)  TempSrc: Axillary  Oral Oral  SpO2: 94% 93% 94% 97%  Weight:      Height:        Last BM Date : 03/12/2022  Intake/Output   Yesterday:  07/31 0701 - 08/01 0700 In: 240 [P.O.:240] Out: 210 [Urine:200; Drains:10] This shift:  No intake/output data recorded.  Bowel function:  Flatus: YES  BM:  No  Drain: Serosanguinous -I removed.   Physical Exam:  General: Pt sitting up smiling in no acute distress.   Eyes: PERRL, Sclera clear.  No icterus Neuro: CN II-XII intact w/o focal sensory/motor deficits. Lymph: No head/neck/groin lymphadenopathy Psych:  No delerium/psychosis/paranoia. HENT: Normocephalic, Mucus membranes moist.  No thrush Neck: Supple, No tracheal deviation.  No obvious thyromegaly Chest: No pain to chest wall compression.  Mild conversational dyspnea.  Fair respiratory excursion.  No audible wheezing CV:  Pulses intact.  Regular rhythm.  No major extremity edema MS: Normal AROM mjr joints.  No obvious deformity  Abdomen: Soft.  Nondistended.  Nontender.  No evidence of peritonitis.  No incarcerated hernias.  Ext:  No deformity.  Some swelling edema/anasarca.  2+.  No cyanosis Skin: No petechiae / purpurea.  No major sores.  Warm and dry    Results:   Cultures: Recent Results (from the past 720 hour(s))  MRSA Next Gen by PCR, Nasal     Status: None   Collection Time: 03/06/22  7:36 PM   Specimen: Nasal Mucosa; Nasal Swab  Result Value Ref Range Status   MRSA by PCR Next Gen NOT DETECTED NOT DETECTED Final    Comment: (NOTE) The GeneXpert MRSA Assay (FDA approved for NASAL specimens only), is one component of a comprehensive MRSA colonization surveillance program. It is not intended to diagnose MRSA infection nor to guide or monitor treatment for MRSA infections. Test performance is not FDA approved in patients less than 48 years old. Performed at [facility], [address], [place], [place] [address]   Culture, blood (Routine X 2) w Reflex to ID Panel     Status: None (Preliminary result)   Collection Time: 03/17/22  8:50 AM   Specimen: BLOOD  Result Value Ref Range Status   Specimen Description BLOOD RIGHT ANTECUBITAL  Final   Special Requests   Final    BOTTLES DRAWN AEROBIC AND ANAEROBIC Blood Culture adequate volume   Culture   Final    NO GROWTH 4 DAYS Performed at [facility], [address], [place], [place] [address]    Report Status PENDING  Incomplete  Culture, blood (Routine X 2) w Reflex to ID Panel     Status: None (Preliminary result)   Collection Time: 03/17/22  9:02 AM   Specimen: BLOOD RIGHT ARM  Result Value Ref Range Status   Specimen Description BLOOD RIGHT ARM  Final   Special Requests   Final    BOTTLES DRAWN AEROBIC AND ANAEROBIC Blood Culture adequate volume   Culture   Final    NO GROWTH 4 DAYS Performed at [facility], [address], [place], [place] [address]    Report Status PENDING  Incomplete  Urine Culture     Status: Abnormal   Collection Time: 03/18/22  5:00 AM   Specimen: In/Out Cath Urine  Result Value Ref Range Status   Specimen Description IN/OUT CATH URINE  Final   Special Requests   Final    NONE Performed at [facility], [address], [place], [place] [address]    Culture >=100,000 COLONIES/mL ESCHERICHIA COLI (A)  Final   Report Status 03/20/2022 FINAL  Final   Organism ID, Bacteria ESCHERICHIA COLI (A)  Final      Susceptibility   Escherichia coli - MIC*    AMPICILLIN 4 SENSITIVE Sensitive     CEFAZOLIN <=4 SENSITIVE Sensitive     CEFEPIME <=0.12 SENSITIVE Sensitive     CEFTRIAXONE <=0.25 SENSITIVE Sensitive     CIPROFLOXACIN <=0.25 SENSITIVE Sensitive     GENTAMICIN <=1 SENSITIVE Sensitive     IMIPENEM <=0.25 SENSITIVE Sensitive     NITROFURANTOIN <=16 SENSITIVE Sensitive     TRIMETH/SULFA <=20 SENSITIVE Sensitive     AMPICILLIN/SULBACTAM <=2 SENSITIVE Sensitive      PIP/TAZO <=4 SENSITIVE Sensitive     * >=100,000 COLONIES/mL ESCHERICHIA COLI    Labs: Results for orders placed or performed during the hospital encounter of March 20, 2022 (from the past 48 hour(s))  CBC     Status: Abnormal   Collection Time: 03/03/2022  5:50 AM  Result Value Ref Range   WBC 16.6 (H) 4.0 - 10.5 K/uL   RBC 3.05 (  L) 3.87 - 5.11 MIL/uL   Hemoglobin 9.4 (L) 12.0 - 15.0 g/dL   HCT 78.2 (L) 95.6 - 21.3 %   MCV 99.0 80.0 - 100.0 fL   MCH 30.8 26.0 - 34.0 pg   MCHC 31.1 30.0 - 36.0 g/dL   RDW 08.6 57.8 - 46.9 %   Platelets 664 (H) 150 - 400 K/uL   nRBC 0.0 0.0 - 0.2 %    Comment: Performed at [facility], [address], [place], [place] [address]  Basic metabolic panel     Status: Abnormal   Collection Time: 2022/03/31  5:50 AM  Result Value Ref Range   Sodium 139 135 - 145 mmol/L   Potassium 3.8 3.5 - 5.1 mmol/L   Chloride 103 98 - 111 mmol/L   CO2 29 22 - 32 mmol/L   Glucose, Bld 122 (H) 70 - 99 mg/dL    Comment: Glucose reference range applies only to samples taken after fasting for at least 8 hours.   BUN 10 6 - 20 mg/dL   Creatinine, Ser 8.41 0.44 - 1.00 mg/dL   Calcium 9.0 8.9 - 32.4 mg/dL   GFR, Estimated >40 >10 mL/min    Comment: (NOTE) Calculated using the CKD-EPI Creatinine Equation (2021)    Anion gap 7 5 - 15    Comment: Performed at [facility], [address], [place], [place] [address]  CBC     Status: Abnormal   Collection Time: 03/22/22  4:13 AM  Result Value Ref Range   WBC 14.5 (H) 4.0 - 10.5 K/uL   RBC 3.06 (L) 3.87 - 5.11 MIL/uL   Hemoglobin 9.3 (L) 12.0 - 15.0 g/dL   HCT 66.4 (L) 40.3 - 47.4 %   MCV 100.7 (H) 80.0 - 100.0 fL   MCH 30.4 26.0 - 34.0 pg   MCHC 30.2 30.0 - 36.0 g/dL   RDW 25.9 56.3 - 87.5 %   Platelets 677 (H) 150 - 400 K/uL   nRBC 0.0 0.0 - 0.2 %    Comment: Performed at [facility], [address], [place], [place] [address]  Basic metabolic panel     Status: Abnormal   Collection Time: 03/22/22  4:13  AM  Result Value Ref Range   Sodium 137 135 - 145 mmol/L   Potassium 4.1 3.5 - 5.1 mmol/L   Chloride 103 98 - 111 mmol/L   CO2 29 22 - 32 mmol/L   Glucose, Bld 114 (H) 70 - 99 mg/dL    Comment: Glucose reference range applies only to samples taken after fasting for at least 8 hours.   BUN 13 6 - 20 mg/dL   Creatinine, Ser 9.51 0.44 - 1.00 mg/dL   Calcium 8.8 (L) 8.9 - 10.3 mg/dL   GFR, Estimated >88 >41 mL/min    Comment: (NOTE) Calculated using the CKD-EPI Creatinine Equation (2021)    Anion gap 5 5 - 15    Comment: Performed at [facility], [address], [place], [place] [address]    Imaging / Studies: ECHOCARDIOGRAM LIMITED  Result Date: 03/31/22    ECHOCARDIOGRAM LIMITED REPORT   Patient Name:   Barbara Ward Date of Exam: March 31, 2022 Medical Rec #:  [id number]     Height:       56.0 in Accession #:    [id number]    Weight:       175.0 lb Date of Birth:  1971/01/18     BSA:  1.677 m Patient Age:    51 years      BP:           97/52 mmHg Patient Gender: F             HR:           99 bpm. Exam Location:  Inpatient Procedure: Limited Echo and Cardiac Doppler Indications:    Pericardial effusion I31.3  History:        Patient has prior history of Echocardiogram examinations, most                 recent 03/19/2022. Risk Factors:Sleep Apnea and Dyslipidemia.                 Thyroid disease. Recent hernia repair.  Sonographer:    Leta Jungling RDCS Referring Phys: [id number] [employer] VINCENT IMPRESSIONS  1. There is a large, circumferential pericardial effusion. The effusion is greatest around the posterolateral part of the LV (near apex) measuring up to 2.7cm at end-diastole. There is no RV or RA diastolic collapse. There is <25% respirophasic variation of mitral E inflow velocity. IVC is incompletely visualized but appears dilated and does not vary significantly with inspiration. There is no evidence of tamponade on current study, however, progression of effusion size is  significant as it is now 2.7cm and was only 0.7cm on echo on 03/10/22 and 2.3cm on 03/19/22. There is no evidence of cardiac tamponade.  2. Left ventricular ejection fraction, by estimation, is 60 to 65%. The left ventricle has normal function.  3. The right ventricular size is normal. Comparison(s): Prior images reviewed side by side. Compared to prior TTE on 07/20 and 07/29, the effusion continues to enlarge as detailed above. FINDINGS  Left Ventricle: Left ventricular ejection fraction, by estimation, is 60 to 65%. The left ventricle has normal function. The left ventricular internal cavity size was normal in size. Right Ventricle: The right ventricular size is normal. Pericardium: There is a large, circumferential pericardial effusion. The effusion is greatest around the posterolateral part of the LV (near apex) measuring up to 2.7cm at end-diastole. There is no RV or RA diastolic collapse. There is <25% respirophasic  variation of mitral E inflow velocity. IVC is incompletely visualized but appears dilated and does not vary significantly with inspiration. There is no evidence of tamponade on current study, however, progression of effusion size is significant as it is  now 2.7cm and was only 0.7cm on echo on 03/10/22 and 2.3cm on 03/19/22. There is no evidence of cardiac tamponade. Tricuspid Valve: Tricuspid valve regurgitation is mild. Laurance Flatten MD Electronically signed by Laurance Flatten MD Signature Date/Time: 03/18/2022/12:08:35 PM    Final     Medications / Allergies: per chart  Antibiotics: Anti-infectives (From admission, onward)    Start     Dose/Rate Route Frequency Ordered Stop   03/18/22 1645  cefTRIAXone (ROCEPHIN) 2 g in sodium chloride 0.9 % 100 mL IVPB        2 g 200 mL/hr over 30 Minutes Intravenous Every 24 hours 03/18/22 1548 03/20/22 1758   03/18/22 1215  sulfamethoxazole-trimethoprim (BACTRIM DS) 800-160 MG per tablet 1 tablet  Status:  Discontinued        1 tablet Oral Every  12 hours 03/18/22 1123 03/18/22 1548   03/14/2022 1230  vancomycin (VANCOREADY) IVPB 1500 mg/300 mL        1,500 mg 150 mL/hr over 120 Minutes Intravenous On call to O.R. 03/07/22 2150 03/07/2022 1425  03/12/2022 0600  gentamicin (GARAMYCIN) 300 mg in dextrose 5 % 100 mL IVPB       See Hyperspace for full Linked Orders Report.   300 mg 107.5 mL/hr over 60 Minutes Intravenous On call to O.R. 03/07/22 1321 03/17/2022 1430   03/17/2022 0600  Vancomycin (VANCOCIN) 1,500 mg in sodium chloride 0.9 % 500 mL IVPB  Status:  Discontinued        1,500 mg 250 mL/hr over 120 Minutes Intravenous  Once 03/07/22 1424 03/07/22 2154   03/07/22 1415  Vancomycin (VANCOCIN) 1,500 mg in sodium chloride 0.9 % 500 mL IVPB  Status:  Discontinued       See Hyperspace for full Linked Orders Report.   1,500 mg 250 mL/hr over 120 Minutes Intravenous  Once 03/07/22 1321 03/07/22 1425         Note: Portions of this report may have been transcribed using voice recognition software. Every effort was made to ensure accuracy; however, inadvertent computerized transcription errors may be present.   Any transcriptional errors that result from this process are unintentional.    Ardeth Sportsman, MD, FACS, MASCRS Esophageal, Gastrointestinal & Colorectal Surgery Robotic and Minimally Invasive Surgery  [facility] [address], [address] [place], [place] [address] [phone] Fax [phone] Main  CONTACT INFORMATION:  Weekday (9AM-5PM): Call [facility] main office at [phone]  Weeknight (5PM-9AM) or Weekend/Holiday: Check [email or web address] (password " [email or web address]") for General Surgery [facility] coverage  (Please, do not use SecureChat as it is not reliable communication to reach operating surgeons for immediate patient care)      03/22/2022  7:42 AM

## 2022-03-22 NOTE — TOC Progression Note (Addendum)
Transition of Care [facility]) - Progression Note    Patient Details  Name: Barbara Ward MRN: [id number] Date of Birth: 26-Dec-1970  [facility] CM/SW Contact  Mearl Latin, LCSW Phone Number: 03/22/2022, 3:30 PM  Clinical Narrative:    3:30p-CSW received notification from [facility] that group home is unable to accept patient back in her current condition. CSW submitted clinicals to pasrr for review; pending. Conducting a SNF search.   5pm-CSW spoke with patient's mother and sister Neysa Bonito [phone]). They have spoken with April at the group home and April is willing to see how patient is doing tomorrow as patient is wanting to return to group home and not go to SNF. CSW answered questions about home health services. Family expressed concern about bedsores. CSW notified MD.    Expected Discharge Plan: Home w Home Health Services Barriers to Discharge: Continued Medical Work up  Expected Discharge Plan and Services Expected Discharge Plan: Home w Home Health Services In-house Referral: Clinical Social Work Discharge Planning Services: CM Consult   Living arrangements for the past 2 months: Group Home Expected Discharge Date: 03/15/22               DME Arranged: 3-N-1, Wheelchair manual, Hospital bed DME Agency: [employer] Date DME Agency Contacted: 03/14/22 Time DME Agency Contacted: 1345 Representative spoke with at DME Agency: Lawernce Keas HH Arranged: RN, PT, OT, Nurse's Aide HH Agency: [facility] Date [facility] Agency Contacted: 03/14/22 Time HH Agency Contacted: 1345 Representative spoke with at [facility] Agency: Angie   Social Determinants of Health (SDOH) Interventions    Readmission Risk Interventions     No data to display

## 2022-03-22 NOTE — Progress Notes (Signed)
HD#[id number] Subjective:   Summary: Barbara Ward reports feeling worse today. She felt lightheaded overnight but denies any dizziness or lightheadedness this morning. No LOC. No change today in SOB, on 3L O2 via [place].   Overnight Events: none    Objective:  Vital signs in last 24 hours: Vitals:   16-Apr-2022 2033 04/16/2022 2037 04-16-2022 2333 03/22/22 0414  BP: 110/77  101/69 115/65  Pulse: (!) 110 100 100 88  Resp: 20 20 20 20   Temp: 97.6 F (36.4 C)  98 F (36.7 C) 98.7 F (37.1 C)  TempSrc: Axillary  Oral Oral  SpO2: 94% 93% 94% 97%  Weight:      Height:       Supplemental O2: Nasal Cannula SpO2: 98 % O2 Flow Rate (L/min): 3 L/min FiO2 (%): (!) 3 %   Physical Exam:  Constitutional: alert, laying upright in bed, in no acute distress HENT: normocephalic atraumatic Neck: supple, no JVD Cardiovascular: regular rate and rhythm, no muffled heart sounds Neurological: alert & oriented x 3 Skin: warm and dry Psych: normal mood and behavior  Filed Weights   03/12/2022 1101  Weight: 79.4 kg     Intake/Output Summary (Last 24 hours) at 03/22/2022 0814 Last data filed at Apr 16, 2022 2033 Gross per 24 hour  Intake 240 ml  Output 210 ml  Net 30 ml   Net IO Since Admission: 3,294.09 mL [03/22/22 0814]  Pertinent Labs:    Latest Ref Rng & Units 03/22/2022    4:13 AM 2022/04/16    5:50 AM 03/20/2022    4:10 AM  CBC  WBC 4.0 - 10.5 K/uL 14.5  16.6  19.5   Hemoglobin 12.0 - 15.0 g/dL 9.3  9.4  9.1   Hematocrit 36.0 - 46.0 % 30.8  30.2  28.1   Platelets 150 - 400 K/uL 677  664  613        Latest Ref Rng & Units 03/22/2022    4:13 AM April 16, 2022    5:50 AM 03/20/2022    4:10 AM  CMP  Glucose 70 - 99 mg/dL 03/22/2022  361  443   BUN 6 - 20 mg/dL 13  10  15    Creatinine 0.44 - 1.00 mg/dL 154   0.08   Sodium 135 - 145 mmol/L 137  139  137   Potassium 3.5 - 5.1 mmol/L 4.1  3.8  4.0   Chloride 98 - 111 mmol/L 103  103  103   CO2 22 - 32 mmol/L 29  29  29    Calcium 8.9 - 10.3 mg/dL 8.8   9.0  8.8     Imaging: ECHOCARDIOGRAM LIMITED  Result Date: 16-Apr-2022    ECHOCARDIOGRAM LIMITED REPORT   Patient Name:   Barbara Ward Date of Exam: Apr 16, 2022 Medical Rec #:  03/23/2022     Height:       56.0 in Accession #:    [id number]    Weight:       175.0 lb Date of Birth:  [birth year]     BSA:          1.677 m Patient Age:    51 years      BP:           97/52 mmHg Patient Gender: F             HR:           99 bpm. Exam Location:  Inpatient Procedure: Limited Echo and Cardiac Doppler Indications:  Pericardial effusion I31.3  History:        Patient has prior history of Echocardiogram examinations, most                 recent 03/19/2022. Risk Factors:Sleep Apnea and Dyslipidemia.                 Thyroid disease. Recent hernia repair.  Sonographer:    Leta Jungling RDCS Referring Phys: [id number] [facility] VINCENT IMPRESSIONS  1. There is a large, circumferential pericardial effusion. The effusion is greatest around the posterolateral part of the LV (near apex) measuring up to 2.7cm at end-diastole. There is no RV or RA diastolic collapse. There is <25% respirophasic variation of mitral E inflow velocity. IVC is incompletely visualized but appears dilated and does not vary significantly with inspiration. There is no evidence of tamponade on current study, however, progression of effusion size is significant as it is now 2.7cm and was only 0.7cm on echo on 03/10/22 and 2.3cm on 03/19/22. There is no evidence of cardiac tamponade.  2. Left ventricular ejection fraction, by estimation, is 60 to 65%. The left ventricle has normal function.  3. The right ventricular size is normal. Comparison(s): Prior images reviewed side by side. Compared to prior TTE on 07/20 and 07/29, the effusion continues to enlarge as detailed above. FINDINGS  Left Ventricle: Left ventricular ejection fraction, by estimation, is 60 to 65%. The left ventricle has normal function. The left ventricular internal cavity size was normal in  size. Right Ventricle: The right ventricular size is normal. Pericardium: There is a large, circumferential pericardial effusion. The effusion is greatest around the posterolateral part of the LV (near apex) measuring up to 2.7cm at end-diastole. There is no RV or RA diastolic collapse. There is <25% respirophasic  variation of mitral E inflow velocity. IVC is incompletely visualized but appears dilated and does not vary significantly with inspiration. There is no evidence of tamponade on current study, however, progression of effusion size is significant as it is  now 2.7cm and was only 0.7cm on echo on 03/10/22 and 2.3cm on 03/19/22. There is no evidence of cardiac tamponade. Tricuspid Valve: Tricuspid valve regurgitation is mild. 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 with a pertinent PMH of Prader-Willi syndrome, SBO secondary to anterior diaphragmatic hernia, who is s/p hernia repair with hospital course complicated by UTI and pericardial effusion.   Circumferential moderate to large pericardial effusion Cardiology noted for echocardiogram on 7/31 that pericardial effusioin is large but not increasing in size. No signs of tamponade. Recommends outpatient echocardiogram in 1 week. Appreciate input from cardiology. -continue Colchicine 0.6 mg po BID -cardiology will arrange outpatient echo in [redacted] week along with f/u appt  Tachycardia Tachypnea Causes could be multifactorial  including anemia, abdominal pain, deconditioning after surgery, atelectasis, and enlarging pericardial effusion. Completed antibiotics for UTI. CTA PE was negative. PT is working with her and recommends SNF.  -continue working with PT -continue incentive spirometry  Abdominal Pain Small bowel  obstruction, resolved Incarcerated anterior diaphragmatic hernia, s/p laproscopic repair on 03/07/2022 Continues to have diffuse abdominal pain. Patient is having bowel movements. CT A/P reassuring. Surgery removed JP drain this morning.  -continue acetaminophen 1000 mg po every 6 hours -oxycodone and IV fentanyl for breakthrough severe pain -surgery recommends outpatient f/u in next few weeks  Acute complicated UTI Pan-sensitive E. Coli, treated with 3 day course of ceftriaxone. Will continue to monitor.   Anemia Hemoglobin 9.3. Prior to surgery she was not anemic. Dropped to 8.8 on 7/28 and has been steady ~9. Had minimal dark bloody output from JP drain yesterday. No signs of bleeding. Could be post-op anemia. Possibly contributing to her tachypnea and tachycardia.  -repeat CBC  Hyperlipidemia -continue atorvastatin 40 mg daily  Hypothryoidism TSH 0.474 at admission -continue synthroid 137 mcg daily  Vitamin D deficiency -continue cholecalciferol  Anxiety -continue home alprazolam, BuSpar and citalopram  Diet: Normal IVF: NS,10cc/hr VTE: Enoxaparin Code: Full PT/OT recs:  SNF-short term rehab , wheelchair and hospital bed. Family Update: will need to discuss with parents about SNF and long term care   Dispo: Anticipated discharge to Skilled nursing facility in 1-2 days pending re-evaluation of long term care.   Rana Snare, DO Internal Medicine Resident PGY-1 Please contact the on call pager after 5 pm and on weekends at [phone].

## 2022-03-22 NOTE — Progress Notes (Signed)
RE: Barbara Ward Date of Birth: 1970-09-16 Date: 03/22/22  Please be advised that the above-named patient will require a short-term nursing home stay - anticipated 30 days or less for rehabilitation and strengthening.  The plan is for return home.

## 2022-03-22 NOTE — Progress Notes (Addendum)
Progress Note  Patient Name: Barbara Ward Date of Encounter: 03/22/2022  [facility] Cardiologist: Olga Millers, MD   Subjective   Pt just finished breakfast. She does not want to discharge home yet, stating her heart might stop. No family at bedside this morning. She remains sinus tachycardia and is now on 1L [place]. She required a neb treatment overnight and felt like she was going to pass out. Still without CPAP at night.  Inpatient Medications    Scheduled Meds:  acetaminophen  1,000 mg Oral Q6H   ALPRAZolam  0.25 mg Oral BID   atorvastatin  40 mg Oral q1800   busPIRone  15 mg Oral BID   Chlorhexidine Gluconate Cloth  6 each Topical Daily   cholecalciferol  2,000 Units Oral Daily   citalopram  40 mg Oral Daily   colchicine  0.6 mg Oral BID   enoxaparin (LOVENOX) injection  40 mg Subcutaneous Q24H   fluticasone  1 spray Each Nare QPM   levothyroxine  137 mcg Oral Q0600   lip balm   Topical BID   methocarbamol  500 mg Oral TID   polyethylene glycol  17 g Oral Daily   senna  1 tablet Oral BID   sodium chloride flush  10-40 mL Intracatheter Q12H   Continuous Infusions:  sodium chloride 1.5 Units/hr (03/18/22 1649)   ondansetron (ZOFRAN) IV     PRN Meds: albuterol, alum & mag hydroxide-simeth, bisacodyl, fentaNYL (SUBLIMAZE) injection, guaiFENesin, magic mouthwash, menthol-cetylpyridinium, ondansetron (ZOFRAN) IV **OR** ondansetron (ZOFRAN) IV, mouth rinse, oxyCODONE, phenol, prochlorperazine, simethicone, sodium chloride flush   Vital Signs    Vitals:   2022/03/28 2033 Mar 28, 2022 2037 Mar 28, 2022 2333 03/22/22 0414  BP: 110/77  101/69 115/65  Pulse: (!) 110 100 100 88  Resp: 20 20 20 20   Temp: 97.6 F (36.4 C)  98 F (36.7 C) 98.7 F (37.1 C)  TempSrc: Axillary  Oral Oral  SpO2: 94% 93% 94% 97%  Weight:      Height:        Intake/Output Summary (Last 24 hours) at 03/22/2022 0754 Last data filed at 2022/03/28 2033 Gross per 24 hour  Intake 240 ml  Output 210 ml   Net 30 ml      03/10/2022   11:01 AM 02/28/2018   10:01 AM  Last 3 Weights  Weight (lbs) 175 lb 175 lb  Weight (kg) 79.379 kg 79.379 kg      Telemetry    Sinus tachycardia with HR 100s - Personally Reviewed  ECG    No new tracings - Personally Reviewed  Physical Exam   GEN: No acute distress.   Neck: No JVD Cardiac: regular rhythm, tachycardic rate, rub appreciated today Respiratory: Clear to auscultation bilaterally. GI: Soft, nontender, non-distended  MS: bilateral LE lymphedema  Neuro:  Nonfocal  Psych: Normal affect   Labs    High Sensitivity Troponin:   Recent Labs  Lab 03/10/22 0815 03/10/22 1004  TROPONINIHS 220* 222*     Chemistry Recent Labs  Lab 03/20/22 0410 28-Mar-2022 0550 03/22/22 0413  NA 137 139 137  K 4.0 3.8 4.1  CL 103 103 103  CO2 29 29 29   GLUCOSE 110* 122* 114*  BUN 15 10 13   CREATININE 0.46 0.45 0.60  CALCIUM 8.8* 9.0 8.8*  GFRNONAA >60 >60 >60  ANIONGAP 5 7 5     Lipids No results for input(s): "CHOL", "TRIG", "HDL", "LABVLDL", "LDLCALC", "CHOLHDL" in the last 168 hours.  Hematology Recent Labs  Lab  03/20/22 0410 03/06/2022 0550 03/22/22 0413  WBC 19.5* 16.6* 14.5*  RBC 2.92* 3.05* 3.06*  HGB 9.1* 9.4* 9.3*  HCT 28.1* 30.2* 30.8*  MCV 96.2 99.0 100.7*  MCH 31.2 30.8 30.4  MCHC 32.4 31.1 30.2  RDW 14.7 15.3 15.5  PLT 613* 664* 677*   Thyroid No results for input(s): "TSH", "FREET4" in the last 168 hours.  BNPNo results for input(s): "BNP", "PROBNP" in the last 168 hours.  DDimer No results for input(s): "DDIMER" in the last 168 hours.   Radiology    ECHOCARDIOGRAM LIMITED  Result Date: 02/27/2022    ECHOCARDIOGRAM LIMITED REPORT   Patient Name:   BARB SHEAR Date of Exam: 03/02/2022 Medical Rec #:  [id number]     Height:       56.0 in Accession #:    [id number]    Weight:       175.0 lb Date of Birth:  April 11, 1971     BSA:          1.677 m Patient Age:    51 years      BP:           97/52 mmHg Patient Gender: F              HR:           99 bpm. Exam Location:  Inpatient Procedure: Limited Echo and Cardiac Doppler Indications:    Pericardial effusion I31.3  History:        Patient has prior history of Echocardiogram examinations, most                 recent 03/19/2022. Risk Factors:Sleep Apnea and Dyslipidemia.                 Thyroid disease. Recent hernia repair.  Sonographer:    Leta Jungling RDCS Referring Phys: [id number] [facility] VINCENT IMPRESSIONS  1. There is a large, circumferential pericardial effusion. The effusion is greatest around the posterolateral part of the LV (near apex) measuring up to 2.7cm at end-diastole. There is no RV or RA diastolic collapse. There is <25% respirophasic variation of mitral E inflow velocity. IVC is incompletely visualized but appears dilated and does not vary significantly with inspiration. There is no evidence of tamponade on current study, however, progression of effusion size is significant as it is now 2.7cm and was only 0.7cm on echo on 03/10/22 and 2.3cm on 03/19/22. There is no evidence of cardiac tamponade.  2. Left ventricular ejection fraction, by estimation, is 60 to 65%. The left ventricle has normal function.  3. The right ventricular size is normal. Comparison(s): Prior images reviewed side by side. Compared to prior TTE on 07/20 and 07/29, the effusion continues to enlarge as detailed above. FINDINGS  Left Ventricle: Left ventricular ejection fraction, by estimation, is 60 to 65%. The left ventricle has normal function. The left ventricular internal cavity size was normal in size. Right Ventricle: The right ventricular size is normal. Pericardium: There is a large, circumferential pericardial effusion. The effusion is greatest around the posterolateral part of the LV (near apex) measuring up to 2.7cm at end-diastole. There is no RV or RA diastolic collapse. There is <25% respirophasic  variation of mitral E inflow velocity. IVC is incompletely visualized but appears dilated  and does not vary significantly with inspiration. There is no evidence of tamponade on current study, however, progression of effusion size is significant as it is  now 2.7cm and was only 0.7cm on echo  on 03/10/22 and 2.3cm on 03/19/22. There is no evidence of cardiac tamponade. Tricuspid Valve: Tricuspid valve regurgitation is mild. Laurance Flatten MD Electronically signed by Laurance Flatten MD Signature Date/Time: April 17, 2022/12:08:35 PM    Final     Cardiac Studies   Limited echo 04-17-2022: 1. There is a large, circumferential pericardial effusion. The effusion  is greatest around the posterolateral part of the LV (near apex) measuring  up to 2.7cm at end-diastole. There is no RV or RA diastolic collapse.  There is <25% respirophasic  variation of mitral E inflow velocity. IVC is incompletely visualized but  appears dilated and does not vary significantly with inspiration. There is  no evidence of tamponade on current study, however, progression of  effusion size is significant as it is  now 2.7cm and was only 0.7cm on echo on 03/10/22 and 2.3cm on 03/19/22.  There is no evidence of cardiac tamponade.   2. Left ventricular ejection fraction, by estimation, is 60 to 65%. The  left ventricle has normal function.   3. The right ventricular size is normal.    Limited echo 03/19/22: 1. Left ventricular ejection fraction, by estimation, is 65 to 70%. The  left ventricle has normal function. The left ventricle has no regional  wall motion abnormalities.   2. Right ventricular systolic function is normal. The right ventricular  size is normal.   3. There is a moderate to large circumferential pericardial effusion  which measures 2.28 cm at greatest diameter posteriorly. There is<25%  rertrophasic variation in the Mitral valve E inflow velocity. No diastolic  RV collapse is present. The IVC is not  visualized. There is no definitive evidence of cardiac tamponade although  the effusion has  increased in size compared to echo 03/17/22.      Limited echo 03/17/22: 1. Left ventricular ejection fraction, by estimation, is 55 to 60%. The  left ventricle has normal function. The left ventricle has no regional  wall motion abnormalities.   2. Right ventricular systolic function is mildly reduced. The right  ventricular size is normal. Tricuspid regurgitation signal is inadequate  for assessing PA pressure.   3. The inferior vena cava is normal in size with <50% respiratory  variability, suggesting right atrial pressure of 8 mmHg.   4. < 25% respirophasic variation of mitral valve E inflow velocity. No RV  diastolic collapse noted. The IVC is not significantly dilated. No  evidence for tamponade though the pericardial effusion is larger than on  the prior study. Moderate pericardial  effusion. The pericardial effusion is circumferential.      Echo 03/10/22: 1. Left ventricular ejection fraction, by estimation, is 55 to 60%. The  left ventricle has normal function. The left ventricle has no regional  wall motion abnormalities. Left ventricular diastolic parameters were  normal.   2. Right ventricular systolic function is mildly reduced. The right  ventricular size is normal.   3. Pericarial effusion measuring up to 0.7 cm. a small pericardial  effusion is present. The pericardial effusion is posterior and lateral to  the left ventricle. There is no evidence of cardiac tamponade.   4. The mitral valve is normal in structure. No evidence of mitral valve  regurgitation. No evidence of mitral stenosis.   5. The aortic valve is tricuspid. Aortic valve regurgitation is not  visualized. No aortic stenosis is present.    Patient Profile     51 y.o. female  with a hx of recurrent diaphragmatic hernia, Prader-Willi syndrome, intellectual disability,  hyperlipidemia, OSA, and thyroid disease who is being seen 03/18/2022 for the evaluation of pericardial effusion following recurrent  diaphragmatic hernia with SBO repair with lysis of adhesions in the mediastinum and pleura.   Assessment & Plan    Pericardial effusion - possibly related to irritation of the pericardium during surgery (adhesion lysis) - pericardial effusion seen on CT scan, confirmed with echo, initially small, but now large - pt was started on colchicine 0.6 mg BID - pt remains tachycardic, but again could be related to pain - case discussed with interventional and non-interventional colleagues - effusion does not appear amenable to pericardiocentesis - I held off on echo today - will arrange outpatient echo in 1 week with appt right after   UTI - acute complicate ABX per primary team     SBO, incarcerated anterior diaphragmatic hernia S/p laparoscopic repair 05-Apr-2022     OSA on CPAP Pt father at bedside states she has not been on CPAP in the hospital - defer to primary      For questions or updates, please contact [facility] Please consult [email or web address] for contact info under        Signed, Marcelino Duster, PA  03/22/2022, 7:54 AM    History and all data above reviewed.  Patient examined.  I agree with the findings as above.   She complains of SOB.  Sats 97% or 2 l.  BPs improved compared to yesterday.   The patient exam reveals COR:RRR,  no rub  ,  Lungs: Decreased breath sounds at the bases.   ,  Abd: Positive bowel sounds, no rebound no guarding, Ext No pitting edema.   .  All available labs, radiology testing, previous records reviewed. Agree with documented assessment and plan. Pericardial effusion:  Could have multifactorial etiology to SOB and would be higher risk for surgical pericardial drainage.  I would suggest mobilize, aggressive pulmonary toilet and monitor O2 sats with activity on and off O2.  If she desats we would need to reconsider pericardial window.    Fayrene Fearing Malissa Slay  12:14 PM  03/22/2022

## 2022-03-22 NOTE — Progress Notes (Signed)
Physical Therapy Treatment Patient Details Name: Barbara Ward MRN: [id number] DOB: April 08, 1971 Today's Date: 03/22/2022   History of Present Illness 51 y/o female admitted 7/15 for abdominal pain due to a very large diaphramatic hernia with evidence of SBO.  Hernia repair 7/18 and left intubated  7/19  extubated. Has new pericardial effusion from combined intake and disuse. PMHx:  MR, Prader-Willi syndrome    PT Comments    Pt was seen for progressing mobility and was noted to have a struggle to tolerate moving legs.  Pt is anxious about her medical concerns and feels she should not have any active moving.  Talked with her about the MD guide lines for PT to move her, and she reports she is willing to do bed ex.  After assisting her to move legs, noted her knee flexion tolerance is minimal and that her L knee is able to generate a lot of strength to extend knee.  Follow along with plan for SNF to continue to work on standing and strengthening to restore her gait with a mechanical lift.  Pt is interested in going directly home per her report.    Recommendations for follow up therapy are one component of a multi-disciplinary discharge planning process, led by the attending physician.  Recommendations may be updated based on patient status, additional functional criteria and insurance authorization.  Follow Up Recommendations  Skilled nursing-short term rehab (<3 hours/day) Can patient physically be transported by private vehicle: No   Assistance Recommended at Discharge Frequent or constant Supervision/Assistance  Patient can return home with the following Two people to help with walking and/or transfers;A little help with bathing/dressing/bathroom;Assistance with cooking/housework;Direct supervision/assist for medications management;Direct supervision/assist for financial management;Assist for transportation;Help with stairs or ramp for entrance   Equipment Recommendations  Wheelchair (measurements  PT);Wheelchair cushion (measurements PT);Hospital bed    Recommendations for Other Services       Precautions / Restrictions Precautions Precautions: Fall Precaution Comments: JP drain Restrictions Weight Bearing Restrictions: No     Mobility  Bed Mobility Overal bed mobility: Needs Assistance             General bed mobility comments: in bed and declining OOB for visit    Transfers Overall transfer level: Needs assistance                 General transfer comment: declined    Ambulation/Gait                   Stairs             Wheelchair Mobility    Modified Rankin (Stroke Patients Only)       Balance                                            Cognition Arousal/Alertness: Awake/alert Behavior During Therapy: Flat affect Overall Cognitive Status: History of cognitive impairments - at baseline                                 General Comments: pt has a legal guardian        Exercises General Exercises - Lower Extremity Ankle Circles/Pumps: AAROM, 10 reps Quad Sets: AROM, 10 reps Gluteal Sets: AROM, 10 reps Heel Slides: AAROM, 10 reps Hip ABduction/ADduction: AAROM, 10 reps  General Comments        Pertinent Vitals/Pain Pain Assessment Pain Assessment: Faces Faces Pain Scale: Hurts little more Facial Expression: Relaxed, neutral Body Movements: Absence of movements Muscle Tension: Relaxed Compliance with ventilator (intubated pts.): N/A Pain Location: ROM exercises esp L knee Pain Descriptors / Indicators: Guarding, Grimacing Pain Intervention(s): Limited activity within patient's tolerance, Monitored during session, Premedicated before session, Repositioned    Home Living                          Prior Function            PT Goals (current goals can now be found in the care plan section) Acute Rehab PT Goals Patient Stated Goal: do things by herself     Frequency    Min 3X/week      PT Plan Current plan remains appropriate    Co-evaluation              AM-PAC PT "6 Clicks" Mobility   Outcome Measure  Help needed turning from your back to your side while in a flat bed without using bedrails?: A Lot Help needed moving from lying on your back to sitting on the side of a flat bed without using bedrails?: A Lot Help needed moving to and from a bed to a chair (including a wheelchair)?: Total Help needed standing up from a chair using your arms (e.g., wheelchair or bedside chair)?: Total Help needed to walk in hospital room?: Total Help needed climbing 3-5 steps with a railing? : Total 6 Click Score: 8    End of Session   Activity Tolerance: Patient tolerated treatment well Patient left: with call bell/phone within reach;in bed;with bed alarm set Nurse Communication: Mobility status PT Visit Diagnosis: Other abnormalities of gait and mobility (R26.89);Muscle weakness (generalized) (M62.81)     Time: 8546-2703 PT Time Calculation (min) (ACUTE ONLY): 24 min  Charges:  $Therapeutic Exercise: 23-37 mins     Ivar Drape 03/22/2022, 1:16 PM  Samul Dada, PT PhD Acute Rehab Dept. Number: [facility] [id number] and [facility] [phone]

## 2022-03-22 NOTE — TOC Progression Note (Addendum)
Transition of Care [facility]) - Progression Note    Patient Details  Name: Barbara Ward MRN: [id number] Date of Birth: May 12, 1971  Transition of Care [facility]) CM/SW Contact  Harriet Masson, RN Phone Number: 03/22/2022, 9:27 AM  Clinical Narrative:     Left VM with mother, [name], to verify discharge plan is still the same. Awaiting return call.  1330 Spoke to April at group home who states the patient can't come back due to physical status.   1500 Phyllis returned call and states she really wants patient to go to group home and that patient is working with PT and is using the bedpan. [name] requested this RNCM reach back out to April. Left a VM with April awaiting return call.  1615 April returned my call and states she will hold the patient's room and if patient decides to walk tomorrow then she may be able to take her back. April will notify [facility]. DME pick up on hold. Lacretia aware.  Expected Discharge Plan: Home w Home Health Services Barriers to Discharge: Continued Medical Work up  Expected Discharge Plan and Services Expected Discharge Plan: Home w Home Health Services In-house Referral: Clinical Social Work Discharge Planning Services: CM Consult   Living arrangements for the past 2 months: Group Home Expected Discharge Date: 03/15/22               DME Arranged: 3-N-1, Wheelchair manual, Hospital bed DME Agency: [employer] Date DME Agency Contacted: 03/14/22 Time DME Agency Contacted: 1345 Representative spoke with at DME Agency: Lawernce Keas HH Arranged: RN, PT, OT, Nurse's Aide HH Agency: [facility] Date [facility] Agency Contacted: 03/14/22 Time HH Agency Contacted: 1345 Representative spoke with at [facility] Agency: Angie   Social Determinants of Health (SDOH) Interventions    Readmission Risk Interventions     No data to display

## 2022-03-22 NOTE — Progress Notes (Signed)
PROGRESS UPDATE  Received message that patient is requesting nebulizer treatment. Evaluated patient at bedside. Reports she feels like she needs a breathing treatment as soon as possible, as she can't catch her breath. Otherwise no new symptoms, no chest pain.  On exam, patient appears comfortable, sitting up to eat dinner. Tachycardic 100-110, normotensive, sating well on 3L nasal cannula. I do not appreciate any wheezing, rales or rhonchi in bilateral lung fields, but airways do sound tight.   Patient is here with moderate-large pericardial effusion. She is not hypoxic or in any respiratory distress. Given she is already tachycardic, will order short-acting muscarinic nebulizer for her symptoms. Unclear etiology for these symptoms, as noted in today's progress note. We will continue to monitor for any new/worsening symptoms overnight.  - Atrovent nebulizer every 6 hours as needed  Evlyn Kanner, MD Internal Medicine PGY-3 Pager: [phone]

## 2022-03-22 NOTE — Progress Notes (Signed)
Limited echo to evaluate pericardial effusion

## 2022-03-22 DEATH — deceased

## 2022-03-23 DIAGNOSIS — R109 Unspecified abdominal pain: Secondary | ICD-10-CM | POA: Diagnosis not present

## 2022-03-23 DIAGNOSIS — L89312 Pressure ulcer of right buttock, stage 2: Secondary | ICD-10-CM

## 2022-03-23 DIAGNOSIS — R0682 Tachypnea, not elsewhere classified: Secondary | ICD-10-CM | POA: Diagnosis not present

## 2022-03-23 DIAGNOSIS — I3139 Other pericardial effusion (noninflammatory): Secondary | ICD-10-CM | POA: Diagnosis not present

## 2022-03-23 DIAGNOSIS — L89322 Pressure ulcer of left buttock, stage 2: Secondary | ICD-10-CM

## 2022-03-23 DIAGNOSIS — R000 Tachycardia, unspecified: Secondary | ICD-10-CM | POA: Diagnosis not present

## 2022-03-23 LAB — CBC
HCT: 30.3 % — ABNORMAL LOW (ref 36.0–46.0)
Hemoglobin: 9.4 g/dL — ABNORMAL LOW (ref 12.0–15.0)
MCH: 31.3 pg (ref 26.0–34.0)
MCHC: 31 g/dL (ref 30.0–36.0)
MCV: 101 fL — ABNORMAL HIGH (ref 80.0–100.0)
Platelets: 641 10*3/uL — ABNORMAL HIGH (ref 150–400)
RBC: 3 MIL/uL — ABNORMAL LOW (ref 3.87–5.11)
RDW: 15.6 % — ABNORMAL HIGH (ref 11.5–15.5)
WBC: 12.4 10*3/uL — ABNORMAL HIGH (ref 4.0–10.5)
nRBC: 0.2 % (ref 0.0–0.2)

## 2022-03-23 LAB — VITAMIN B12: Vitamin B-12: 746 pg/mL (ref 180–914)

## 2022-03-23 MED ORDER — POLYETHYLENE GLYCOL 3350 17 G PO PACK: 17.0000 g | PACK | Freq: Every day | ORAL | Status: DC

## 2022-03-23 MED ORDER — GERHARDT'S BUTT CREAM
TOPICAL_CREAM | Freq: Two times a day (BID) | CUTANEOUS | Status: DC
  Filled 2022-03-23: qty 1

## 2022-03-23 NOTE — Progress Notes (Signed)
   03/23/22 1813  Assess: MEWS Score  Temp 98.3 F (36.8 C)  BP 107/65  MAP (mmHg) 79  Pulse Rate (!) 112  ECG Heart Rate (!) 112  Resp 16  Level of Consciousness Alert  SpO2 90 %  O2 Device Nasal Cannula  O2 Flow Rate (L/min) 4 L/min  Assess: MEWS Score  MEWS Temp 0  MEWS Systolic 0  MEWS Pulse 2  MEWS RR 0  MEWS LOC 0  MEWS Score 2  MEWS Score Color Yellow  Assess: if the MEWS score is Yellow or Red  Were vital signs taken at a resting state? Yes  Focused Assessment Change from prior assessment (see assessment flowsheet)  Does the patient meet 2 or more of the SIRS criteria? No  MEWS guidelines implemented *See Row Information* Yes  Treat  MEWS Interventions Administered scheduled meds/treatments  Pain Scale Faces  Faces Pain Scale 0  Take Vital Signs  Increase Vital Sign Frequency  Yellow: Q 2hr X 2 then Q 4hr X 2, if remains yellow, continue Q 4hrs  Escalate  MEWS: Escalate Yellow: discuss with charge nurse/RN and consider discussing with provider and RRT  Notify: Charge Nurse/RN  Name of Charge Nurse/RN Notified Erin,RN  Date Charge Nurse/RN Notified 03/23/22  Time Charge Nurse/RN Notified 1811  Notify: Provider  Provider Name/Title Debe Coder B,MD  Date Provider Notified 03/23/22  Time Provider Notified 1810  Method of Notification Page  Notification Reason Change in status  Provider response No new orders  Date of Provider Response 03/23/22  Time of Provider Response 1811  Document  Patient Outcome Not stable and remains on department  Progress note created (see row info) Yes  Assess: SIRS CRITERIA  SIRS Temperature  0  SIRS Pulse 1  SIRS Respirations  0  SIRS WBC 1  SIRS Score Sum  2

## 2022-03-23 NOTE — Progress Notes (Signed)
   03/22/22 1948  Assess: MEWS Score  Temp 98.2 F (36.8 C)  BP 120/72  MAP (mmHg) 87  Pulse Rate (!) 116  ECG Heart Rate (!) 116  Resp (!) 22  Level of Consciousness Alert  SpO2 94 %  O2 Device Nasal Cannula  O2 Flow Rate (L/min) 4 L/min  Assess: MEWS Score  MEWS Temp 0  MEWS Systolic 0  MEWS Pulse 2  MEWS RR 1  MEWS LOC 0  MEWS Score 3  MEWS Score Color Yellow  Assess: if the MEWS score is Yellow or Red  Were vital signs taken at a resting state? Yes  Focused Assessment No change from prior assessment  Does the patient meet 2 or more of the SIRS criteria? No  MEWS guidelines implemented *See Row Information* Yes  Treat  Pain Scale 0-10  Pain Score 0  Take Vital Signs  Increase Vital Sign Frequency  Yellow: Q 2hr X 2 then Q 4hr X 2, if remains yellow, continue Q 4hrs  Escalate  MEWS: Escalate Yellow: discuss with charge nurse/RN and consider discussing with provider and RRT  Notify: Charge Nurse/RN  Name of Charge Nurse/RN Notified Mat, RN  Date Charge Nurse/RN Notified 03/22/22  Time Charge Nurse/RN Notified 2000  Document  Patient Outcome Stabilized after interventions  Progress note created (see row info) Yes  Assess: SIRS CRITERIA  SIRS Temperature  0  SIRS Pulse 1  SIRS Respirations  1  SIRS WBC 1  SIRS Score Sum  3

## 2022-03-23 NOTE — Progress Notes (Addendum)
Progress Note  Patient Name: Barbara Ward Date of Encounter: 03/23/2022  [facility] Cardiologist: Olga Millers, MD   Subjective   She complains chest pain all the time, worse with breathing. She complains abdominal pain from recent surgery. She has not been OOB since admission. She is using albuterol treatment for SOB, felt it's helping. She is a limited historian due to developmental disability.   Her mother and sister are at bedside, states patient has never complained any chest pain, has been in good spirit this morning and dance around in the bed earlier. They report that patient has no underlying lung disease. She is normally walking at home with a walker.   Inpatient Medications    Scheduled Meds:  acetaminophen  1,000 mg Oral Q6H   ALPRAZolam  0.25 mg Oral BID   atorvastatin  40 mg Oral q1800   busPIRone  15 mg Oral BID   Chlorhexidine Gluconate Cloth  6 each Topical Daily   cholecalciferol  2,000 Units Oral Daily   citalopram  40 mg Oral Daily   colchicine  0.6 mg Oral BID   enoxaparin (LOVENOX) injection  40 mg Subcutaneous Q24H   fluticasone  1 spray Each Nare QPM   levothyroxine  137 mcg Oral Q0600   lip balm   Topical BID   methocarbamol  500 mg Oral TID   polyethylene glycol  17 g Oral BID   senna  1 tablet Oral BID   sodium chloride flush  10-40 mL Intracatheter Q12H   Continuous Infusions:  sodium chloride 1.5 mL/hr at 03/22/22 0700   ondansetron (ZOFRAN) IV     PRN Meds: albuterol, alum & mag hydroxide-simeth, bisacodyl, fentaNYL (SUBLIMAZE) injection, guaiFENesin, ipratropium, magic mouthwash, menthol-cetylpyridinium, ondansetron (ZOFRAN) IV **OR** ondansetron (ZOFRAN) IV, mouth rinse, oxyCODONE, phenol, prochlorperazine, simethicone, sodium chloride flush   Vital Signs    Vitals:   03/22/22 2214 03/22/22 2306 03/23/22 0039 03/23/22 0335  BP: (!) 109/56 (!) 109/56 117/67 104/75  Pulse: (!) 110 (!) 106 96 79  Resp: 20 (!) 22 20 20   Temp: (!)  97.4 F (36.3 C)  97.6 F (36.4 C) 97.8 F (36.6 C)  TempSrc: Oral  Oral Axillary  SpO2: 94% 95% 97% 94%  Weight:      Height:        Intake/Output Summary (Last 24 hours) at 03/23/2022 0816 Last data filed at 03/23/2022 0600 Gross per 24 hour  Intake 1200 ml  Output 1000 ml  Net 200 ml       03/10/2022   11:01 AM 02/28/2018   10:01 AM  Last 3 Weights  Weight (lbs) 175 lb 175 lb  Weight (kg) 79.379 kg 79.379 kg      Telemetry    Sinus tachycardia 100s - Personally Reviewed  ECG    No new tracing today - Personally Reviewed  Physical Exam   GEN: No acute distress.   Neck: No JVD Cardiac: regular rhythm, tachycardic rate, no rub or muffled heart sound  Respiratory: Clear to auscultation bilaterally. Diminished at base. On 2LNC. Pox 98% GI: Mild tenderness of abdomen  MS: BLE with deformity, no pitting edema  Neuro:  A and O x3, having conversation appropriately, speech is altered, ? cognitive deficit  Psych: calm   Labs    High Sensitivity Troponin:   Recent Labs  Lab 03/10/22 0815 03/10/22 1004  TROPONINIHS 220* 222*      Chemistry Recent Labs  Lab 03/20/22 0410 02/22/2022 0550 03/22/22 0413  NA 137 139 137  K 4.0 3.8 4.1  CL 103 103 103  CO2 29 29 29   GLUCOSE 110* 122* 114*  BUN 15 10 13   CREATININE 0.46 0.45 0.60  CALCIUM 8.8* 9.0 8.8*  PROT  --   --  5.7*  ALBUMIN  --   --  2.0*  AST  --   --  43*  ALT  --   --  86*  ALKPHOS  --   --  218*  BILITOT  --   --  0.4  GFRNONAA >60 >60 >60  ANIONGAP 5 7 5      Lipids No results for input(s): "CHOL", "TRIG", "HDL", "LABVLDL", "LDLCALC", "CHOLHDL" in the last 168 hours.  Hematology Recent Labs  Lab 04-18-22 0550 03/22/22 0413 03/23/22 0433  WBC 16.6* 14.5* 12.4*  RBC 3.05* 3.06* 3.00*  HGB 9.4* 9.3* 9.4*  HCT 30.2* 30.8* 30.3*  MCV 99.0 100.7* 101.0*  MCH 30.8 30.4 31.3  MCHC 31.1 30.2 31.0  RDW 15.3 15.5 15.6*  PLT 664* 677* 641*    Thyroid No results for input(s): "TSH", "FREET4" in  the last 168 hours.  BNPNo results for input(s): "BNP", "PROBNP" in the last 168 hours.  DDimer No results for input(s): "DDIMER" in the last 168 hours.   Radiology    ECHOCARDIOGRAM LIMITED  Result Date: April 18, 2022    ECHOCARDIOGRAM LIMITED REPORT   Patient Name:   Barbara Ward Date of Exam: 04/18/22 Medical Rec #:  03/23/2022     Height:       56.0 in Accession #:    Alvera Singh    Weight:       175.0 lb Date of Birth:  May 29, 1971     BSA:          1.677 m Patient Age:    51 years      BP:           97/52 mmHg Patient Gender: F             HR:           99 bpm. Exam Location:  Inpatient Procedure: Limited Echo and Cardiac Doppler Indications:    Pericardial effusion I31.3  History:        Patient has prior history of Echocardiogram examinations, most                 recent 03/19/2022. Risk Factors:Sleep Apnea and Dyslipidemia.                 Thyroid disease. Recent hernia repair.  Sonographer:    [id number] RDCS Referring Phys: 10/08/1972 [facility] VINCENT IMPRESSIONS  1. There is a large, circumferential pericardial effusion. The effusion is greatest around the posterolateral part of the LV (near apex) measuring up to 2.7cm at end-diastole. There is no RV or RA diastolic collapse. There is <25% respirophasic variation of mitral E inflow velocity. IVC is incompletely visualized but appears dilated and does not vary significantly with inspiration. There is no evidence of tamponade on current study, however, progression of effusion size is significant as it is now 2.7cm and was only 0.7cm on echo on 03/10/22 and 2.3cm on 03/19/22. There is no evidence of cardiac tamponade.  2. Left ventricular ejection fraction, by estimation, is 60 to 65%. The left ventricle has normal function.  3. The right ventricular size is normal. Comparison(s): Prior images reviewed side by side. Compared to prior TTE on 07/20 and 07/29, the effusion continues to enlarge  as detailed above. FINDINGS  Left Ventricle: Left  ventricular ejection fraction, by estimation, is 60 to 65%. The left ventricle has normal function. The left ventricular internal cavity size was normal in size. Right Ventricle: The right ventricular size is normal. Pericardium: There is a large, circumferential pericardial effusion. The effusion is greatest around the posterolateral part of the LV (near apex) measuring up to 2.7cm at end-diastole. There is no RV or RA diastolic collapse. There is <25% respirophasic  variation of mitral E inflow velocity. IVC is incompletely visualized but appears dilated and does not vary significantly with inspiration. There is no evidence of tamponade on current study, however, progression of effusion size is significant as it is  now 2.7cm and was only 0.7cm on echo on 03/10/22 and 2.3cm on 03/19/22. There is no evidence of cardiac tamponade. Tricuspid Valve: Tricuspid valve regurgitation is mild. Laurance Flatten MD Electronically signed by Laurance Flatten MD Signature Date/Time: 03/12/2022/12:08:35 PM    Final     Cardiac Studies   Limited echo 02/27/2022: 1. There is a large, circumferential pericardial effusion. The effusion  is greatest around the posterolateral part of the LV (near apex) measuring  up to 2.7cm at end-diastole. There is no RV or RA diastolic collapse.  There is <25% respirophasic  variation of mitral E inflow velocity. IVC is incompletely visualized but  appears dilated and does not vary significantly with inspiration. There is  no evidence of tamponade on current study, however, progression of  effusion size is significant as it is  now 2.7cm and was only 0.7cm on echo on 03/10/22 and 2.3cm on 03/19/22.  There is no evidence of cardiac tamponade.   2. Left ventricular ejection fraction, by estimation, is 60 to 65%. The  left ventricle has normal function.   3. The right ventricular size is normal.    Limited echo 03/19/22: 1. Left ventricular ejection fraction, by estimation, is 65 to 70%.  The  left ventricle has normal function. The left ventricle has no regional  wall motion abnormalities.   2. Right ventricular systolic function is normal. The right ventricular  size is normal.   3. There is a moderate to large circumferential pericardial effusion  which measures 2.28 cm at greatest diameter posteriorly. There is<25%  rertrophasic variation in the Mitral valve E inflow velocity. No diastolic  RV collapse is present. The IVC is not  visualized. There is no definitive evidence of cardiac tamponade although  the effusion has increased in size compared to echo 03/17/22.      Limited echo 03/17/22: 1. Left ventricular ejection fraction, by estimation, is 55 to 60%. The  left ventricle has normal function. The left ventricle has no regional  wall motion abnormalities.   2. Right ventricular systolic function is mildly reduced. The right  ventricular size is normal. Tricuspid regurgitation signal is inadequate  for assessing PA pressure.   3. The inferior vena cava is normal in size with <50% respiratory  variability, suggesting right atrial pressure of 8 mmHg.   4. < 25% respirophasic variation of mitral valve E inflow velocity. No RV  diastolic collapse noted. The IVC is not significantly dilated. No  evidence for tamponade though the pericardial effusion is larger than on  the prior study. Moderate pericardial  effusion. The pericardial effusion is circumferential.      Echo 03/10/22: 1. Left ventricular ejection fraction, by estimation, is 55 to 60%. The  left ventricle has normal function. The left ventricle has no regional  wall motion abnormalities. Left ventricular diastolic parameters were  normal.   2. Right ventricular systolic function is mildly reduced. The right  ventricular size is normal.   3. Pericarial effusion measuring up to 0.7 cm. a small pericardial  effusion is present. The pericardial effusion is posterior and lateral to  the left ventricle.  There is no evidence of cardiac tamponade.   4. The mitral valve is normal in structure. No evidence of mitral valve  regurgitation. No evidence of mitral stenosis.   5. The aortic valve is tricuspid. Aortic valve regurgitation is not  visualized. No aortic stenosis is present.    Patient Profile     51 y.o. female  with a hx of recurrent diaphragmatic hernia, Prader-Willi syndrome, intellectual disability, hyperlipidemia, OSA, HLD, hypothyroidism, anxiety, anemia,  who is being seen 03/18/2022 for the evaluation of pericardial effusion following recurrent diaphragmatic hernia with SBO repair with lysis of adhesions in the mediastinum and pleura.   Assessment & Plan    Pericardial effusion - new since post-op  - CTA 03/19/22 with increasing size large pericardial effusion  - Echo from 03/07/2022 with large pericardial effusion, no evidence of tamponade at the time  - pt was started on colchicine 0.6 mg BID, does complain chest pain although family report none  - pt remains tachycardic 100-110s, BP normal, likely there is other etiology for tachycardia such as anxiety; albuterol use; infection; decondition - effusion appears high risk for pericardiocentesis, recommend conservative management with aggressive pulmonary toiler, mobilization, and close monitor for hypoxia/s/s of cardiac tamponade - repeat limited Echo pending today   SBO 2/2 incarcerated anterior diaphragmatic hernia - S/p laparoscopic repair with mesh robotic lysis of adhesions Mar 29, 2022   E coli UTI OSA on CPAP Prader-Willi syndrome Developmental disability  Hypothyroidism Lymphedema  Anemia  - per primary team    For questions or updates, please contact [facility] Please consult [email or web address] for contact info under        Signed, Cyndi Bender, NP  03/23/2022, 8:16 AM     History and all data above reviewed.  Patient examined.  I agree with the findings as above.  She seems to have less SOB today.  Some abdominal  pain. The patient exam reveals COR:RRR  ,  Lungs: Decreased breath sounds with poor inspiratory effort  ,  Abd: Positive bowel sounds, no rebound no guarding, Ext No pitting edema  .  All available labs, radiology testing, previous records reviewed. Agree with documented assessment and plan.  Pericardial effusion:  Plan is conservative management since it was not amenable to percutaneous drainage and there is no clear evidence clinically or echocardiographically of tamponade.  We will follow up echo after discharge or any objective change in clinical status.    Fayrene Fearing Akim Watkinson  12:11 PM  03/23/2022

## 2022-03-23 NOTE — Progress Notes (Signed)
Pt refusing to ambulate or get of out bed. Pt states she is afraid she will fall. Made pt aware that she will have assistance. Pt continued to refuse after much encouragement.

## 2022-03-23 NOTE — Progress Notes (Signed)
Pt refused mobility 

## 2022-03-23 NOTE — Progress Notes (Signed)
RN called about pts CPAP mask being too tight and after adjusting not fitting right. I went and assessed pt and switched her to a size small full face mask which seems to fit more comfortably. Will continue to monitor the pt as needed through the night.

## 2022-03-23 NOTE — Consult Note (Addendum)
WOC Nurse Consult Note: Reason for Consult: Consult requested for buttocks and posterior thighs. Pt has been incontinent of both stool and urine and there is a large amt unformed stool soiling the current dressings to the buttocks. There is a Purewick in place to attempt to contain the urine. Wound type: Left outer posterior thigh with red blistered linear wound; .8X4X.1cm, skin beginning to blister and peel and reveal red moist partial thickness wound. Left posterior thigh with partial thickness wound; .5X.5X.1cm, red and moist Bilat buttocks with red moist macerated skin; appearance is consistent with moisture associated skin damage and scattered areas of red moist partial thickness skin loss, each approx .3X.3X.1cm and partial thickness skin loss.   ICD-10 CM Codes for Irritant Dermatitis L24A2 - Due to fecal, urinary or dual incontinence  Dressing procedure/placement/frequency: Topical treatment orders provided for bedside nurses to perform as follows to repel moisture and protect from further injury: 1.  Apply Gerhardts cream to bilat buttocks and posterior thighs BID and PRN when turning and cleaning. 2.  Apply foam dressing to left posterior thigh and change Q 3 days or PRN soiling. Please re-consult if further assistance is needed.  Thank-you,  Cammie Mcgee MSN, RN, CWOCN, [place], CNS [phone]

## 2022-03-23 NOTE — TOC Progression Note (Signed)
Transition of Care [facility]) - Progression Note    Patient Details  Name: Barbara Ward MRN: [id number] Date of Birth: Jul 30, 1971  Transition of [facility]) CM/SW Contact  Harriet Masson, RN Phone Number: 03/23/2022, 4:45 PM  Clinical Narrative:     Sherron Monday to [name] (mother) and Lorene Dy (sister) regarding transition plans. They are still in hopes patient will ambulate and return to group home.  Physical therapy hasn't worked with patient today but there is a note in the chart that the RN attempted to ambulate the patient and she refused.  This RNCM left VM with April at group home to discuss if patient can return. Awaiting return call.  Expected Discharge Plan: Home w Home Health Services Barriers to Discharge: Continued Medical Work up  Expected Discharge Plan and Services Expected Discharge Plan: Home w Home Health Services In-house Referral: Clinical Social Work Discharge Planning Services: CM Consult   Living arrangements for the past 2 months: Group Home Expected Discharge Date: 03/15/22               DME Arranged: 3-N-1, Wheelchair manual, Hospital bed DME Agency: [employer] Date DME Agency Contacted: 03/14/22 Time DME Agency Contacted: 1345 Representative spoke with at DME Agency: Lawernce Keas HH Arranged: RN, PT, OT, Nurse's Aide HH Agency: [facility] Date [facility] Agency Contacted: 03/14/22 Time HH Agency Contacted: 1345 Representative spoke with at [facility] Agency: Angie   Social Determinants of Health (SDOH) Interventions    Readmission Risk Interventions     No data to display

## 2022-03-23 NOTE — Progress Notes (Addendum)
HD#17 Subjective:   Summary: Barbara Ward continues to report SOB that is unchanged. She required nebulizer treatment yesterday afternoon for SOB. She reports lightheadedness last night but no documentation noted. Mother and sister present at bedside and state she has been feeling well this morning. More cheerful and talkative this morning. Family was concerned about bed sores when they were moving her.   Overnight Events: none    Objective:  Vital signs in last 24 hours: Vitals:   03/23/22 0600 03/23/22 0700 03/23/22 0800 03/23/22 1217  BP:    120/65  Pulse: 77 89 99 98  Resp: (!) 22 (!) 22 (!) 34 20  Temp:   (!) 97.2 F (36.2 C) 98.2 F (36.8 C)  TempSrc:   Oral Oral  SpO2: 99% 100% 100% 94%  Weight:      Height:       Supplemental O2: Nasal Cannula SpO2: 94 % O2 Flow Rate (L/min): 3 L/min FiO2 (%): (!) 3 %   Physical Exam:  Constitutional: alert, laying upright in bed, in no acute distress HENT: normocephalic atraumatic Neck: supple Cardiovascular: regular rate and rhythm, no muffled heart sounds Pulmonary: normal work of breathing, lungs clear to ausculation bilaterally but tight Neurological: alert & oriented x 3 Skin: wounds covered with dressing noted on left lateral thigh, left buttock and right buttock Psych: normal mood and behavior  Filed Weights   03/17/2022 1101  Weight: 79.4 kg     Intake/Output Summary (Last 24 hours) at 03/23/2022 1350 Last data filed at 03/23/2022 1225 Gross per 24 hour  Intake 720 ml  Output 1400 ml  Net -680 ml   Net IO Since Admission: 3,104.31 mL [03/23/22 1350]  Pertinent Labs:    Latest Ref Rng & Units 03/23/2022    4:33 AM 03/22/2022    4:13 AM 02/23/2022    5:50 AM  CBC  WBC 4.0 - 10.5 K/uL 12.4  14.5  16.6   Hemoglobin 12.0 - 15.0 g/dL 9.4  9.3  9.4   Hematocrit 36.0 - 46.0 % 30.3  30.8  30.2   Platelets 150 - 400 K/uL 641  677  664        Latest Ref Rng & Units 03/22/2022    4:13 AM 02/27/2022    5:50 AM 03/20/2022     4:10 AM  CMP  Glucose 70 - 99 mg/dL 967  893  810   BUN 6 - 20 mg/dL 13  10  15    Creatinine 0.44 - 1.00 mg/dL  1.75  1.02   Sodium 135 - 145 mmol/L 137  139  137   Potassium 3.5 - 5.1 mmol/L 4.1  3.8  4.0   Chloride 98 - 111 mmol/L 103  103  103   CO2 22 - 32 mmol/L 29  29  29    Calcium 8.9 - 10.3 mg/dL 8.8  9.0  8.8   Total Protein 6.5 - 8.1 g/dL 5.7     Total Bilirubin 0.3 - 1.2 mg/dL 0.4     Alkaline Phos 38 - 126 U/L 218     AST 15 - 41 U/L 43     ALT 0 - 44 U/L 86       Imaging: No results found.  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 with a pertinent PMH of Prader-Willi syndrome, SBO secondary to anterior diaphragmatic hernia, who is s/p hernia repair with hospital course complicated by UTI and pericardial effusion.   Circumferential moderate to large pericardial effusion Cardiology noted for echocardiogram on 7/31 that pericardial effusioin is large but not increasing in size. No signs of tamponade. Started colchicine on 7/29. Recommends outpatient echocardiogram in 1 week.  Appreciate input from cardiology. -continue Colchicine 0.6 mg po BID for 3 months -cardiology will arrange outpatient echo in [redacted] week along with f/u appt  Tachycardia Tachypnea Causes could be multifactorial including anemia, abdominal pain, deconditioning after surgery, atelectasis, and enlarging pericardial effusion. Completed antibiotics for UTI. CTA PE was negative. PT is working with her and recommends SNF. Advised patient to continue with incentive spirometry and increase activity. She  is currently on 3L O2 via [place] but will try to taper off. Was not on O2 before.  -continue working with PT -continue incentive spirometry  Abdominal Pain Small bowel obstruction, resolved Incarcerated anterior diaphragmatic hernia, s/p laproscopic repair on 02/21/2022 Continues to have diffuse abdominal pain. Patient is having bowel movements. CT A/P reassuring. Surgery removed JP drain on 8/1.  -continue acetaminophen 1000 mg po every 6 hours -oxycodone and IV fentanyl for breakthrough severe pain -surgery recommends outpatient f/u in next few weeks  Pressure Ulcers Family reported bed sores on left and right buttocks. One on each buttock that is about stage 2. Another wound noted that was linear and superficial on left lateral thigh, possibly from laying over a cord/cable. Nursing has placed dressing over the posterior wounds. No purulent drainage noted.  -wound care consult -continue wound care management  Anemia Hemoglobin 9.4 today. Prior to surgery she was not anemic. Dropped to 8.8 on 7/28 and has been steady ~9. No signs of bleeding. Could be post-op anemia. Possibly contributing to her tachypnea and tachycardia. Will need outpatient f/u.  -will check B12  Acute complicated UTI Pan-sensitive E. Coli, treated with 3 day course of ceftriaxone. Will continue to monitor.   Hyperlipidemia -continue atorvastatin 40 mg daily  Hypothryoidism TSH 0.474 at admission -continue synthroid 137 mcg daily  Vitamin D deficiency -continue cholecalciferol  Anxiety -continue home alprazolam, BuSpar and citalopram  Diet: Normal IVF: NS,10cc/hr VTE: Enoxaparin Code: Full PT/OT recs:  SNF-short term rehab , wheelchair and hospital bed. Family Update: mother and sister present today   Dispo: Anticipated discharge to  Group Home with [facility]  in 2 days pending re-evaluation by PT to determine if group home will accept her and managing her acute conditions.    Rana Snare, DO Internal Medicine  Resident PGY-1 Please contact the on call pager after 5 pm and on weekends at [phone].

## 2022-03-24 ENCOUNTER — Inpatient Hospital Stay (HOSPITAL_COMMUNITY): Payer: Medicare Other

## 2022-03-24 ENCOUNTER — Inpatient Hospital Stay: Payer: Self-pay

## 2022-03-24 DIAGNOSIS — R0682 Tachypnea, not elsewhere classified: Secondary | ICD-10-CM | POA: Diagnosis not present

## 2022-03-24 DIAGNOSIS — R109 Unspecified abdominal pain: Secondary | ICD-10-CM | POA: Diagnosis not present

## 2022-03-24 DIAGNOSIS — R000 Tachycardia, unspecified: Secondary | ICD-10-CM | POA: Diagnosis not present

## 2022-03-24 DIAGNOSIS — I3139 Other pericardial effusion (noninflammatory): Secondary | ICD-10-CM | POA: Diagnosis not present

## 2022-03-24 LAB — PREALBUMIN: Prealbumin: 26 mg/dL (ref 18–38)

## 2022-03-24 MED ORDER — HYDROMORPHONE HCL 1 MG/ML IJ SOLN
0.5000 mg | INTRAMUSCULAR | Status: DC | PRN
  Administered 2022-03-24: 0.5 mg via INTRAVENOUS
  Filled 2022-03-24: qty 0.5

## 2022-03-24 MED ORDER — HALOPERIDOL LACTATE 5 MG/ML IJ SOLN: 0.5000 mg | INTRAMUSCULAR | Status: DC | PRN

## 2022-03-24 MED ORDER — BIOTENE DRY MOUTH MT LIQD: 15.0000 mL | OROMUCOSAL | Status: DC | PRN

## 2022-03-24 MED ORDER — DIPHENHYDRAMINE HCL 50 MG/ML IJ SOLN: 12.5000 mg | INTRAMUSCULAR | Status: DC | PRN

## 2022-03-24 MED ORDER — ACETAMINOPHEN 325 MG PO TABS: 650.0000 mg | ORAL_TABLET | Freq: Four times a day (QID) | ORAL | Status: DC | PRN

## 2022-03-24 MED ORDER — OXYCODONE HCL 5 MG PO TABS: 5.0000 mg | ORAL_TABLET | ORAL | Status: DC | PRN

## 2022-03-24 MED ORDER — ALBUTEROL SULFATE (2.5 MG/3ML) 0.083% IN NEBU: 2.5000 mg | INHALATION_SOLUTION | RESPIRATORY_TRACT | Status: DC | PRN

## 2022-03-24 MED ORDER — GLYCOPYRROLATE 1 MG PO TABS: 1.0000 mg | ORAL_TABLET | ORAL | Status: DC | PRN

## 2022-03-24 MED ORDER — ONDANSETRON HCL 4 MG/2ML IJ SOLN: 4.0000 mg | Freq: Four times a day (QID) | INTRAMUSCULAR | Status: DC | PRN

## 2022-03-24 MED ORDER — GLYCOPYRROLATE 0.2 MG/ML IJ SOLN: 0.2000 mg | INTRAMUSCULAR | Status: DC | PRN

## 2022-03-24 MED ORDER — HALOPERIDOL 0.5 MG PO TABS: 0.5000 mg | ORAL_TABLET | ORAL | Status: DC | PRN

## 2022-03-24 MED ORDER — HALOPERIDOL LACTATE 2 MG/ML PO CONC: 0.5000 mg | ORAL | Status: DC | PRN

## 2022-03-24 MED ORDER — POLYVINYL ALCOHOL 1.4 % OP SOLN: 1.0000 [drp] | Freq: Four times a day (QID) | OPHTHALMIC | Status: DC | PRN

## 2022-03-24 MED ORDER — FUROSEMIDE 10 MG/ML IJ SOLN
20.0000 mg | Freq: Once | INTRAMUSCULAR | Status: AC
  Administered 2022-03-24: 20 mg via INTRAVENOUS
  Filled 2022-03-24: qty 2

## 2022-03-24 MED ORDER — ONDANSETRON 4 MG PO TBDP: 4.0000 mg | ORAL_TABLET | Freq: Four times a day (QID) | ORAL | Status: DC | PRN

## 2022-03-24 MED ORDER — ACETAMINOPHEN 650 MG RE SUPP: 650.0000 mg | Freq: Four times a day (QID) | RECTAL | Status: DC | PRN

## 2022-03-31 ENCOUNTER — Ambulatory Visit: Payer: Medicare Other | Admitting: Physician Assistant

## 2022-04-22 NOTE — Progress Notes (Addendum)
Notified by RN for worsening respiratory distress with PICC line.  Evaluated bedside, patient appears in mildly distressed with tachypnea and diaphoresis.  Patient said that she is having hot flashes and feeling anxious.  She also endorses worsening abdominal pain.  Mother states that this happened right before lunch.    Diminished breath sound heard bilateral lung bases.  Unable to take full deep breath.  No wheezing heard. Heart is tachycardic.  Soft and nontender to palpation.  No LE edema.  Patient satting 92-93% on 5 L. BP 110/77.  Temp 97.8.  Patient has multiple reasons for worsening hypoxia and tachycardia including her large pericardial effusion and pleural effusion.  Pain can also be a factor.  No symptoms suggest cardiac tamponade at this time.  -Obtain stat chest x-ray -Given 1 dose of oxycodone 5 mg for pain.  She last received opioid yesterday. -We will reevaluate after scan.  Addendum Chest x-ray showed moderate right pleural effusion with no focal consolidation.  Patient remains hypoxic needing 5 L nasal cannula.  -Trial 1 dose IV Lasix 20 mg -Repeat echocardiogram -Will reach out to cardiology about her new oxygen requirement. -Left IJ tip is in the azygous vein.  I talked to [facility] team who placed her left IJ about possible repositioning.  They recommended another IV access given the left IJ has been in since 7/19 (> 3 weeks).  Will place order for PICC line today.  If unable to place a PICC line, will consult IR for another central line.

## 2022-04-22 NOTE — Progress Notes (Signed)
Patient complained of hot flashes,pulled oxygen out and looked in respiratory distress.Informed doctor.Ordered for chest Xray and tab.oxycodone.

## 2022-04-22 NOTE — Progress Notes (Addendum)
Progress Note  Patient Name: Barbara Ward Date of Encounter: Mar 28, 2022  [facility] Cardiologist: Kirk Ruths, MD   Subjective   Patient is eating breakfast. She states she has pain of her chest and abdomen still. She enjoyed her food. She did not work with PT yesterday and states she is willing to do some exercise today.   Inpatient Medications    Scheduled Meds:  acetaminophen  1,000 mg Oral Q6H   ALPRAZolam  0.25 mg Oral BID   atorvastatin  40 mg Oral q1800   busPIRone  15 mg Oral BID   Chlorhexidine Gluconate Cloth  6 each Topical Daily   cholecalciferol  2,000 Units Oral Daily   citalopram  40 mg Oral Daily   colchicine  0.6 mg Oral BID   enoxaparin (LOVENOX) injection  40 mg Subcutaneous Q24H   fluticasone  1 spray Each Nare QPM   Gerhardt's butt cream   Topical BID   levothyroxine  137 mcg Oral Q0600   lip balm   Topical BID   methocarbamol  500 mg Oral TID   polyethylene glycol  17 g Oral Daily   sodium chloride flush  10-40 mL Intracatheter Q12H   Continuous Infusions:  sodium chloride 1.5 mL/hr at 03/22/22 0700   ondansetron (ZOFRAN) IV     PRN Meds: albuterol, alum & mag hydroxide-simeth, bisacodyl, fentaNYL (SUBLIMAZE) injection, guaiFENesin, ipratropium, magic mouthwash, menthol-cetylpyridinium, ondansetron (ZOFRAN) IV **OR** ondansetron (ZOFRAN) IV, mouth rinse, oxyCODONE, phenol, prochlorperazine, simethicone, sodium chloride flush   Vital Signs    Vitals:   03/28/22 0200 03/28/22 0226 Mar 28, 2022 0400 2022-03-28 0606  BP:  107/66 114/86 118/88  Pulse: (!) 107 (!) 110 (!) 110 (!) 111  Resp: (!) 24 (!) 26 (!) 35 (!) 23  Temp:  98 F (36.7 C)  98.1 F (36.7 C)  TempSrc:  Oral  Oral  SpO2: 91% 92% 93% 97%  Weight:      Height:        Intake/Output Summary (Last 24 hours) at 28-Mar-2022 0819 Last data filed at 28-Mar-2022 0600 Gross per 24 hour  Intake 600 ml  Output 1000 ml  Net -400 ml       03/07/2022   11:01 AM 02/28/2018   10:01 AM  Last  3 Weights  Weight (lbs) 175 lb 175 lb  Weight (kg) 79.379 kg 79.379 kg      Telemetry    Sinus tachycardia 100-110s - Personally Reviewed  ECG    No new tracing today - Personally Reviewed  Physical Exam   GEN: No acute distress.   Neck: No JVD Cardiac: regular rhythm, tachycardic rate, no rub or muffled heart sound  Respiratory: Clear to auscultation bilaterally. Diminished at base. On 2LNC. Pox 98% GI: Mild tenderness of abdomen  MS: Small hands and feet, no pitting edema  Neuro:  A and O x3, having conversation appropriately, speech is altered, mild cognitive deficit  Psych: calm   Labs    High Sensitivity Troponin:   Recent Labs  Lab 03/10/22 0815 03/10/22 1004  TROPONINIHS 220* 222*      Chemistry Recent Labs  Lab 03/20/22 0410 02/28/2022 0550 03/22/22 0413  NA 137 139 137  K 4.0 3.8 4.1  CL 103 103 103  CO2 29 29 29   GLUCOSE 110* 122* 114*  BUN 15 10 13   CREATININE 0.46 0.45 0.60  CALCIUM 8.8* 9.0 8.8*  PROT  --   --  5.7*  ALBUMIN  --   --  2.0*  AST  --   --  43*  ALT  --   --  86*  ALKPHOS  --   --  218*  BILITOT  --   --  0.4  GFRNONAA >60 >60 >60  ANIONGAP 5 7 5      Lipids No results for input(s): "CHOL", "TRIG", "HDL", "LABVLDL", "LDLCALC", "CHOLHDL" in the last 168 hours.  Hematology Recent Labs  Lab 03/09/2022 0550 03/22/22 0413 03/23/22 0433  WBC 16.6* 14.5* 12.4*  RBC 3.05* 3.06* 3.00*  HGB 9.4* 9.3* 9.4*  HCT 30.2* 30.8* 30.3*  MCV 99.0 100.7* 101.0*  MCH 30.8 30.4 31.3  MCHC 31.1 30.2 31.0  RDW 15.3 15.5 15.6*  PLT 664* 677* 641*    Thyroid No results for input(s): "TSH", "FREET4" in the last 168 hours.  BNPNo results for input(s): "BNP", "PROBNP" in the last 168 hours.  DDimer No results for input(s): "DDIMER" in the last 168 hours.   Radiology    No results found.  Cardiac Studies   Limited echo 03/15/2022: 1. There is a large, circumferential pericardial effusion. The effusion  is greatest around the posterolateral  part of the LV (near apex) measuring  up to 2.7cm at end-diastole. There is no RV or RA diastolic collapse.  There is <25% respirophasic  variation of mitral E inflow velocity. IVC is incompletely visualized but  appears dilated and does not vary significantly with inspiration. There is  no evidence of tamponade on current study, however, progression of  effusion size is significant as it is  now 2.7cm and was only 0.7cm on echo on 03/10/22 and 2.3cm on 03/19/22.  There is no evidence of cardiac tamponade.   2. Left ventricular ejection fraction, by estimation, is 60 to 65%. The  left ventricle has normal function.   3. The right ventricular size is normal.    Limited echo 03/19/22: 1. Left ventricular ejection fraction, by estimation, is 65 to 70%. The  left ventricle has normal function. The left ventricle has no regional  wall motion abnormalities.   2. Right ventricular systolic function is normal. The right ventricular  size is normal.   3. There is a moderate to large circumferential pericardial effusion  which measures 2.28 cm at greatest diameter posteriorly. There is<25%  rertrophasic variation in the Mitral valve E inflow velocity. No diastolic  RV collapse is present. The IVC is not  visualized. There is no definitive evidence of cardiac tamponade although  the effusion has increased in size compared to echo 03/17/22.      Limited echo 03/17/22: 1. Left ventricular ejection fraction, by estimation, is 55 to 60%. The  left ventricle has normal function. The left ventricle has no regional  wall motion abnormalities.   2. Right ventricular systolic function is mildly reduced. The right  ventricular size is normal. Tricuspid regurgitation signal is inadequate  for assessing PA pressure.   3. The inferior vena cava is normal in size with <50% respiratory  variability, suggesting right atrial pressure of 8 mmHg.   4. < 25% respirophasic variation of mitral valve E inflow  velocity. No RV  diastolic collapse noted. The IVC is not significantly dilated. No  evidence for tamponade though the pericardial effusion is larger than on  the prior study. Moderate pericardial  effusion. The pericardial effusion is circumferential.      Echo 03/10/22: 1. Left ventricular ejection fraction, by estimation, is 55 to 60%. The  left ventricle has normal function. The left ventricle  has no regional  wall motion abnormalities. Left ventricular diastolic parameters were  normal.   2. Right ventricular systolic function is mildly reduced. The right  ventricular size is normal.   3. Pericarial effusion measuring up to 0.7 cm. a small pericardial  effusion is present. The pericardial effusion is posterior and lateral to  the left ventricle. There is no evidence of cardiac tamponade.   4. The mitral valve is normal in structure. No evidence of mitral valve  regurgitation. No evidence of mitral stenosis.   5. The aortic valve is tricuspid. Aortic valve regurgitation is not  visualized. No aortic stenosis is present.    Patient Profile     51 y.o. female  with a hx of recurrent diaphragmatic hernia, Prader-Willi syndrome, intellectual disability, hyperlipidemia, OSA, HLD, hypothyroidism, anxiety, anemia,  who is being seen 03/18/2022 for the evaluation of pericardial effusion following recurrent diaphragmatic hernia with SBO repair with lysis of adhesions in the mediastinum and pleura.   Assessment & Plan    Pericardial effusion - new since post-op  - CTA 03/19/22 with increasing size large pericardial effusion  - Echo from 03/13/2022 with large pericardial effusion, no evidence of tamponade at the time  - pt was started on colchicine 0.6 mg BID, does complain chest pain although family report none  - pt remains tachycardic 100-110s, BP normal, likely there is other etiology for tachycardia such as anxiety; albuterol use; infection; decondition - effusion appears high risk for  pericardiocentesis, recommend conservative management with aggressive pulmonary toilet, mobilization, and close monitor for hypoxia/s/s of cardiac tamponade - repeat limited Echo in 1 week outpatient has been arranged; follow up with cardiology arranged on 03/31/22   SBO 2/2 incarcerated anterior diaphragmatic hernia - S/p laparoscopic repair with mesh robotic lysis of adhesions 03/18/2022   E coli UTI OSA on CPAP Prader-Willi syndrome Developmental disability  Hypothyroidism Lymphedema  Anemia  - per primary team    For questions or updates, please contact [facility] Please consult [email or web address] for contact info under        Signed, Cyndi Bender, NP  2022-04-06, 8:19 AM      History and all data above reviewed.  Patient examined.  I agree with the findings as above.  She reports that she is having abdominal pain.  Eating slowly because of this.  She has stable BPs.  HR is elevated.  Reports some SOB unchanged from previous. The patient exam reveals COR:RRR  ,  Lungs: Decreased breath sounds with few basilar crackles and decreased inspiratory effort  ,  Abd: Positive bowel sounds, no rebound no guarding, Ext no edema  .  All available labs, radiology testing, previous records reviewed. Agree with documented assessment and plan.   Hypoxemia: At present there continues to be no clear indication that tachycardia or hypoxemia are related to the pericardial effusion.  We are very limited in being able to understand the contribution of this when she is not participating in mechanical activities (pulm toilet, PT, up in chair) that might be the more significant therapy to manage her decreased lung volumes and deconditioning. I continue to plan to follow this effusion without intervention pending a change clinically.    Rollene Rotunda  10:53 AM  2022/04/06

## 2022-04-22 NOTE — Progress Notes (Signed)
PT Cancellation Note  Patient Details Name: Barbara Ward MRN: [id number] DOB: Jan 16, 1971   Cancelled Treatment:    Reason Eval/Treat Not Completed: (P) Medical issues which prohibited therapy (per nsg staff, plan to transfer pt to 2H (ICU), will defer PT session due to impending transfer to higher level of care.)   Dorathy Kinsman Jesi Jurgens 2022-04-01, 4:03 PM

## 2022-04-22 NOTE — TOC Progression Note (Addendum)
Transition of Care [facility]) - Progression Note    Patient Details  Name: Barbara Ward MRN: [id number] Date of Birth: 08-15-1971  [facility] CM/SW Contact  Mearl Latin, LCSW Phone Number: 04/05/2022, 2:43 PM  Clinical Narrative:    CSW received call from patient's sister that they are having [facility] evaluate patient.  CSW contacted Hospice to ask if that was the plan but Hospice needs an official referral first. CSW reached out to MD to see if appropriate.    Expected Discharge Plan: Home w Home Health Services Barriers to Discharge: Continued Medical Work up  Expected Discharge Plan and Services Expected Discharge Plan: Home w Home Health Services In-house Referral: Clinical Social Work Discharge Planning Services: CM Consult   Living arrangements for the past 2 months: Group Home Expected Discharge Date: 03/15/22               DME Arranged: 3-N-1, Wheelchair manual, Hospital bed DME Agency: [employer] Date DME Agency Contacted: 03/14/22 Time DME Agency Contacted: 1345 Representative spoke with at DME Agency: Lawernce Keas HH Arranged: RN, PT, OT, Nurse's Aide HH Agency: [facility] Date [facility] Agency Contacted: 03/14/22 Time HH Agency Contacted: 1345 Representative spoke with at [facility] Agency: Angie   Social Determinants of Health (SDOH) Interventions    Readmission Risk Interventions     No data to display

## 2022-04-22 NOTE — Progress Notes (Signed)
Cpap no longer needed. Pt expired.

## 2022-04-22 NOTE — Plan of Care (Signed)
  Problem: Health Behavior/Discharge Planning: Goal: Ability to manage health-related needs will improve Outcome: Progressing   

## 2022-04-22 NOTE — Death Summary Note (Signed)
Name: Barbara Ward MRN: [id number] DOB: 1971/04/19 51 y.o.  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    Hospital Course: Barbara Ward is a 51 y.o. with a PMH of diaphragmatic hernia, Prader-Willi syndrome, developmental delay, who presented with abdominal pain and nausea and was admitted on 02/27/2022 for small bowel obstruction secondary to diaphragmatic hernia.  Her SBO was initially medically treated with NG tube decompression.  Her symptoms improved within a day and her diet was advanced.  She elected to undergo surgery for repair of her hernia.  Risks and benefits of the procedure were discussed with the patient and her family and following consent she underwent robotic assisted diaphragmatic hernia repair on 02/27/2022.  The operation was complicated by multiple episodes of decompensation with low blood pressures and changes in end-tidal CO2 leading to desufflation and reinsufflation following stabilization.  Mediastinal drain was placed and used to evacuate carbon dioxide and assess for bleeding.  See op note on 03/04/2022 for full details.  After completion of her procedure, Barbara Ward was transferred to the ICU for monitoring.  She required intubation and pressor support, but dialyzed over the next 4 days and was  transferred back to the floor.  Postoperatively, she had slowly uptrending leukocytosis, but remained afebrile.  Work-up for postoperative abdominal pain in setting of leukocytosis was unremarkable for bowel injury or infection.  Initially, there were no other localizing signs of infection, but she later noted increased urinary frequency and complained of rigors.  UA did show pyuria and bacteriuria.  IV ceftriaxone was started for UTI with systemic signs including rigors, tachycardia and tachypnea.  Treatment was completed after a 3-day course.  Patient was incidentally found to have a pericardial effusion during work-up for postoperative abdominal pain in the setting of leukocytosis.  Follow-up TTE showed moderately sized, circumferential pericardial effusion.  This was thought to be secondary to cardiac and pericardial manipulation during this patient's hernia surgery.  Effusion was serially visualized with multiple imaging studies showing interval growth, but no signs of tamponade.  Cardiology was consulted and recommended serial imaging and conservative management because of high risk of pericardiocentesis in this case.  Patient was started on colchicine.  On 04/17/22, the patient developed worsening respiratory distress with persistent tachycardia.  Exam findings and chest x-ray were consistent with right-sided pleural effusion as well as interval growth of pericardial effusion.  No signs of tamponade at the time of that assessment.  After discussing with cardiology, they recommended transfer to a higher level of care with possible pericardiocentesis.  The plan was discussed with the patient's family/guardians and after extensive discussion of risks and benefits of pericardiocentesis, the family decided against the procedure and escalation of care.  After further discussion, family decided that they would like to transition Barbara Ward to comfort care/hospice and the patient was transitioned to comfort  care the afternoon of 04/17/2022.  The initial plan was for the patient to be discharged to [facility] in [place], however she passed away in the hospital within a couple hours of being transitioned to comfort care measures.  Cause of death is thought to be respiratory failure secondary to pleural effusion and large pericardial effusion, however tamponade cannot be ruled out as medical assessment was stopped when patient was transitioned to comfort care.  She passed away at [facility] on Apr 19, 2022.  Family was at bedside and comforted with needs met.   Signed: Linus Galas, MD 19-Apr-2022, 8:01 PM

## 2022-04-22 NOTE — Progress Notes (Deleted)
Cardiology Office Note:    Date:  04/03/2022   ID:  Barbara Ward, DOB 1971/01/06, MRN [id number]  PCP:  Hurshel Party, NP   [facility] Providers Cardiologist:  Olga Millers, MD { Click to update primary MD,subspecialty MD or APP then REFRESH:1}    Referring MD: Hurshel Party, NP   No chief complaint on file. ***  History of Present Illness:    Barbara Ward is a 51 y.o. female with a hx of Prader-Willi syndrome, intellectual disability, hyperlipidemia, OSA on CPAP, bilateral lymphedema, anemia, thyroid disease, and recurrent diaphragmatic hernia.  She has no prior cardiac history.  She was admitted 03/19/2022 for incarcerated hernia with SBO.  She had robotic repair with mesh on 03/06/2022 that included lysis of adhesions in the mediastinum and bilateral pleura.  Pericardial effusion noted on CT scan leading to an echocardiogram that showed enlarging pericardial effusion.  Pericardial effusion was followed with serial limited echoes.  While the effusion was graded as large, interventional team did not feel this was amenable to pericardiocentesis.  She was started on 0.6 mg colchicine twice daily.  Hospitalization was complicated by UTI.   She presents today for follow up. She lives in a group home. She is here with her       Past Medical History:  Diagnosis Date   Allergic rhinitis    Hyperlipidemia    Hypokalemia    MR (mental retardation)    Prader-Willi syndrome    Sleep apnea    Thyroid disease     Past Surgical History:  Procedure Laterality Date   ESOPHAGOGASTRODUODENOSCOPY (EGD) WITH PROPOFOL N/A 02/25/2018   Procedure: ESOPHAGOGASTRODUODENOSCOPY (EGD) WITH PROPOFOL;  Surgeon: Kathi Der, MD;  Location: [facility] ENDOSCOPY;  Service: Gastroenterology;  Laterality: N/A;   HERNIA REPAIR     HIATAL HERNIA REPAIR N/A 02/27/2018   Procedure: LAPAROSCOPIC REPAIR OF HIATAL HERNIA;  Surgeon: Ovidio Kin, MD;  Location: [facility] OR;  Service: General;  Laterality: N/A;    LAPAROSCOPIC GASTROSTOMY N/A 02/27/2018   Procedure: LAPAROSCOPIC GASTROSTOMY;  Surgeon: Ovidio Kin, MD;  Location: [facility] OR;  Service: General;  Laterality: N/A;   THYROIDECTOMY     XI ROBOTIC ASSISTED HIATAL HERNIA REPAIR N/A 02/28/2022   Procedure: XI ROBOTIC ASSISTED RECURRENT DIAPHRAGMATIC HERNIA REPAIR;  Surgeon: Karie Soda, MD;  Location: [facility] OR;  Service: General;  Laterality: N/A;    Current Medications: No outpatient medications have been marked as taking for the 03/31/22 encounter (Appointment) with Marcelino Duster, PA.     Allergies:   Codeine, Tape, and Penicillins   Social History   Socioeconomic History   Marital status: Single    Spouse name: Not on file   Number of children: Not on file   Years of education: Not on file   Highest education level: Not on file  Occupational History   Not on file  Tobacco Use   Smoking status: Never   Smokeless tobacco: Never  Substance and Sexual Activity   Alcohol use: No   Drug use: No   Sexual activity: Not on file  Other Topics Concern   Not on file  Social History Narrative   Not on file   Social Determinants of Health   Financial Resource Strain: Not on file  Food Insecurity: Not on file  Transportation Needs: Not on file  Physical Activity: Not on file  Stress: Not on file  Social Connections: Not on file     Family History: The patient's ***family history  is not on file.  ROS:   Please see the history of present illness.    *** All other systems reviewed and are negative.  EKGs/Labs/Other Studies Reviewed:    The following studies were reviewed today:  Limited echo 03/01/2022: 1. There is a large, circumferential pericardial effusion. The effusion  is greatest around the posterolateral part of the LV (near apex) measuring  up to 2.7cm at end-diastole. There is no RV or RA diastolic collapse.  There is <25% respirophasic  variation of mitral E inflow velocity. IVC is incompletely visualized but   appears dilated and does not vary significantly with inspiration. There is  no evidence of tamponade on current study, however, progression of  effusion size is significant as it is  now 2.7cm and was only 0.7cm on echo on 03/10/22 and 2.3cm on 03/19/22.  There is no evidence of cardiac tamponade.   2. Left ventricular ejection fraction, by estimation, is 60 to 65%. The  left ventricle has normal function.   3. The right ventricular size is normal.      Limited echo 03/19/22: 1. Left ventricular ejection fraction, by estimation, is 65 to 70%. The  left ventricle has normal function. The left ventricle has no regional  wall motion abnormalities.   2. Right ventricular systolic function is normal. The right ventricular  size is normal.   3. There is a moderate to large circumferential pericardial effusion  which measures 2.28 cm at greatest diameter posteriorly. There is<25%  rertrophasic variation in the Mitral valve E inflow velocity. No diastolic  RV collapse is present. The IVC is not  visualized. There is no definitive evidence of cardiac tamponade although  the effusion has increased in size compared to echo 03/17/22.      Limited echo 03/17/22: 1. Left ventricular ejection fraction, by estimation, is 55 to 60%. The  left ventricle has normal function. The left ventricle has no regional  wall motion abnormalities.   2. Right ventricular systolic function is mildly reduced. The right  ventricular size is normal. Tricuspid regurgitation signal is inadequate  for assessing PA pressure.   3. The inferior vena cava is normal in size with <50% respiratory  variability, suggesting right atrial pressure of 8 mmHg.   4. < 25% respirophasic variation of mitral valve E inflow velocity. No RV  diastolic collapse noted. The IVC is not significantly dilated. No  evidence for tamponade though the pericardial effusion is larger than on  the prior study. Moderate pericardial  effusion. The  pericardial effusion is circumferential.      Echo 03/10/22: 1. Left ventricular ejection fraction, by estimation, is 55 to 60%. The  left ventricle has normal function. The left ventricle has no regional  wall motion abnormalities. Left ventricular diastolic parameters were  normal.   2. Right ventricular systolic function is mildly reduced. The right  ventricular size is normal.   3. Pericarial effusion measuring up to 0.7 cm. a small pericardial  effusion is present. The pericardial effusion is posterior and lateral to  the left ventricle. There is no evidence of cardiac tamponade.   4. The mitral valve is normal in structure. No evidence of mitral valve  regurgitation. No evidence of mitral stenosis.   5. The aortic valve is tricuspid. Aortic valve regurgitation is not  visualized. No aortic stenosis is present.  EKG:  EKG is *** ordered today.  The ekg ordered today demonstrates ***  Recent Labs: 03/06/2022: TSH 0.474 03/11/2022: Magnesium 2.0 03/22/2022: ALT  86; BUN 13; Creatinine, Ser 0.60; Potassium 4.1; Sodium 137 03/23/2022: Hemoglobin 9.4; Platelets 641  Recent Lipid Panel    Component Value Date/Time   CHOL 192 02/25/2018 0654   TRIG 69 03/09/2022 0236   HDL 67 02/25/2018 0654   CHOLHDL 2.9 02/25/2018 0654   VLDL 12 02/25/2018 0654   LDLCALC 113 (H) 02/25/2018 0654     Risk Assessment/Calculations:   {Does this patient have ATRIAL FIBRILLATION?:[phone]}       Physical Exam:    VS:  There were no vitals taken for this visit.    Wt Readings from Last 3 Encounters:  03/03/2022 175 lb (79.4 kg)  02/28/18 175 lb (79.4 kg)     GEN: *** Well nourished, well developed in no acute distress HEENT: Normal NECK: No JVD; No carotid bruits LYMPHATICS: No lymphadenopathy CARDIAC: ***RRR, no murmurs, rubs, gallops RESPIRATORY:  Clear to auscultation without rales, wheezing or rhonchi  ABDOMEN: Soft, non-tender, non-distended MUSCULOSKELETAL:  No edema; No deformity   SKIN: Warm and dry NEUROLOGIC:  Alert and oriented x 3 PSYCHIATRIC:  Normal affect   ASSESSMENT:    No diagnosis found. PLAN:    In order of problems listed above:  ***      {Are you ordering a CV Procedure (e.g. stress test, cath, DCCV, TEE, etc)?   Press F2        :[id number]}    Medication Adjustments/Labs and Tests Ordered: Current medicines are reviewed at length with the patient today.  Concerns regarding medicines are outlined above.  No orders of the defined types were placed in this encounter.  No orders of the defined types were placed in this encounter.   There are no Patient Instructions on file for this visit.   Signed, Marcelino Duster, [place]  04/20/2022 9:34 AM    [facility]

## 2022-04-22 NOTE — TOC Progression Note (Addendum)
Transition of Care [facility]) - Progression Note    Patient Details  Name: Barbara Ward MRN: [id number] Date of Birth: [birth year]  Transition of Care [facility] CM/SW Contact  Barbara Foley Jacklynn Ganong, RN Phone Number: 03/27/2022, 3:56 PM  Clinical Narrative:  Case Manager received call from Dr. Sherrilee Gilles, states that he has spoken with patient's mom,who is legal guardian and medical Power of attorney  and they have decided on full comfort care, want Hospice Consult. [facility] is preference. Dr. Sherrilee Gilles is initiating full comfort care orders and entering consult. Referral is being made by CM to [employer], Hospice Liaison.                      Expected Discharge Plan: Home w Home Health Services Barriers to Discharge: Continued Medical Work up  Expected Discharge Plan and Services Expected Discharge Plan: Home w Home Health Services In-house Referral: Clinical Social Work Discharge Planning Services: CM Consult   Living arrangements for the past 2 months: Group Home Expected Discharge Date: 03/15/22               DME Arranged: 3-N-1, Wheelchair manual, Hospital bed DME Agency: [employer] Date DME Agency Contacted: 03/14/22 Time DME Agency Contacted: 1345 Representative spoke with at DME Agency: Lawernce Keas HH Arranged: RN, PT, OT, Nurse's Aide HH Agency: [facility] Date [facility] Agency Contacted: 03/14/22 Time HH Agency Contacted: 1345 Representative spoke with at [facility] Agency: Angie   Social Determinants of Health (SDOH) Interventions    Readmission Risk Interventions     No data to display

## 2022-04-22 NOTE — Progress Notes (Signed)
HD#18 Subjective:   Summary: Barbara Ward reports no new complaints this morning. Still having SOB and diffuse abdominal pain that are unchanged. She is taking acetaminophen along with PRN oxycodone for pain. She agrees to work with PT today.   This afternoon, she reports having hot flashes and feeling worse with SOB and diffuse abdominal pain. She is diaphoretic, tachypneic and tachycardic. Mother is at bedside this afternoon.   Overnight Events: none    Objective:  Vital signs in last 24 hours: Vitals:   04/20/2022 0838 04/20/2022 1215 04/07/2022 1302 03/23/2022 1336  BP: (!) 113/58 115/64  110/77  Pulse: 98 (!) 105  (!) 102  Resp: (!) 26 (!) 27  (!) 27  Temp: 98.5 F (36.9 C) 98.2 F (36.8 C) 97.8 F (36.6 C)   TempSrc: Oral Oral Oral   SpO2: 94% 94%  93%  Weight:      Height:       Supplemental O2: Nasal Cannula SpO2: 93 % O2 Flow Rate (L/min): 5 L/min FiO2 (%): (!) 3 %   Physical Exam:  Constitutional: alert, laying upright in bed, in no acute distress HENT: normocephalic atraumatic Neck: supple Cardiovascular: tachycardia with normal rhythm, no muffled heart sounds Pulmonary: tachypnea, lungs clear to ausculation bilaterally but tight Neurological: alert & oriented x 3 Skin: warm and dry, wounds covered with dressing noted on left lateral thigh, left buttock and right buttock Psych: normal mood and behavior  Filed Weights   02/25/2022 1101  Weight: 79.4 kg     Intake/Output Summary (Last 24 hours) at 04/15/2022 1341 Last data filed at 03/23/2022 0600 Gross per 24 hour  Intake 600 ml  Output 600 ml  Net 0 ml    Net IO Since Admission: 3,104.31 mL [03/29/2022 1341]  Pertinent Labs:    Latest Ref Rng & Units 03/23/2022    4:33 AM 03/22/2022    4:13 AM 2022/04/07    5:50 AM  CBC  WBC 4.0 - 10.5 K/uL 12.4  14.5  16.6   Hemoglobin 12.0 - 15.0 g/dL 9.4  9.3  9.4   Hematocrit 36.0 - 46.0 % 30.3  30.8  30.2   Platelets 150 - 400 K/uL 641  677  664        Latest Ref  Rng & Units 03/22/2022    4:13 AM 04-07-22    5:50 AM 03/20/2022    4:10 AM  CMP  Glucose 70 - 99 mg/dL 025  427  062   BUN 6 - 20 mg/dL 13  10  15    Creatinine 0.44 - 1.00 mg/dL  3.76  2.83   Sodium 135 - 145 mmol/L 137  139  137   Potassium 3.5 - 5.1 mmol/L 4.1  3.8  4.0   Chloride 98 - 111 mmol/L 103  103  103   CO2 22 - 32 mmol/L 29  29  29    Calcium 8.9 - 10.3 mg/dL 8.8  9.0  8.8   Total Protein 6.5 - 8.1 g/dL 5.7     Total Bilirubin 0.3 - 1.2 mg/dL 0.4     Alkaline Phos 38 - 126 U/L 218     AST 15 - 41 U/L 43     ALT 0 - 44 U/L 86       Imaging: No results found.  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 with a pertinent PMH of Prader-Willi syndrome, SBO secondary to anterior diaphragmatic hernia, who is s/p hernia repair with hospital course complicated by UTI and pericardial effusion.   Circumferential moderate to large pericardial effusion Cardiology noted for echocardiogram on 7/31 that pericardial effusioin is large but not increasing in size. No signs of tamponade. Started colchicine on 7/29. Recommends outpatient echocardiogram in 1 week.  Appreciate input from cardiology. -repeat TTE and update cardiology  -continue Colchicine 0.6 mg po BID for 3 months -cardiology will arrange outpatient echo in [redacted] week along with f/u appt on 8/10  Tachycardia Tachypnea This afternoon, patient reports having hot flashes and continue SOB. On exam she was diaphoretic, shallow breathing and abdominal pain. Continues to be tachypneic and tachycardic. CXR showed pleural effusion and  atelectasis, unchanged from previous. Gave oxycodone for pain control. Causes could be multifactorial including anemia, abdominal pain, deconditioning after surgery, atelectasis, and enlarging pericardial effusion. Completed antibiotics for UTI. CTA PE was negative. PT is working with her and recommends SNF. Advised patient to continue with incentive spirometry and increase activity. -repeat TTE -continue working with PT -continue incentive spirometry  Abdominal Pain Small bowel obstruction, resolved Incarcerated anterior diaphragmatic hernia, s/p laproscopic repair on 02/19/2022 Continues to have diffuse abdominal pain. Patient is having bowel movements. CT A/P reassuring. Surgery removed JP drain on 8/1.  -continue acetaminophen 1000 mg po every 6 hours -oxycodone and IV fentanyl for breakthrough severe pain -surgery recommends outpatient f/u in next few weeks  Pressure Ulcers Family reported bed sores on left and right buttocks. One on each buttock that is about stage 2. Another wound noted that was linear and superficial on left lateral thigh, possibly from laying over a cord/cable. Nursing has placed dressing over the posterior wounds. No purulent drainage noted. Wound care consult appreciated. -continue wound care management  Anemia Prior to surgery she was not anemic. Dropped to 8.8 on 7/28 and has been steady ~9. B12 normal. No signs of bleeding. Could be post-op anemia. Possibly contributing to her tachypnea and tachycardia. Will need outpatient f/u.  -repeat CBC tomorrow  Acute complicated UTI Pan-sensitive E. Coli, treated with 3 day course of ceftriaxone. Will continue to monitor.   Hyperlipidemia -continue atorvastatin 40 mg daily  Hypothryoidism TSH 0.474 at admission -continue synthroid 137 mcg daily  Vitamin D deficiency -continue cholecalciferol  Anxiety -continue home alprazolam, BuSpar and citalopram  Diet: Normal IVF: NS,10cc/hr VTE: Enoxaparin Code:  Full PT/OT recs:  SNF-short term rehab , wheelchair and hospital bed. Family Update: mother coming this afternoon   Dispo: Anticipated discharge to  Group Home with [facility]  in 2 days pending re-evaluation by PT to determine if group home will accept her and managing her acute conditions.    Rana Snare, DO Internal Medicine Resident PGY-1 Please contact the on call pager after 5 pm and on weekends at [phone].

## 2022-04-22 NOTE — Progress Notes (Signed)
After discussion with cardiology, the decision is to transfer to a higher level of care in [facility] for possible pericardiocentesis.  After updating patient's mother, she decides not to escalate the care and would like to proceed with comfort care/hospice.  She states the reason for this decision is patient's lack of improvement since yesterday and her worsening clinical status today.  She states that patient is in pain and not breathing well, and she does not wish the patient to suffer.  We go over the definition of hospice/comfort care which is DNR and no treatment for patient's conditions.  Comfort care focuses on symptomatic management and allows peaceful/natural death.  Patient's mother understands that the pericardiocentesis may have a chance of reverse Ms. Malecha's condition and still would like to proceed with comfort care.  On reevaluation, her husband and their pastor are also in the room.  Her husband expresses frustration of patient's lack of improvement.  I explain to him that the pericardiocentesis can have a chance of improving her condition but also comes with risk including bleeding and infection.  He verbalizes understanding and would not want any invasive procedure.  He states that " she has been through enough" after her surgery.  Both mother and father agree with proceeding to comfort care.  They would like patient to go to [facility] house in [place].  After discussion with our attending and Dr. Antoine Poche with cardiology, we think the decision of comfort care is appropriate.  -End-of-life order set placed -[facility] referral to hospice house -Patient's group home liaison will fax over documentation confirming that patient's parents are legal guardians.

## 2022-04-22 NOTE — Progress Notes (Signed)
Patient refusing CPAP.

## 2022-04-22 DEATH — deceased
